# Patient Record
Sex: Female | Born: 1935 | Race: White | Hispanic: Yes | Smoking: Never smoker
Health system: Southern US, Community
[De-identification: ages and names within clinical notes are randomized; demographics above are authoritative.]

## PROBLEM LIST (undated history)

## (undated) DIAGNOSIS — J45909 Unspecified asthma, uncomplicated: Secondary | ICD-10-CM

## (undated) DIAGNOSIS — C3490 Malignant neoplasm of unspecified part of unspecified bronchus or lung: Secondary | ICD-10-CM

## (undated) DIAGNOSIS — M1990 Unspecified osteoarthritis, unspecified site: Secondary | ICD-10-CM

## (undated) DIAGNOSIS — I10 Essential (primary) hypertension: Secondary | ICD-10-CM

## (undated) HISTORY — PX: HERNIA REPAIR: SHX51

## (undated) HISTORY — DX: Essential (primary) hypertension: I10

## (undated) HISTORY — PX: APPENDECTOMY: SHX54

## (undated) HISTORY — PX: OTHER SURGICAL HISTORY: SHX169

## (undated) HISTORY — DX: Unspecified osteoarthritis, unspecified site: M19.90

---

## 2016-02-10 DIAGNOSIS — I10 Essential (primary) hypertension: Secondary | ICD-10-CM | POA: Insufficient documentation

## 2016-06-15 ENCOUNTER — Other Ambulatory Visit: Payer: Self-pay | Admitting: Nurse Practitioner

## 2016-06-15 DIAGNOSIS — E2839 Other primary ovarian failure: Secondary | ICD-10-CM

## 2016-06-15 DIAGNOSIS — Z1231 Encounter for screening mammogram for malignant neoplasm of breast: Secondary | ICD-10-CM

## 2016-07-28 ENCOUNTER — Ambulatory Visit
Admission: RE | Admit: 2016-07-28 | Discharge: 2016-07-28 | Disposition: A | Discharge status: Home / Self Care | Payer: Medicare HMO | Source: Ambulatory Visit | Attending: Nurse Practitioner | Admitting: Nurse Practitioner

## 2016-07-28 DIAGNOSIS — Z1231 Encounter for screening mammogram for malignant neoplasm of breast: Secondary | ICD-10-CM

## 2016-07-28 DIAGNOSIS — E2839 Other primary ovarian failure: Secondary | ICD-10-CM

## 2018-11-24 DIAGNOSIS — M810 Age-related osteoporosis without current pathological fracture: Secondary | ICD-10-CM | POA: Insufficient documentation

## 2019-01-31 ENCOUNTER — Emergency Department (HOSPITAL_COMMUNITY): Payer: Medicare HMO

## 2019-01-31 ENCOUNTER — Other Ambulatory Visit: Payer: Self-pay

## 2019-01-31 ENCOUNTER — Emergency Department (HOSPITAL_COMMUNITY)
Admission: EM | Admit: 2019-01-31 | Discharge: 2019-01-31 | Disposition: A | Discharge status: Home / Self Care | Payer: Medicare HMO | Attending: Emergency Medicine | Admitting: Emergency Medicine

## 2019-01-31 ENCOUNTER — Encounter (HOSPITAL_COMMUNITY): Payer: Self-pay | Admitting: Emergency Medicine

## 2019-01-31 DIAGNOSIS — J45909 Unspecified asthma, uncomplicated: Secondary | ICD-10-CM | POA: Diagnosis not present

## 2019-01-31 DIAGNOSIS — R0600 Dyspnea, unspecified: Secondary | ICD-10-CM

## 2019-01-31 DIAGNOSIS — Z20828 Contact with and (suspected) exposure to other viral communicable diseases: Secondary | ICD-10-CM | POA: Insufficient documentation

## 2019-01-31 DIAGNOSIS — R918 Other nonspecific abnormal finding of lung field: Secondary | ICD-10-CM | POA: Diagnosis not present

## 2019-01-31 DIAGNOSIS — R0602 Shortness of breath: Secondary | ICD-10-CM

## 2019-01-31 HISTORY — DX: Unspecified asthma, uncomplicated: J45.909

## 2019-01-31 LAB — URINALYSIS, ROUTINE W REFLEX MICROSCOPIC
Bilirubin Urine: NEGATIVE
Glucose, UA: NEGATIVE mg/dL
Hgb urine dipstick: NEGATIVE
Ketones, ur: NEGATIVE mg/dL
Leukocytes,Ua: NEGATIVE
Nitrite: NEGATIVE
Protein, ur: NEGATIVE mg/dL
Specific Gravity, Urine: 1.016 (ref 1.005–1.030)
pH: 7 (ref 5.0–8.0)

## 2019-01-31 LAB — CBC WITH DIFFERENTIAL/PLATELET
Abs Immature Granulocytes: 0.02 10*3/uL (ref 0.00–0.07)
Basophils Absolute: 0 10*3/uL (ref 0.0–0.1)
Basophils Relative: 1 %
Eosinophils Absolute: 0.3 10*3/uL (ref 0.0–0.5)
Eosinophils Relative: 5 %
HCT: 43.9 % (ref 36.0–46.0)
Hemoglobin: 14.1 g/dL (ref 12.0–15.0)
Immature Granulocytes: 0 %
Lymphocytes Relative: 29 %
Lymphs Abs: 1.6 10*3/uL (ref 0.7–4.0)
MCH: 29.1 pg (ref 26.0–34.0)
MCHC: 32.1 g/dL (ref 30.0–36.0)
MCV: 90.5 fL (ref 80.0–100.0)
Monocytes Absolute: 0.4 10*3/uL (ref 0.1–1.0)
Monocytes Relative: 7 %
Neutro Abs: 3.3 10*3/uL (ref 1.7–7.7)
Neutrophils Relative %: 58 %
Platelets: 300 10*3/uL (ref 150–400)
RBC: 4.85 MIL/uL (ref 3.87–5.11)
RDW: 13.9 % (ref 11.5–15.5)
WBC: 5.7 10*3/uL (ref 4.0–10.5)
nRBC: 0 % (ref 0.0–0.2)

## 2019-01-31 LAB — COMPREHENSIVE METABOLIC PANEL WITH GFR
ALT: 20 U/L (ref 0–44)
AST: 21 U/L (ref 15–41)
Albumin: 4.1 g/dL (ref 3.5–5.0)
Alkaline Phosphatase: 73 U/L (ref 38–126)
Anion gap: 10 (ref 5–15)
BUN: 19 mg/dL (ref 8–23)
CO2: 25 mmol/L (ref 22–32)
Calcium: 9.4 mg/dL (ref 8.9–10.3)
Chloride: 103 mmol/L (ref 98–111)
Creatinine, Ser: 0.81 mg/dL (ref 0.44–1.00)
GFR calc Af Amer: >60 mL/min
GFR calc non Af Amer: >60 mL/min
Glucose, Bld: 98 mg/dL (ref 70–99)
Potassium: 3.9 mmol/L (ref 3.5–5.1)
Sodium: 138 mmol/L (ref 135–145)
Total Bilirubin: 0.9 mg/dL (ref 0.3–1.2)
Total Protein: 7.2 g/dL (ref 6.5–8.1)

## 2019-01-31 LAB — BRAIN NATRIURETIC PEPTIDE: B Natriuretic Peptide: 42.7 pg/mL (ref 0.0–100.0)

## 2019-01-31 LAB — SARS CORONAVIRUS 2 BY RT PCR (HOSPITAL ORDER, PERFORMED IN ~~LOC~~ HOSPITAL LAB): SARS Coronavirus 2: NEGATIVE

## 2019-01-31 LAB — LACTIC ACID, PLASMA: Lactic Acid, Venous: 0.6 mmol/L (ref 0.5–1.9)

## 2019-01-31 LAB — TROPONIN I (HIGH SENSITIVITY)
Troponin I (High Sensitivity): 6 ng/L (ref <18)
Troponin I (High Sensitivity): 6 ng/L

## 2019-01-31 MED ORDER — IOHEXOL 350 MG/ML SOLN
100.0000 mL | Freq: Once | INTRAVENOUS | Status: AC | PRN
  Administered 2019-01-31: 18:00:00 100 mL via INTRAVENOUS

## 2019-01-31 MED ORDER — AMLODIPINE BESYLATE 5 MG PO TABS
2.5000 mg | ORAL_TABLET | Freq: Once | ORAL | Status: AC
  Administered 2019-01-31: 2.5 mg via ORAL
  Filled 2019-01-31: qty 1

## 2019-01-31 MED ORDER — SODIUM CHLORIDE (PF) 0.9 % IJ SOLN
INTRAMUSCULAR | Status: AC
  Administered 2019-01-31: 17:00:00
  Filled 2019-01-31: qty 50

## 2019-01-31 MED ORDER — IOHEXOL 300 MG/ML  SOLN: 100.0000 mL | Freq: Once | INTRAMUSCULAR | Status: DC | PRN

## 2019-01-31 MED ORDER — ALBUTEROL SULFATE HFA 108 (90 BASE) MCG/ACT IN AERS
2.0000 | INHALATION_SPRAY | Freq: Once | RESPIRATORY_TRACT | Status: AC
  Administered 2019-01-31: 2 via RESPIRATORY_TRACT
  Filled 2019-01-31: qty 6.7

## 2019-01-31 NOTE — ED Provider Notes (Signed)
[facility] DEPT Provider Note   CSN: [id number] Arrival date & time: 01/31/19  1054     History   Chief Complaint Chief Complaint  Patient presents with  . Shortness of Breath    HPI Sandra Cooper is a 85 y.o. female.     85 year old female with prior medical history as detailed below presents for evaluation of shortness of breath.  Patient reports approximately 1 week of feeling winded with exertion.  She denies associated chest pain or fever.  She denies cough.  She denies known exposure to other sick contacts.  She appears comfortable at time of my exam.  She apparently saw her primary care provider this morning was sent to the ED for evaluation.  The history is provided by the patient and medical records.  Shortness of Breath Severity:  Mild Onset quality:  Gradual Duration:  1 week Timing:  Constant Progression:  Waxing and waning Chronicity:  New Relieved by:  Nothing Worsened by:  Nothing Ineffective treatments:  None tried Associated symptoms: no chest pain and no fever     Past Medical History:  Diagnosis Date  . Asthma     There are no active problems to display for this patient.   History reviewed. No pertinent surgical history.   OB History   No obstetric history on file.      Home Medications    Prior to Admission medications   Not on File    Family History History reviewed. No pertinent family history.  Social History Social History   Tobacco Use  . Smoking status: Never Smoker  Substance Use Topics  . Alcohol use: Never    Frequency: Never  . Drug use: Never     Allergies   Patient has no allergy information on record.   Review of Systems Review of Systems  Constitutional: Negative for fever.  Respiratory: Positive for shortness of breath.   Cardiovascular: Negative for chest pain.  All other systems reviewed and are negative.    Physical Exam Updated Vital Signs BP (!) 157/96   Pulse 70    Temp 97.9 F (36.6 C) (Oral)   Resp 19   Ht 5' (1.524 m)   Wt 54.4 kg   SpO2 95%   BMI 23.44 kg/m   Physical Exam Vitals signs and nursing note reviewed.  Constitutional:      General: She is not in acute distress.    Appearance: She is well-developed.  HENT:     Head: Normocephalic and atraumatic.  Eyes:     Conjunctiva/sclera: Conjunctivae normal.     Pupils: Pupils are equal, round, and reactive to light.  Neck:     Musculoskeletal: Normal range of motion and neck supple.  Cardiovascular:     Rate and Rhythm: Normal rate and regular rhythm.     Heart sounds: Normal heart sounds.  Pulmonary:     Effort: Pulmonary effort is normal. No respiratory distress.     Breath sounds: Normal breath sounds.  Abdominal:     General: There is no distension.     Palpations: Abdomen is soft.     Tenderness: There is no abdominal tenderness.  Musculoskeletal: Normal range of motion.        General: No deformity.  Skin:    General: Skin is warm and dry.  Neurological:     General: No focal deficit present.     Mental Status: She is alert and oriented to person, place, and time.  ED Treatments / Results  Labs (all labs ordered are listed, but only abnormal results are displayed) Labs Reviewed  SARS CORONAVIRUS 2 (HOSPITAL ORDER, [facility])  CULTURE, BLOOD (ROUTINE X 2)  CULTURE, BLOOD (ROUTINE X 2)  COMPREHENSIVE METABOLIC PANEL  BRAIN NATRIURETIC PEPTIDE  CBC WITH DIFFERENTIAL/PLATELET  URINALYSIS, ROUTINE W REFLEX MICROSCOPIC  LACTIC ACID, PLASMA  TROPONIN I (HIGH SENSITIVITY)  TROPONIN I (HIGH SENSITIVITY)    EKG EKG Interpretation  Date/Time:  Tuesday January 31 2019 11:44:35 EDT Ventricular Rate:  63 PR Interval:    QRS Duration: 73 QT Interval:  449 QTC Calculation: 460 R Axis:   -6 Text Interpretation:  Sinus rhythm Prolonged PR interval Low voltage, precordial leads Abnormal R-wave progression, early transition Minimal ST  elevation, inferior leads Confirmed by Dene Gentry [phone]) on 01/31/2019 11:56:51 AM   Radiology Dg Chest Port 1 View  Result Date: 01/31/2019 CLINICAL DATA:  Increasing shortness of breath EXAM: PORTABLE CHEST 1 VIEW COMPARISON:  10/21/2015 FINDINGS: Cardiomegaly and aortic atherosclerosis. Mild scarring or atelectasis of the bilateral lung bases. There is a rounded nodular opacity over the left lung base, which does not appear to reflect a nipple shadow, measuring 2.0 cm. The visualized skeletal structures are unremarkable. IMPRESSION: 1. Mild scarring or atelectasis of the bilateral lung bases. No acute appearing airspace opacity. 2. There is a rounded nodular opacity over the left lung base, which does not appear to reflect a nipple shadow, measuring 2.0 cm and new compared to prior examination. Recommend CT to further evaluate, which can be performed on a nonemergent basis. 3.  Cardiomegaly. Electronically Signed   By: Eddie Candle M.D.   On: 01/31/2019 12:40    Procedures Procedures (including critical care time)  Medications Ordered in ED Medications - No data to display   Initial Impression / Assessment and Plan / ED Course  I have reviewed the triage vital signs and the nursing notes.  Pertinent labs & imaging results that were available during my care of the patient were reviewed by me and considered in my medical decision making (see chart for details).        MDM  Screen complete  [name] was evaluated in Emergency Department on 01/31/2019 for the symptoms described in the history of present illness. She was evaluated in the context of the global COVID-19 pandemic, which necessitated consideration that the patient might be at risk for infection with the SARS-CoV-2 virus that causes COVID-19. Institutional protocols and algorithms that pertain to the evaluation of patients at risk for COVID-19 are in a state of rapid change based on information released by regulatory bodies  including the CDC and federal and state organizations. These policies and algorithms were followed during the patient's care in the ED.  Patient is presenting for evaluation of dyspnea.   Initial labs / CXR reassuring.   CTA Chest (RO PE) pending at time of signout.   Dr. Sedonia Small aware of pending CT and disposition.    Final Clinical Impressions(s) / ED Diagnoses   Final diagnoses:  Dyspnea, unspecified type    ED Discharge Orders    None       Valarie Merino, MD 01/31/19 [phone]

## 2019-01-31 NOTE — ED Notes (Signed)
Daughter taking pt home, pt states she is in no pain and no longer has shortness of breath.

## 2019-01-31 NOTE — ED Notes (Signed)
Patient ambulated with pulse ox, saturation ranged from 92-97% RA, no acute distress, steady gait noted, no assistance needed.

## 2019-01-31 NOTE — Discharge Instructions (Addendum)
You were evaluated in the Emergency Department and after careful evaluation, we did not find any emergent condition requiring admission or further testing in the hospital.  We discussed the abnormal findings on your CT scan today.  We have contacted the oncologists to set up for close follow-up.  We have also provided you with their contact information.  Please return to the Emergency Department if you experience any worsening of your condition.  We encourage you to follow up with a primary care provider.  Thank you for allowing [place] to be a part of your care.

## 2019-01-31 NOTE — ED Triage Notes (Signed)
Patient arrived by self from doctors office. Patient c/o SOB. Patient has hx of asthma.   Patient stated they haven't been around anyone with COVID 19.   Patient appears comfortable and not in any distress.

## 2019-01-31 NOTE — ED Provider Notes (Signed)
  Provider Note MRN:  [id number]  Arrival date & time: 01/31/19    ED Course and Medical Decision Making  Assumed care from Dr. Francia Greaves at shift change.  Awaiting CTA to exclude PE.  Favoring asthma with noncompliance of meds.  Well-appearing, candidate for discharge if CT is negative.  CT is without evidence of pulmonary embolism.  CT does show question of pulmonary malignancy.  This finding discussed with patient and patient's family.  I also consulted oncology and Dr. Malachy Mood and Dr. Earlie Server have been messaged and they will facilitate close outpatient follow-up.  Patient with saturations in the low 90s while sitting at rest but without any acute distress, no increased work of breathing.  She was ambulated with pulse ox and again did very well, minimally symptomatic, oxygen saturations above 92% the entire time.  She is appropriate for discharge with close follow-up.  Final Clinical Impressions(s) / ED Diagnoses     ICD-10-CM   1. Dyspnea, unspecified type  R06.00   2. SOB (shortness of breath)  R06.02   3. Abnormal CT scan of lung  R91.8     ED Discharge Orders    None        Discharge Instructions     You were evaluated in the Emergency Department and after careful evaluation, we did not find any emergent condition requiring admission or further testing in the hospital.  We discussed the abnormal findings on your CT scan today.  We have contacted the oncologists to set up for close follow-up.  We have also provided you with their contact information.  Please return to the Emergency Department if you experience any worsening of your condition.  We encourage you to follow up with a primary care provider.  Thank you for allowing [place] to be a part of your care.      Barth Kirks. Sedonia Small, [place] [email or web address]    Maudie Flakes, MD 01/31/19 2125

## 2019-01-31 NOTE — ED Notes (Signed)
Patient transported to CT 

## 2019-01-31 NOTE — ED Notes (Signed)
Bedside commode has been placed. Pt is aware that MD needs UA sample.

## 2019-02-01 ENCOUNTER — Telehealth: Payer: Self-pay | Admitting: Oncology

## 2019-02-01 ENCOUNTER — Encounter: Payer: Self-pay | Admitting: *Deleted

## 2019-02-01 DIAGNOSIS — R918 Other nonspecific abnormal finding of lung field: Secondary | ICD-10-CM

## 2019-02-01 NOTE — Progress Notes (Signed)
Oncology Nurse Navigator Documentation  Oncology Nurse Navigator Flowsheets 02/01/2019  Navigator Location [facility]  Navigator Encounter Type Other/per Dr. Benay Spice, I completed referral to [facility] and will update their office.   Barriers/Navigation Needs Coordination of Care  Interventions Coordination of Care  Coordination of Care Other  Acuity Level 1  Time Spent with Patient 15

## 2019-02-01 NOTE — Telephone Encounter (Signed)
Returned call to patient dtr leslie at [phone]. Per [name] patient was seen in ED yesterday and they were told to call for appointment with Dr. Benay Spice re lung mass asap.  Per [name] they are already set up for an appointment to see the surgeon 9/4, however they would much rather see Dr. Benay Spice 1st even if by virtual visit before they start talking about surgery. Message to Dr. Veatrice Kells patient scheduler/lung coordinator re above message. [name] can be reached at [phone].

## 2019-02-03 ENCOUNTER — Other Ambulatory Visit: Payer: Self-pay | Admitting: *Deleted

## 2019-02-03 ENCOUNTER — Institutional Professional Consult (permissible substitution): Payer: Medicare HMO | Admitting: Thoracic Surgery (Cardiothoracic Vascular Surgery)

## 2019-02-03 ENCOUNTER — Other Ambulatory Visit: Payer: Self-pay

## 2019-02-03 ENCOUNTER — Encounter: Payer: Self-pay | Admitting: Thoracic Surgery (Cardiothoracic Vascular Surgery)

## 2019-02-03 VITALS — BP 158/79 | HR 71 | Temp 97.7°F | Resp 16 | Ht 60.0 in | Wt 110.0 lb

## 2019-02-03 DIAGNOSIS — E041 Nontoxic single thyroid nodule: Secondary | ICD-10-CM

## 2019-02-03 DIAGNOSIS — R911 Solitary pulmonary nodule: Secondary | ICD-10-CM

## 2019-02-03 DIAGNOSIS — R918 Other nonspecific abnormal finding of lung field: Secondary | ICD-10-CM

## 2019-02-03 NOTE — Progress Notes (Signed)
[address]       [place] [address]             [phone]                    Tracyann Tutton [place] Medical Record #[id number] Date of Birth: [birth year]  Referring: [name], MD Primary Care: [name], MD Primary Cardiologist: No primary care provider on file.  Chief Complaint:    Chief Complaint  Patient presents with   Lung Mass    Surgery eval, Chest CTA 01/31/19    History of Present Illness:    Sandra Cooper 85 y.o. female referred by Dr. Nelva Bush for evaluation of a left lower lobe pulmonary nodule that was found incidentally cross-sectional emergency department visit.  Per her report she had several weeks of a cough and some dyspnea on the 2 to concern for COVID infections she was instructed by her PCP to present to the emergency department for rapid screen.  This point she underwent a chest x-ray and a CT scan which did show this left lower lobe nodule.  In regards to her symptoms she does still have some exertional dyspnea.  She has regained some weight over the last several months.  Of note her husband died 1 year ago from lung cancer informing us that she did not lose she denies any neurologic symptoms but has been using a cane due to some occasional dizziness and gait instability.  Per her daughter she is able to walk 20 minutes every morning on the treadmill.    Smoking Hx: Lifelong non-smoker but significant exposure to secondhand smoke.  Her husband died 1 year ago lung cancer.  They were married for over 55 years.   Zubrod Score: At the time of surgery this patients most appropriate activity status/level should be described as: [x]     0    Normal activity, no symptoms []     1    Restricted in physical strenuous activity but ambulatory, able to do out light work []     2    Ambulatory and capable of self care, unable to do work activities, up and about               >50 % of waking hours                              []     3    Only  limited self care, in bed greater than 50% of waking hours []     4    Completely disabled, no self care, confined to bed or chair []     5    Moribund   Past Medical History:  Diagnosis Date   Arthritis    Asthma    Hypertension     Past Surgical History:  Procedure Laterality Date   APPENDECTOMY     HERNIA REPAIR      No family history on file.   Social History   Tobacco Use  Smoking Status Never Smoker    Social History   Substance and Sexual Activity  Alcohol Use Never   Frequency: Never     Allergies  Allergen Reactions   Fluoxetine Swelling    Current Outpatient Medications  Medication Sig Dispense Refill   albuterol (VENTOLIN HFA) 108 (90 Base) MCG/ACT inhaler Inhale 1 puff into the lungs every 6 (six) hours as  needed for wheezing or shortness of breath.     amLODipine (NORVASC) 5 MG tablet Take 5 mg by mouth daily.     ibuprofen (ADVIL) 200 MG tablet Take 200 mg by mouth daily as needed for headache.     losartan (COZAAR) 100 MG tablet Take 100 mg by mouth every evening.     mirtazapine (REMERON) 15 MG tablet Take 15 mg by mouth at bedtime.     Multiple Vitamin (MULTIVITAMIN) tablet Take 1 tablet by mouth daily.     No current facility-administered medications for this visit.     Review of Systems  Constitutional: Positive for weight loss. Negative for chills, diaphoresis, fever and malaise/fatigue.  HENT: Negative.   Eyes: Negative.   Respiratory: Positive for cough, shortness of breath and wheezing.   Cardiovascular: Negative for chest pain and palpitations.  Gastrointestinal: Negative.   Musculoskeletal: Negative.   Skin: Negative.   Neurological: Positive for dizziness.  Psychiatric/Behavioral: Negative.      PHYSICAL EXAMINATION: Ht 5' (1.524 m)    BMI 23.44 kg/m  Physical Exam  Constitutional: She is oriented to person, place, and time. She appears well-developed and well-nourished. No distress.  HENT:  Head:  Normocephalic and atraumatic.  Eyes: Conjunctivae and EOM are normal.  Neck: Normal range of motion. Neck supple. No tracheal deviation present.  Cardiovascular: Normal rate and regular rhythm.  No murmur heard. Respiratory: Effort normal.  Distant breath sounds Faint expiratory wheezes  GI: Soft.  Musculoskeletal: Normal range of motion.     Comments: Uses a cane for ambulation  Lymphadenopathy:    She has no cervical adenopathy.  Neurological: She is alert and oriented to person, place, and time.  Skin: Skin is warm and dry. She is not diaphoretic.    Diagnostic Studies & Laboratory data:     Recent Radiology Findings:   Ct Angio Chest Pe W And/or Wo Contrast  Result Date: 01/31/2019 CLINICAL DATA:  Shortness of breath. EXAM: CT ANGIOGRAPHY CHEST WITH CONTRAST TECHNIQUE: Multidetector CT imaging of the chest was performed using the standard protocol during bolus administration of intravenous contrast. Multiplanar CT image reconstructions and MIPs were obtained to evaluate the vascular anatomy. CONTRAST:  80 cc OMNIPAQUE IOHEXOL 350 MG/ML SOLN COMPARISON:  Chest x-ray dated 01/31/2019 FINDINGS: Cardiovascular: Satisfactory opacification of the pulmonary arteries to the segmental level. No evidence of pulmonary embolism. Normal heart size. No pericardial effusion. Extensive aortic atherosclerosis. Mediastinum/Nodes: 2.6 cm nodule in the left lobe of the thyroid gland. Right lobe appears normal. No axillary or mediastinal or hilar adenopathy. Small hiatal hernia. Trachea is normal. Lungs/Pleura: There is a poorly defined area of abnormal density in the left lower lobe laterally measuring 17 x 25 x 16 mm. There is suggestion of slight spiculation on several images. There is an area of what may be postobstructive atelectasis beyond the primary density. There is slight atelectasis at the right lung base posterolaterally. No effusions. Upper Abdomen: No acute abnormality. Musculoskeletal: No chest  wall abnormality. No acute or significant osseous findings. Review of the MIP images confirms the above findings. IMPRESSION: 1. No pulmonary emboli. 2. Ill-defined area of abnormal density in the lateral aspect of the left lower lobe with suggestion of slight spiculation on several images. This could represent an area of postobstructive atelectasis. The finding is worrisome for malignancy. This correlates with the nodular density seen on the chest x-ray dated 01/31/2019. 3. 2.6 cm nodule in the left lobe of the thyroid gland. Thyroid ultrasound recommended for  further evaluation. Aortic Atherosclerosis (ICD10-I70.0). Electronically Signed   By: Lorriane Shire M.D.   On: 01/31/2019 18:01   Dg Chest Port 1 View  Result Date: 01/31/2019 CLINICAL DATA:  Increasing shortness of breath EXAM: PORTABLE CHEST 1 VIEW COMPARISON:  10/21/2015 FINDINGS: Cardiomegaly and aortic atherosclerosis. Mild scarring or atelectasis of the bilateral lung bases. There is a rounded nodular opacity over the left lung base, which does not appear to reflect a nipple shadow, measuring 2.0 cm. The visualized skeletal structures are unremarkable. IMPRESSION: 1. Mild scarring or atelectasis of the bilateral lung bases. No acute appearing airspace opacity. 2. There is a rounded nodular opacity over the left lung base, which does not appear to reflect a nipple shadow, measuring 2.0 cm and new compared to prior examination. Recommend CT to further evaluate, which can be performed on a nonemergent basis. 3.  Cardiomegaly. Electronically Signed   By: Eddie Candle M.D.   On: 01/31/2019 12:40       I have independently reviewed the above radiology studies  and reviewed the findings with the patient.   Recent Lab Findings: Lab Results  Component Value Date   WBC 5.7 01/31/2019   HGB 14.1 01/31/2019   HCT 43.9 01/31/2019   PLT 300 01/31/2019   GLUCOSE 98 01/31/2019   ALT 20 01/31/2019   AST 21 01/31/2019   NA 138 01/31/2019   K 3.9  01/31/2019   CL 103 01/31/2019   CREATININE 0.81 01/31/2019   BUN 19 01/31/2019   CO2 25 01/31/2019     PFTs: Pending  Problem List: 2.5 cm left lower lobe pulmonary nodule. 2.6 cm left thyroid lobe nodule.  Assessment / Plan:   -year-old female with a 2.5 cm left lower lobe nodule that is concerning for malignancy. Discussion was had with her and her 3 children 2 of which were on the cell in regards to next steps for work-up and diagnosis of this nodule.  Recommended that she undergo a PET/CT along with tissue diagnosis.  Options included a CT-guided biopsy versus navigational bronchoscopy versus surgical biopsy.  The family is unsure in regards to what type of biopsy given home will contact us to let us forward.  We will also obtain pulmonary function testing as well as an MRI brain given her history of dizziness and gait instability.  My impression that the family is leaning towards undergoing a surgical biopsy with a completion lobectomy at the same time however Mrs. Hogate went hesitant.  I am reassured in her functional status to be able to recover well from the operation.  Once we have resulted the above studies I will contact order with the results.     I  spent 55 minutes with  the patient face to face and greater then 50% of the time was spent in counseling and coordination of care.    Lajuana Matte 02/03/2019 11:31 AM

## 2019-02-05 LAB — CULTURE, BLOOD (ROUTINE X 2)
Culture: NO GROWTH
Culture: NO GROWTH

## 2019-02-07 ENCOUNTER — Other Ambulatory Visit: Payer: Self-pay | Admitting: Thoracic Surgery (Cardiothoracic Vascular Surgery)

## 2019-02-07 DIAGNOSIS — R918 Other nonspecific abnormal finding of lung field: Secondary | ICD-10-CM

## 2019-02-08 ENCOUNTER — Other Ambulatory Visit (HOSPITAL_COMMUNITY): Payer: Self-pay | Admitting: Respiratory Therapy

## 2019-02-13 ENCOUNTER — Inpatient Hospital Stay (HOSPITAL_COMMUNITY): Admission: RE | Admit: 2019-02-13 | Payer: Medicare HMO | Source: Ambulatory Visit

## 2019-02-15 ENCOUNTER — Ambulatory Visit (HOSPITAL_COMMUNITY)
Admission: RE | Admit: 2019-02-15 | Discharge: 2019-02-15 | Disposition: A | Discharge status: Home / Self Care | Payer: Medicare HMO | Source: Ambulatory Visit | Attending: Thoracic Surgery (Cardiothoracic Vascular Surgery) | Admitting: Thoracic Surgery (Cardiothoracic Vascular Surgery)

## 2019-02-15 ENCOUNTER — Other Ambulatory Visit: Payer: Self-pay

## 2019-02-15 DIAGNOSIS — J439 Emphysema, unspecified: Secondary | ICD-10-CM | POA: Insufficient documentation

## 2019-02-15 DIAGNOSIS — I2510 Atherosclerotic heart disease of native coronary artery without angina pectoris: Secondary | ICD-10-CM | POA: Diagnosis not present

## 2019-02-15 DIAGNOSIS — R911 Solitary pulmonary nodule: Secondary | ICD-10-CM | POA: Diagnosis not present

## 2019-02-15 LAB — GLUCOSE, CAPILLARY: Glucose-Capillary: 80 mg/dL (ref 70–99)

## 2019-02-15 MED ORDER — FLUDEOXYGLUCOSE F - 18 (FDG) INJECTION
5.4000 | Freq: Once | INTRAVENOUS | Status: AC
  Administered 2019-02-15: 5.4 via INTRAVENOUS

## 2019-02-16 ENCOUNTER — Other Ambulatory Visit: Payer: Self-pay

## 2019-02-16 ENCOUNTER — Ambulatory Visit (HOSPITAL_COMMUNITY)
Admission: RE | Admit: 2019-02-16 | Discharge: 2019-02-16 | Disposition: A | Discharge status: Home / Self Care | Payer: Medicare HMO | Source: Ambulatory Visit | Attending: Thoracic Surgery (Cardiothoracic Vascular Surgery) | Admitting: Thoracic Surgery (Cardiothoracic Vascular Surgery)

## 2019-02-16 DIAGNOSIS — R911 Solitary pulmonary nodule: Secondary | ICD-10-CM | POA: Diagnosis not present

## 2019-02-16 LAB — PULMONARY FUNCTION TEST
DL/VA % pred: 87 %
DL/VA: 3.68 ml/min/mmHg/L
DLCO cor % pred: 77 %
DLCO cor: 11.98 ml/min/mmHg
DLCO unc % pred: 79 %
DLCO unc: 12.23 ml/min/mmHg
FEF 25-75 Post: 1.03 L/sec
FEF 25-75 Pre: 0.66 L/sec
FEF2575-%Change-Post: 55 %
FEF2575-%Pred-Post: 126 %
FEF2575-%Pred-Pre: 80 %
FEV1-%Change-Post: 11 %
FEV1-%Pred-Post: 102 %
FEV1-%Pred-Pre: 92 %
FEV1-Post: 1.3 L
FEV1-Pre: 1.17 L
FEV1FVC-%Change-Post: 8 %
FEV1FVC-%Pred-Pre: 94 %
FEV6-%Change-Post: 2 %
FEV6-%Pred-Post: 107 %
FEV6-%Pred-Pre: 104 %
FEV6-Post: 1.75 L
FEV6-Pre: 1.7 L
FEV6FVC-%Pred-Post: 107 %
FEV6FVC-%Pred-Pre: 107 %
FVC-%Change-Post: 2 %
FVC-%Pred-Post: 99 %
FVC-%Pred-Pre: 97 %
FVC-Post: 1.75 L
FVC-Pre: 1.7 L
Post FEV1/FVC ratio: 75 %
Post FEV6/FVC ratio: 100 %
Pre FEV1/FVC ratio: 69 %
Pre FEV6/FVC Ratio: 100 %
RV % pred: 268 %
RV: 6.07 L
TLC % pred: 183 %
TLC: 7.88 L

## 2019-02-16 MED ORDER — ALBUTEROL SULFATE (2.5 MG/3ML) 0.083% IN NEBU
2.5000 mg | INHALATION_SOLUTION | Freq: Once | RESPIRATORY_TRACT | Status: AC
  Administered 2019-02-16: 13:00:00 2.5 mg via RESPIRATORY_TRACT

## 2019-02-20 ENCOUNTER — Other Ambulatory Visit (HOSPITAL_COMMUNITY)
Admission: RE | Admit: 2019-02-20 | Discharge: 2019-02-20 | Disposition: A | Discharge status: Home / Self Care | Payer: Medicare HMO | Source: Ambulatory Visit | Attending: Thoracic Surgery (Cardiothoracic Vascular Surgery) | Admitting: Thoracic Surgery (Cardiothoracic Vascular Surgery)

## 2019-02-20 ENCOUNTER — Ambulatory Visit (HOSPITAL_COMMUNITY): Payer: Medicare HMO

## 2019-02-20 DIAGNOSIS — Z20828 Contact with and (suspected) exposure to other viral communicable diseases: Secondary | ICD-10-CM | POA: Insufficient documentation

## 2019-02-20 DIAGNOSIS — Z01812 Encounter for preprocedural laboratory examination: Secondary | ICD-10-CM | POA: Insufficient documentation

## 2019-02-22 ENCOUNTER — Other Ambulatory Visit: Payer: Self-pay | Admitting: Radiology

## 2019-02-22 ENCOUNTER — Telehealth: Payer: Self-pay | Admitting: *Deleted

## 2019-02-22 ENCOUNTER — Other Ambulatory Visit: Payer: Self-pay | Admitting: Physician Assistant

## 2019-02-22 LAB — NOVEL CORONAVIRUS, NAA (HOSP ORDER, SEND-OUT TO REF LAB; TAT 18-24 HRS): SARS-CoV-2, NAA: NOT DETECTED

## 2019-02-22 NOTE — Telephone Encounter (Signed)
Dr. Kipp Brood called Sandra Cooper per her request.  She reported that her blood pressures are running high.  He has ordered that her scheduled IR CT lung BX be cancelled for tomorrow, 02/23/19.  He has instructed her to see her PCP for BP management.  I called central scheduling and was assisted in cancelling her bx.

## 2019-02-23 ENCOUNTER — Ambulatory Visit (HOSPITAL_COMMUNITY): Admission: RE | Admit: 2019-02-23 | Payer: Medicare HMO | Source: Ambulatory Visit

## 2019-02-23 ENCOUNTER — Telehealth: Payer: Self-pay

## 2019-02-23 ENCOUNTER — Encounter (HOSPITAL_COMMUNITY): Payer: Self-pay

## 2019-02-23 NOTE — Telephone Encounter (Signed)
Donnella Sham, RN  Lajuana Matte, MD        You're welcome! I also want you to be aware, she called back and is wanting to know the results of her Thyroid and PET scans.   Thanks,   Caryl Pina   Previous Messages  ----- Message -----  From: Lajuana Matte, MD  Sent: 02/22/2019 12:41 PM EDT  To: Donnella Sham, RN  Subject: RE: Concerns about blood pressure and biopsy   thanks  ----- Message -----  From: Donnella Sham, RN  Sent: 02/22/2019 12:18 PM EDT  To: Lajuana Matte, MD  Subject: Concerns about blood pressure and biopsy     Hey,   She called the office concerned about her blood pressure (which has been ongoing for a couple of weeks) and having the lung biopsy done tomorrow. She stated that last night her blood pressure woke her up in the middle of the night and she checked it, 223/121. She did say she has called her PCP in regards to her blood pressure. She was not sure if she should go through with the biopsy with her BP being that high. I did tell her that they would be checking her blood pressure before and during the biopsy and could give her medication to help bring it down, and that if it was too high and unsafe they would cancel the biopsy then.   She specifically stated that she would like to speak with you. Please advise.   Her number is [phone] in case you want to contact her.    Thanks,   Caryl Pina

## 2019-02-24 ENCOUNTER — Encounter: Payer: Self-pay | Admitting: *Deleted

## 2019-02-24 ENCOUNTER — Other Ambulatory Visit: Payer: Self-pay | Admitting: *Deleted

## 2019-02-24 DIAGNOSIS — R918 Other nonspecific abnormal finding of lung field: Secondary | ICD-10-CM

## 2019-02-24 NOTE — Progress Notes (Signed)
Received a call from [name]'s daughter inquiring about her LUNG NB hat had to be cancelled due to high blood pressures.  She has been to her PCP, her bp med has ben adjusted.  He will give her an axiety med to take before the biopsy.  I called the scheduler to have it rescheduled and make note that the daughter, [name] wants to be called with all the arrangements.  She will have to have a repeat COVID test, the order has been entered and the POC will be notified of same when her NB has been rescheduled.

## 2019-03-06 ENCOUNTER — Other Ambulatory Visit: Payer: Self-pay

## 2019-03-06 ENCOUNTER — Other Ambulatory Visit (HOSPITAL_COMMUNITY)
Admission: RE | Admit: 2019-03-06 | Discharge: 2019-03-06 | Disposition: A | Discharge status: Home / Self Care | Payer: Medicare HMO | Source: Ambulatory Visit | Attending: Thoracic Surgery (Cardiothoracic Vascular Surgery) | Admitting: Thoracic Surgery (Cardiothoracic Vascular Surgery)

## 2019-03-06 DIAGNOSIS — Z01812 Encounter for preprocedural laboratory examination: Secondary | ICD-10-CM | POA: Diagnosis present

## 2019-03-06 DIAGNOSIS — Z20828 Contact with and (suspected) exposure to other viral communicable diseases: Secondary | ICD-10-CM | POA: Diagnosis not present

## 2019-03-07 LAB — NOVEL CORONAVIRUS, NAA (HOSP ORDER, SEND-OUT TO REF LAB; TAT 18-24 HRS): SARS-CoV-2, NAA: NOT DETECTED

## 2019-03-08 ENCOUNTER — Other Ambulatory Visit: Payer: Self-pay | Admitting: Student

## 2019-03-08 ENCOUNTER — Other Ambulatory Visit: Payer: Self-pay | Admitting: Physician Assistant

## 2019-03-09 ENCOUNTER — Ambulatory Visit (HOSPITAL_COMMUNITY): Payer: Medicare HMO

## 2019-03-09 ENCOUNTER — Encounter (HOSPITAL_COMMUNITY): Payer: Self-pay

## 2019-03-09 ENCOUNTER — Other Ambulatory Visit: Payer: Self-pay

## 2019-03-09 ENCOUNTER — Ambulatory Visit (HOSPITAL_COMMUNITY)
Admission: RE | Admit: 2019-03-09 | Discharge: 2019-03-09 | Disposition: A | Discharge status: Home / Self Care | Payer: Medicare HMO | Source: Ambulatory Visit | Attending: Thoracic Surgery (Cardiothoracic Vascular Surgery) | Admitting: Thoracic Surgery (Cardiothoracic Vascular Surgery)

## 2019-03-09 ENCOUNTER — Inpatient Hospital Stay (HOSPITAL_COMMUNITY)
Admission: AD | Admit: 2019-03-09 | Discharge: 2019-03-15 | DRG: 189 | Disposition: A | Discharge status: Home Health | Payer: Medicare HMO | Source: Ambulatory Visit | Attending: Internal Medicine | Admitting: Internal Medicine

## 2019-03-09 VITALS — BP 117/66 | HR 86 | Temp 98.0°F | Resp 18 | Wt 107.5 lb

## 2019-03-09 DIAGNOSIS — D62 Acute posthemorrhagic anemia: Secondary | ICD-10-CM | POA: Diagnosis present

## 2019-03-09 DIAGNOSIS — Z20828 Contact with and (suspected) exposure to other viral communicable diseases: Secondary | ICD-10-CM | POA: Diagnosis present

## 2019-03-09 DIAGNOSIS — J9601 Acute respiratory failure with hypoxia: Principal | ICD-10-CM | POA: Diagnosis present

## 2019-03-09 DIAGNOSIS — R042 Hemoptysis: Secondary | ICD-10-CM | POA: Diagnosis present

## 2019-03-09 DIAGNOSIS — I10 Essential (primary) hypertension: Secondary | ICD-10-CM | POA: Diagnosis present

## 2019-03-09 DIAGNOSIS — R918 Other nonspecific abnormal finding of lung field: Secondary | ICD-10-CM

## 2019-03-09 DIAGNOSIS — R0600 Dyspnea, unspecified: Secondary | ICD-10-CM | POA: Diagnosis present

## 2019-03-09 DIAGNOSIS — Z79899 Other long term (current) drug therapy: Secondary | ICD-10-CM

## 2019-03-09 DIAGNOSIS — F17210 Nicotine dependence, cigarettes, uncomplicated: Secondary | ICD-10-CM | POA: Diagnosis present

## 2019-03-09 DIAGNOSIS — J45909 Unspecified asthma, uncomplicated: Secondary | ICD-10-CM | POA: Diagnosis present

## 2019-03-09 DIAGNOSIS — Z7982 Long term (current) use of aspirin: Secondary | ICD-10-CM

## 2019-03-09 DIAGNOSIS — R0902 Hypoxemia: Secondary | ICD-10-CM

## 2019-03-09 DIAGNOSIS — C3432 Malignant neoplasm of lower lobe, left bronchus or lung: Secondary | ICD-10-CM | POA: Diagnosis present

## 2019-03-09 LAB — CBC
HCT: 46.6 % — ABNORMAL HIGH (ref 36.0–46.0)
Hemoglobin: 15.2 g/dL — ABNORMAL HIGH (ref 12.0–15.0)
MCH: 29.1 pg (ref 26.0–34.0)
MCHC: 32.6 g/dL (ref 30.0–36.0)
MCV: 89.3 fL (ref 80.0–100.0)
Platelets: 336 10*3/uL (ref 150–400)
RBC: 5.22 MIL/uL — ABNORMAL HIGH (ref 3.87–5.11)
RDW: 13.6 % (ref 11.5–15.5)
WBC: 5.6 10*3/uL (ref 4.0–10.5)
nRBC: 0 % (ref 0.0–0.2)

## 2019-03-09 LAB — PROTIME-INR
INR: 0.9 (ref 0.8–1.2)
Prothrombin Time: 12.5 seconds (ref 11.4–15.2)

## 2019-03-09 MED ORDER — KETOROLAC TROMETHAMINE 30 MG/ML IJ SOLN
INTRAMUSCULAR | Status: AC
  Filled 2019-03-09: qty 1

## 2019-03-09 MED ORDER — MIDAZOLAM HCL 2 MG/2ML IJ SOLN
INTRAMUSCULAR | Status: AC
  Filled 2019-03-09: qty 2

## 2019-03-09 MED ORDER — BISACODYL 5 MG PO TBEC: 5.0000 mg | DELAYED_RELEASE_TABLET | Freq: Every day | ORAL | Status: DC | PRN

## 2019-03-09 MED ORDER — HYDROCODONE-ACETAMINOPHEN 5-325 MG PO TABS: 1.0000 | ORAL_TABLET | ORAL | Status: DC | PRN

## 2019-03-09 MED ORDER — ACETAMINOPHEN 650 MG RE SUPP: 650.0000 mg | Freq: Four times a day (QID) | RECTAL | Status: DC | PRN

## 2019-03-09 MED ORDER — FENTANYL CITRATE (PF) 100 MCG/2ML IJ SOLN
INTRAMUSCULAR | Status: AC
  Filled 2019-03-09: qty 2

## 2019-03-09 MED ORDER — SENNOSIDES-DOCUSATE SODIUM 8.6-50 MG PO TABS: 1.0000 | ORAL_TABLET | Freq: Every evening | ORAL | Status: DC | PRN

## 2019-03-09 MED ORDER — ZOLPIDEM TARTRATE 5 MG PO TABS: 5.0000 mg | ORAL_TABLET | Freq: Every evening | ORAL | Status: DC | PRN

## 2019-03-09 MED ORDER — SODIUM CHLORIDE 0.9 % IV SOLN: INTRAVENOUS | Status: DC

## 2019-03-09 MED ORDER — AMLODIPINE BESYLATE 5 MG PO TABS
5.0000 mg | ORAL_TABLET | Freq: Every day | ORAL | Status: DC
  Administered 2019-03-09 – 2019-03-15 (×7): 5 mg via ORAL
  Filled 2019-03-09 (×7): qty 1

## 2019-03-09 MED ORDER — MIRTAZAPINE 15 MG PO TABS
15.0000 mg | ORAL_TABLET | Freq: Every day | ORAL | Status: DC
  Administered 2019-03-09 – 2019-03-14 (×6): 15 mg via ORAL
  Filled 2019-03-09 (×6): qty 1

## 2019-03-09 MED ORDER — KETOROLAC TROMETHAMINE 30 MG/ML IJ SOLN
15.0000 mg | Freq: Four times a day (QID) | INTRAMUSCULAR | Status: DC | PRN
  Administered 2019-03-10: 15 mg via INTRAVENOUS
  Filled 2019-03-09: qty 1

## 2019-03-09 MED ORDER — ALBUTEROL SULFATE HFA 108 (90 BASE) MCG/ACT IN AERS: 1.0000 | INHALATION_SPRAY | Freq: Four times a day (QID) | RESPIRATORY_TRACT | Status: DC | PRN

## 2019-03-09 MED ORDER — ACETAMINOPHEN 325 MG PO TABS
650.0000 mg | ORAL_TABLET | Freq: Four times a day (QID) | ORAL | Status: DC | PRN
  Administered 2019-03-10 (×2): 650 mg via ORAL
  Administered 2019-03-11: 325 mg via ORAL
  Administered 2019-03-11 – 2019-03-12 (×3): 650 mg via ORAL
  Filled 2019-03-09 (×6): qty 2

## 2019-03-09 MED ORDER — ALBUTEROL SULFATE (2.5 MG/3ML) 0.083% IN NEBU
INHALATION_SOLUTION | RESPIRATORY_TRACT | Status: AC
  Filled 2019-03-09: qty 3

## 2019-03-09 MED ORDER — ONDANSETRON HCL 4 MG/2ML IJ SOLN: 4.0000 mg | Freq: Four times a day (QID) | INTRAMUSCULAR | Status: DC | PRN

## 2019-03-09 MED ORDER — MAGNESIUM CITRATE PO SOLN: 1.0000 | Freq: Once | ORAL | Status: DC | PRN

## 2019-03-09 MED ORDER — ALBUTEROL SULFATE (2.5 MG/3ML) 0.083% IN NEBU
2.5000 mg | INHALATION_SOLUTION | Freq: Four times a day (QID) | RESPIRATORY_TRACT | Status: DC | PRN
  Administered 2019-03-10 (×2): 2.5 mg via RESPIRATORY_TRACT
  Filled 2019-03-09 (×2): qty 3

## 2019-03-09 MED ORDER — SODIUM CHLORIDE 0.9 % IV SOLN: 250.0000 mL | INTRAVENOUS | Status: DC | PRN

## 2019-03-09 MED ORDER — SODIUM CHLORIDE 0.9 % IV SOLN
INTRAVENOUS | Status: DC
  Administered 2019-03-09 – 2019-03-10 (×2): via INTRAVENOUS

## 2019-03-09 MED ORDER — FENTANYL CITRATE (PF) 100 MCG/2ML IJ SOLN
INTRAMUSCULAR | Status: AC | PRN
  Administered 2019-03-09: 50 ug via INTRAVENOUS

## 2019-03-09 MED ORDER — KETOROLAC TROMETHAMINE 15 MG/ML IJ SOLN
INTRAMUSCULAR | Status: AC | PRN
  Administered 2019-03-09: 15 mg via INTRAVENOUS

## 2019-03-09 MED ORDER — KETOROLAC TROMETHAMINE 30 MG/ML IJ SOLN: 15.0000 mg | Freq: Four times a day (QID) | INTRAMUSCULAR | Status: DC | PRN

## 2019-03-09 MED ORDER — SODIUM CHLORIDE 0.9% FLUSH: 3.0000 mL | Freq: Two times a day (BID) | INTRAVENOUS | Status: DC

## 2019-03-09 MED ORDER — SODIUM CHLORIDE 0.9% FLUSH: 3.0000 mL | INTRAVENOUS | Status: DC | PRN

## 2019-03-09 MED ORDER — MIDAZOLAM HCL 2 MG/2ML IJ SOLN
INTRAMUSCULAR | Status: AC | PRN
  Administered 2019-03-09: 1 mg via INTRAVENOUS

## 2019-03-09 MED ORDER — IBUPROFEN 400 MG PO TABS: 400.0000 mg | ORAL_TABLET | Freq: Four times a day (QID) | ORAL | Status: DC | PRN

## 2019-03-09 MED ORDER — SODIUM CHLORIDE 0.9 % IV SOLN
INTRAVENOUS | Status: AC | PRN
  Administered 2019-03-09: 250 mL via INTRAVENOUS

## 2019-03-09 MED ORDER — ONDANSETRON HCL 4 MG PO TABS: 4.0000 mg | ORAL_TABLET | Freq: Four times a day (QID) | ORAL | Status: DC | PRN

## 2019-03-09 MED ORDER — ACETAMINOPHEN 325 MG PO TABS: 650.0000 mg | ORAL_TABLET | Freq: Four times a day (QID) | ORAL | Status: DC | PRN

## 2019-03-09 MED ORDER — LOSARTAN POTASSIUM 50 MG PO TABS
100.0000 mg | ORAL_TABLET | Freq: Every evening | ORAL | Status: DC
  Administered 2019-03-09 – 2019-03-15 (×7): 100 mg via ORAL
  Filled 2019-03-09 (×7): qty 2

## 2019-03-09 MED ORDER — LIDOCAINE HCL 1 % IJ SOLN
INTRAMUSCULAR | Status: AC
  Filled 2019-03-09: qty 20

## 2019-03-09 NOTE — Progress Notes (Addendum)
Patient ID: Sandra Cooper, female   DOB: 05-27-1930, 85 y.o.   MRN: [id number]   Mild post procedure hemoptysis resolved spontaneously. Patient still requires supplemental O2 to maintain sats >90%. Left pain resolved post toradol, prob pleural irritation. F/u CXR shows line over L apex which appears to extend laterally beyond the thoracic cage suggesting skin fold, although ptx was questioned by Dr. Carlis Abbott. As she is quite stable and comfortable, we will get a f/u in another 2 hours to assess, sooner if any decompensation. Plan overnight observation, wean O2, hopefully home in the AM. I called son x2 with updates. I notified Dr. Kipp Brood of her extended observation status.

## 2019-03-09 NOTE — Procedures (Signed)
  Procedure: CT core biopsy LLL nodule   EBL:   minimal Complications:  Post op hemoptysis   See full dictation in [facility].  Dillard Cannon MD Main # [phone] Pager  [phone]

## 2019-03-09 NOTE — Progress Notes (Signed)
Chief Complaint: Patient was seen in consultation today for lung nodule biopsy at the request of [place]  Referring Physician(s): Lightfoot,Harrell O  Supervising Physician: Arne Cleveland  Patient Status: [name] - Out-pt  History of Present Illness: Sandra Cooper is a 85 y.o. female being worked up for left lung nodule. After seeing Dr. Kipp Brood and having PET scan, she is referred for CT perc biopsy to obtain tissue sampling. PMHx, meds, labs, imaging, allergies reviewed. Feels well, no recent fevers, chills, illness. Has been NPO today as directed. Family on telephone.    Past Medical History:  Diagnosis Date  . Arthritis   . Asthma   . Hypertension     Past Surgical History:  Procedure Laterality Date  . APPENDECTOMY    . HERNIA REPAIR      Allergies: Fluoxetine  Medications: Prior to Admission medications   Medication Sig Start Date End Date Taking? Authorizing Provider  albuterol (VENTOLIN HFA) 108 (90 Base) MCG/ACT inhaler Inhale 1 puff into the lungs every 6 (six) hours as needed for wheezing or shortness of breath. 09/02/18  Yes [provider]  amLODipine (NORVASC) 5 MG tablet Take 5 mg by mouth daily. 12/14/18  Yes [provider]  Calcium Carb-Cholecalciferol (CALCIUM-VITAMIN D) 600-400 MG-UNIT TABS Take 1 tablet by mouth daily.   Yes [provider]  Camphor-Menthol-Methyl Sal (SALONPAS) 3.06-06-08 % PTCH Place 1 patch onto the skin daily as needed (pain).   Yes [provider]  cholecalciferol (VITAMIN D3) 25 MCG (1000 UT) tablet Take 1,000 Units by mouth daily.   Yes [provider]  DHA-Vitamin C-Lutein (EYE HEALTH FORMULA PO) Take 1 tablet by mouth daily.   Yes [provider]  ibuprofen (ADVIL) 200 MG tablet Take 200 mg by mouth daily as needed for headache.   Yes [provider]  losartan (COZAAR) 100 MG tablet Take 100 mg by mouth every evening. 01/21/19  Yes [provider]   magnesium gluconate (MAGONATE) 500 MG tablet Take 500 mg by mouth 2 (two) times daily.   Yes [provider]  mirtazapine (REMERON) 15 MG tablet Take 15 mg by mouth at bedtime. 11/29/18  Yes [provider]  Multiple Vitamin (MULTIVITAMIN) tablet Take 1 tablet by mouth daily.   Yes [provider]  Omega-3-6-9 CAPS Take 1-2 capsules by mouth See admin instructions. 2 caps in the morning, and 1 cap in the evening   Yes [provider]  Potassium 99 MG TABS Take 99 mg by mouth daily.   Yes [provider]  vitamin B-12 (CYANOCOBALAMIN) 100 MCG tablet Take 100 mcg by mouth daily.   Yes [provider]  aspirin EC 81 MG tablet Take 81 mg by mouth daily.    [provider]     No family history on file.  Social History   Socioeconomic History  . Marital status: Widowed    Spouse name: Not on file  . Number of children: Not on file  . Years of education: Not on file  . Highest education level: Not on file  Occupational History  . Not on file  Social Needs  . Financial resource strain: Not on file  . Food insecurity    Worry: Not on file    Inability: Not on file  . Transportation needs    Medical: Not on file    Non-medical: Not on file  Tobacco Use  . Smoking status: Current Every Day Smoker    Types: Cigarettes  .  Smokeless tobacco: Never Used  Substance and Sexual Activity  . Alcohol use: Never    Frequency: Never  . Drug use: Never  . Sexual activity: Not Currently  Lifestyle  . Physical activity    Days per week: Not on file    Minutes per session: Not on file  . Stress: Not on file  Relationships  . Social Herbalist on phone: Not on file    Gets together: Not on file    Attends religious service: Not on file    Active member of club or organization: Not on file    Attends meetings of clubs or organizations: Not on file    Relationship status: Not on file  Other Topics Concern  . Not on file   Social History Narrative  . Not on file     Review of Systems: A 12 point ROS discussed and pertinent positives are indicated in the HPI above.  All other systems are negative.  Review of Systems  Vital Signs: BP (!) 167/86   Pulse 72   Temp 98.3 F (36.8 C) (Oral)   Resp 14   Ht 5' (1.524 m)   Wt 49.9 kg   SpO2 94%   BMI 21.48 kg/m   Physical Exam Constitutional:      Appearance: Normal appearance.  HENT:     Mouth/Throat:     Mouth: Mucous membranes are moist.     Pharynx: Oropharynx is clear.  Cardiovascular:     Rate and Rhythm: Normal rate and regular rhythm.     Heart sounds: Normal heart sounds.  Pulmonary:     Effort: Pulmonary effort is normal. No respiratory distress.     Breath sounds: Normal breath sounds.  Skin:    General: Skin is warm and dry.  Neurological:     General: No focal deficit present.     Mental Status: She is alert and oriented to person, place, and time.  Psychiatric:        Mood and Affect: Mood normal.        Judgment: Judgment normal.       Imaging: Nm Pet Image Initial (pi) Skull Base To Thigh  Result Date: 02/15/2019 CLINICAL DATA:  Initial treatment strategy for left lower lobe nodule. EXAM: NUCLEAR MEDICINE PET SKULL BASE TO THIGH TECHNIQUE: 5.4 mCi F-18 FDG was injected intravenously. Full-ring PET imaging was performed from the skull base to thigh after the radiotracer. CT data was obtained and used for attenuation correction and anatomic localization. Fasting blood glucose: 80 mg/dl COMPARISON:  CTA chest 01/31/2019 FINDINGS: Mediastinal blood pool activity: SUV max 2.3 Liver activity: SUV max NA NECK: No areas of abnormal hypermetabolism. Incidental CT findings: No cervical adenopathy. Bilateral carotid atherosclerosis. CHEST: Left axillary hypermetabolism including at a S.U.V. max of 2.2, corresponding to tiny nodes on 49/4. There is also low-level hypermetabolism within both hila, without adenopathy. The irregular, partially  cavitary lateral left lower lobe pulmonary nodule measures 1.8 x 1.5 cm and a S.U.V. max of 2.3 on 45/8. Compare 2.1 x 1.7 cm on the prior exam. Morphologically appears similar. Incidental CT findings: Mild centrilobular emphysema. A 3 mm lingular nodule is unchanged on 45/8. Aortic and coronary artery atherosclerosis. ABDOMEN/PELVIS: No abdominopelvic parenchymal or nodal hypermetabolism. Incidental CT findings: Normal adrenal glands. Well-circumscribed low-density liver lesions are likely cysts. Abdominal aortic atherosclerosis. Large colonic stool burden. Pelvic floor laxity. SKELETON: No abnormal marrow activity. Incidental CT findings: Osteopenia. IMPRESSION: 1. Low-level, indeterminate hypermetabolism corresponding  to the left lower lobe pulmonary nodule. Although this appears morphologically similar, it measures slightly smaller today. Especially if the patient has infectious symptoms, consider antibiotic therapy and CT follow-up at 6-12 weeks. If no infectious symptoms, potential clinical strategies include tissue sampling versus CT follow-up at 3 months. 2. Thoracic nodal hypermetabolism, favored to be reactive. 3. Aortic atherosclerosis (ICD10-I70.0), coronary artery atherosclerosis and emphysema (ICD10-J43.9). Electronically Signed   By: Abigail Miyamoto M.D.   On: 02/15/2019 13:05   US Thyroid  Result Date: 02/15/2019 CLINICAL DATA:  85 year old female with a thyroid nodule EXAM: THYROID ULTRASOUND TECHNIQUE: Ultrasound examination of the thyroid gland and adjacent soft tissues was performed. COMPARISON:  None. FINDINGS: Parenchymal Echotexture: Normal Isthmus: 3.4 cm Right lobe: 3.6 cm x 1.4 cm x 1.1 cm Left lobe: 4.2 cm x 1.8 cm x 1.7 cm _________________________________________________________ Estimated total number of nodules >/= 1 cm: 1 Number of spongiform nodules >/=  2 cm not described below (TR1): 0 Number of mixed cystic and solid nodules >/= 1.5 cm not described below (TR2): 0  _________________________________________________________ Nodule # 1: Location: Left; Inferior Maximum size: 2.6 cm; Other 2 dimensions: 2.1 cm x 2.1 cm Composition: mixed cystic and solid (1) Echogenicity: isoechoic (1) Shape: taller-than-wide (3) Margins: smooth (0) Echogenic foci: none (0) ACR TI-RADS total points: 5. ACR TI-RADS risk category: TR4 (4-6 points). ACR TI-RADS recommendations: Nodule meets criteria for biopsy _________________________________________________________ No adenopathy IMPRESSION: Left inferior thyroid nodule (labeled 1, TR 4) meets criteria for biopsy, as designated by the newly established ACR TI-RADS criteria, and referral for biopsy is recommended. Recommendations follow those established by the new ACR TI-RADS criteria (J Am Coll Radiol 9628;36:629-476). Electronically Signed   By: Corrie Mckusick D.O.   On: 02/15/2019 09:31    Labs:  CBC: Recent Labs    01/31/19 1221 03/09/19 0929  WBC 5.7 5.6  HGB 14.1 15.2*  HCT 43.9 46.6*  PLT 300 336    COAGS: Recent Labs    03/09/19 0929  INR 0.9    BMP: Recent Labs    01/31/19 1221  NA 138  K 3.9  CL 103  CO2 25  GLUCOSE 98  BUN 19  CALCIUM 9.4  CREATININE 0.81  GFRNONAA >60  GFRAA >60    LIVER FUNCTION TESTS: Recent Labs    01/31/19 1221  BILITOT 0.9  AST 21  ALT 20  ALKPHOS 73  PROT 7.2  ALBUMIN 4.1    TUMOR MARKERS: No results for input(s): AFPTM, CEA, CA199, CHROMGRNA in the last 8760 hours.  Assessment and Plan: Left lung nodule For CT guided biopsy Labs ok Risks and benefits of CT guided lung nodule biopsy was discussed with the patient including, but not limited to bleeding, hemoptysis, respiratory failure requiring intubation, infection, pneumothorax requiring chest tube placement, stroke from air embolism or even death.  All of the patient's questions were answered and the patient is agreeable to proceed.  Consent signed and in chart.     Thank you for this interesting  consult.  I greatly enjoyed meeting Davis Vannatter and look forward to participating in their care.  A copy of this report was sent to the requesting provider on this date.  Electronically Signed: Ascencion Dike, PA-C 03/09/2019, 11:01 AM   I spent a total of 20 minutes in face to face in clinical consultation, greater than 50% of which was counseling/coordinating care for lung nodule biopsy

## 2019-03-09 NOTE — Sedation Documentation (Addendum)
Pt placed on simple mask at 6L

## 2019-03-09 NOTE — Sedation Documentation (Signed)
Pt awake and alert. States she feels much better. Changed her from non-rebreather to 4L [place]. With Sa02 92-93%. Pt in bed on left side texting.

## 2019-03-09 NOTE — Progress Notes (Signed)
Patient with significant hemoptysis following lung biopsy today. Hemopytsis was self-limiting and resolved quickly, however patient desatted requiring supplemental O2.  O2 decreased to 77% during procedure with initial improvement to 88% on non-rebreather. Anesthesia called to bedside for possible intubation, however patient remained stable, alert, oriented, without respiratory distress.  No intubation required.   CXR showed: No pneumothorax status post left lung biopsy. Slightly increased left basilar density is noted which may represent pulmonary hemorrhage secondary to biopsy.  Patient currently remains stable on non-rebreather with improved oxygenation at 97%.  Vital signs otherwise stable.  BP 106/67, HR 77, resting comfortably.  However, now with difficulty weaning from NRB. When placed on 4L [place], patient desats to 88%.   Discussed with Dr. Vernard Gambles.  Repeat CXR now.  Admit for observation overnight.    Brynda Greathouse, MS RD PA-C 2:35 PM

## 2019-03-09 NOTE — Sedation Documentation (Signed)
Pt sp02 to 6 02 increased to 8L

## 2019-03-09 NOTE — Significant Event (Addendum)
Rapid Response Event Note  Overview: Time Called: 1219 Arrival Time: 1223 Event Type: Respiratory  Initial Focused Assessment: Patient sp Lung biopsy. Per staff: she had hemoptysis, about 100cc blood in suction canister.  De sat into the 70s.  Upon my arrival she is no longer coughing up blood.  PCXR has been done.  No pneumo per radiologist.  Patient on NRB O2 sats 86-88% BP 138/83  HR 94  RR 32  Interventions: Assisted to lying on her left side. Lung sounds tight, hx asthma: albuterol tx given. Continued 100% NRB,  O2 sats improved to 94%. BP 105/69  HR 85  RR 20 Transferred to radiology nursing station. Patient continues to complain of left chest pain.  Toradol given IV No additional hemoptysis,  Pt comfortable lying on her left side.  1730  Weaning O2, pt on [place] 4L   Plan of Care (if not transferred): RN to call if O2 needs increase or if additional hemoptysis  Event Summary: Name of Physician Notified: Dr Vernard Gambles at bedside at      at    Outcome: Other (Comment)(plan admit)  Event End Time: [place]  Raliegh Ip

## 2019-03-09 NOTE — Sedation Documentation (Signed)
Pt with Sa02 88-89% on 4L [place]. Placed back on Non rebreather with Sa02 94-95%. Pt in no distress. No hemoptysis.

## 2019-03-09 NOTE — Sedation Documentation (Signed)
Pt coughing up blood, suctioned, placed on 15L NRB turned to Left side.  MD at bedside pt awake coughing

## 2019-03-09 NOTE — Sedation Documentation (Signed)
Pt changed to 4L [place]. Awake and alert. In no distress. Sa02 93%

## 2019-03-09 NOTE — Discharge Instructions (Addendum)
Needle Biopsy, Care After °These instructions tell you how to care for yourself after your procedure. Your doctor may also give you more specific instructions. Call your doctor if you have any problems or questions. °What can I expect after the procedure? °After the procedure, it is common to have: °· Soreness. °· Bruising. °· Mild pain. °Follow these instructions at home: ° °· Return to your normal activities as told by your doctor. Ask your doctor what activities are safe for you. °· Take over-the-counter and prescription medicines only as told by your doctor. °· Wash your hands with soap and water before you change your bandage (dressing). If you cannot use soap and water, use hand sanitizer. °· Follow instructions from your doctor about: °? How to take care of your puncture site. °? When and how to change your bandage. °? When to remove your bandage. °· Check your puncture site every day for signs of infection. Watch for: °? Redness, swelling, or pain. °? Fluid or blood.  °? Pus or a bad smell. °? Warmth. °· Do not take baths, swim, or use a hot tub until your doctor approves. Ask your doctor if you may take showers. You may only be allowed to take sponge baths. °· Keep all follow-up visits as told by your doctor. This is important. °Contact a doctor if you have: °· A fever. °· Redness, swelling, or pain at the puncture site, and it lasts longer than a few days. °· Fluid, blood, or pus coming from the puncture site. °· Warmth coming from the puncture site. °Get help right away if: °· You have a lot of bleeding from the puncture site. °Summary °· After the procedure, it is common to have soreness, bruising, or mild pain at the puncture site. °· Check your puncture site every day for signs of infection, such as redness, swelling, or pain. °· Get help right away if you have severe bleeding from your puncture site. °This information is not intended to replace advice given to you by your health care provider. Make  sure you discuss any questions you have with your health care provider. °Document Released: 04/30/2008 Document Revised: 05/31/2017 Document Reviewed: 05/31/2017 °Elsevier Patient Education © 2020 [employer] °Moderate Conscious Sedation, Adult, Care After °These instructions provide you with information about caring for yourself after your procedure. Your health care provider may also give you more specific instructions. Your treatment has been planned according to current medical practices, but problems sometimes occur. Call your health care provider if you have any problems or questions after your procedure. °What can I expect after the procedure? °After your procedure, it is common: °· To feel sleepy for several hours. °· To feel clumsy and have poor balance for several hours. °· To have poor judgment for several hours. °· To vomit if you eat too soon. °Follow these instructions at home: °For at least 24 hours after the procedure: ° °· Do not: °? Participate in activities where you could fall or become injured. °? Drive. °? Use heavy machinery. °? Drink alcohol. °? Take sleeping pills or medicines that cause drowsiness. °? Make important decisions or sign legal documents. °? Take care of children on your own. °· Rest. °Eating and drinking °· Follow the diet recommended by your health care provider. °· If you vomit: °? Drink water, juice, or soup when you can drink without vomiting. °? Make sure you have little or no nausea before eating solid foods. °General instructions °· Have a responsible adult stay with   you until you are awake and alert. °· Take over-the-counter and prescription medicines only as told by your health care provider. °· If you smoke, do not smoke without supervision. °· Keep all follow-up visits as told by your health care provider. This is important. °Contact a health care provider if: °· You keep feeling nauseous or you keep vomiting. °· You feel light-headed. °· You develop a rash. °· You  have a fever. °Get help right away if: °· You have trouble breathing. °This information is not intended to replace advice given to you by your health care provider. Make sure you discuss any questions you have with your health care provider. °Document Released: 03/08/2013 Document Revised: 04/30/2017 Document Reviewed: 09/07/2015 °Elsevier Patient Education © 2020 [employer] ° °

## 2019-03-10 ENCOUNTER — Observation Stay (HOSPITAL_COMMUNITY): Payer: Medicare HMO

## 2019-03-10 DIAGNOSIS — R0600 Dyspnea, unspecified: Secondary | ICD-10-CM | POA: Diagnosis not present

## 2019-03-10 DIAGNOSIS — D62 Acute posthemorrhagic anemia: Secondary | ICD-10-CM | POA: Diagnosis present

## 2019-03-10 DIAGNOSIS — J45901 Unspecified asthma with (acute) exacerbation: Secondary | ICD-10-CM | POA: Diagnosis not present

## 2019-03-10 DIAGNOSIS — F17210 Nicotine dependence, cigarettes, uncomplicated: Secondary | ICD-10-CM | POA: Diagnosis present

## 2019-03-10 DIAGNOSIS — R042 Hemoptysis: Secondary | ICD-10-CM

## 2019-03-10 DIAGNOSIS — J45909 Unspecified asthma, uncomplicated: Secondary | ICD-10-CM | POA: Diagnosis present

## 2019-03-10 DIAGNOSIS — Z7189 Other specified counseling: Secondary | ICD-10-CM | POA: Diagnosis not present

## 2019-03-10 DIAGNOSIS — R0902 Hypoxemia: Secondary | ICD-10-CM | POA: Diagnosis not present

## 2019-03-10 DIAGNOSIS — C3432 Malignant neoplasm of lower lobe, left bronchus or lung: Secondary | ICD-10-CM | POA: Diagnosis present

## 2019-03-10 DIAGNOSIS — R0602 Shortness of breath: Secondary | ICD-10-CM | POA: Diagnosis not present

## 2019-03-10 DIAGNOSIS — Z79899 Other long term (current) drug therapy: Secondary | ICD-10-CM | POA: Diagnosis not present

## 2019-03-10 DIAGNOSIS — C3492 Malignant neoplasm of unspecified part of left bronchus or lung: Secondary | ICD-10-CM | POA: Diagnosis not present

## 2019-03-10 DIAGNOSIS — Z20828 Contact with and (suspected) exposure to other viral communicable diseases: Secondary | ICD-10-CM | POA: Diagnosis present

## 2019-03-10 DIAGNOSIS — O85 Puerperal sepsis: Secondary | ICD-10-CM | POA: Diagnosis not present

## 2019-03-10 DIAGNOSIS — J9601 Acute respiratory failure with hypoxia: Secondary | ICD-10-CM | POA: Diagnosis present

## 2019-03-10 DIAGNOSIS — I10 Essential (primary) hypertension: Secondary | ICD-10-CM | POA: Diagnosis present

## 2019-03-10 DIAGNOSIS — Z7982 Long term (current) use of aspirin: Secondary | ICD-10-CM | POA: Diagnosis not present

## 2019-03-10 DIAGNOSIS — R05 Cough: Secondary | ICD-10-CM | POA: Diagnosis not present

## 2019-03-10 LAB — SURGICAL PATHOLOGY

## 2019-03-10 LAB — HEMOGLOBIN AND HEMATOCRIT, BLOOD
HCT: 35.1 % — ABNORMAL LOW (ref 36.0–46.0)
Hemoglobin: 11.5 g/dL — ABNORMAL LOW (ref 12.0–15.0)

## 2019-03-10 MED ORDER — IPRATROPIUM-ALBUTEROL 0.5-2.5 (3) MG/3ML IN SOLN
3.0000 mL | Freq: Four times a day (QID) | RESPIRATORY_TRACT | Status: DC
  Administered 2019-03-10: 3 mL via RESPIRATORY_TRACT
  Filled 2019-03-10: qty 3

## 2019-03-10 MED ORDER — IPRATROPIUM-ALBUTEROL 0.5-2.5 (3) MG/3ML IN SOLN: 3.0000 mL | Freq: Four times a day (QID) | RESPIRATORY_TRACT | Status: DC | PRN

## 2019-03-10 NOTE — Progress Notes (Signed)
Attempted to call son, [name] x2 this afternoon.  No answer.  Also attempted to call daughter with no answer.   Brynda Greathouse, MS RD PA-C

## 2019-03-10 NOTE — Progress Notes (Signed)
Pt and family anxious about elevation of care,requesting MD to call daughter and update.MD text paged

## 2019-03-10 NOTE — Progress Notes (Signed)
Referring Physician(s): Hassell,Daniel  Supervising Physician: Arne Cleveland  Patient Status:  [name] [place] - In-pt  Chief Complaint: Hemoptysis   Subjective:  Patient has been assessed multiple times throughout the day by this PA. She is overall improved from her condition yesterday s/p biopsy however she has continued to require supplemental oxygen.  She is alert, oriented, with minimal pain.  She has been able to wean from NRB yesterday to 3L [place] this AM.  She does endorse shortness of breath.  Rapid response RN at bedside for follow-up visit.  She is given albuterol and toradol this morning along with her regular BP medications.   She does appear to become short of breath with extended conversation.  She reports she also just ate breakfast and is sometimes short of breath after she eats.   She does have a component of shortness of breath at home as this was the original indication for her work-up which revealed lung mass.  Discussed with son over the phone.  Both he and patient agree that her shortness of breath has not yet improved to her baseline.   Allergies: Fluoxetine  Medications: Prior to Admission medications   Medication Sig Start Date End Date Taking? Authorizing Provider  albuterol (VENTOLIN HFA) 108 (90 Base) MCG/ACT inhaler Inhale 2 puffs into the lungs every 6 (six) hours as needed for wheezing or shortness of breath.  09/02/18  Yes [provider]  amLODipine (NORVASC) 10 MG tablet Take 10 mg by mouth daily. 02/24/19  Yes [provider]  aspirin EC 81 MG tablet Take 81 mg by mouth at bedtime.    Yes [provider]  Calcium Carb-Cholecalciferol (CALCIUM-VITAMIN D) 600-400 MG-UNIT TABS Take 1 tablet by mouth daily.   Yes [provider]  Camphor-Menthol-Methyl Sal (SALONPAS) 3.06-06-08 % PTCH Place 1 patch onto the skin daily as needed (pain).   Yes [provider]  Cholecalciferol (VITAMIN D) 50 MCG (2000 UT) CAPS Take 2,000 Units  by mouth daily.   Yes [provider]  DHA-Vitamin C-Lutein (EYE HEALTH FORMULA PO) Take 1 tablet by mouth daily.   Yes [provider]  ibuprofen (ADVIL) 200 MG tablet Take 200-600 mg by mouth daily as needed for headache or mild pain.    Yes [provider]  losartan (COZAAR) 100 MG tablet Take 100 mg by mouth every evening. 01/21/19  Yes [provider]  magnesium gluconate (MAGONATE) 500 MG tablet Take 1,000 mg by mouth daily.    Yes [provider]  mirtazapine (REMERON) 15 MG tablet Take 15 mg by mouth at bedtime. 11/29/18  Yes [provider]  Multiple Vitamin (MULTIVITAMIN) tablet Take 1 tablet by mouth daily.   Yes [provider]  Omega-3-6-9 CAPS Take 1 capsule by mouth 2 (two) times daily.    Yes [provider]  OVER THE COUNTER MEDICATION Take 1 tablet by mouth daily. Life Extension, Brain Support Supplement.   Yes [provider]  Potassium 99 MG TABS Take 99 mg by mouth daily.   Yes [provider]  vitamin B-12 (CYANOCOBALAMIN) 500 MCG tablet Take 500 mcg by mouth daily.   Yes [provider]     Vital Signs: BP 110/68 (BP Location: Right Arm)    Pulse 79    Temp 98.6 F (37 C) (Oral)    Resp 20    SpO2 95%   Physical Exam  NAD, alert,  Heart:  RRR Chest: clear to ausculation bilaterally, no wheezes  Imaging: Dg Chest 2 View  Result Date: 03/10/2019 CLINICAL DATA:  Hemoptysis EXAM: CHEST - 2 VIEW COMPARISON:  None. FINDINGS: The heart size is normal. Vascular calcifications are seen in the aortic arch. An airspace opacity in the left lung base posteriorly likely represents the patient's previously biopsied pulmonary nodule with surrounding post biopsy hemorrhage. The right lung is clear. There is a tiny left pleural effusion. There is no pneumothorax. The visualized skeletal structures are unremarkable. IMPRESSION: 1. Tiny left pleural effusion. No pneumothorax. 2. Left basilar  airspace opacity, likely the patient's previously biopsied pulmonary nodule with surrounding post biopsy hemorrhage. Aortic Atherosclerosis (ICD10-I70.0). Electronically Signed   By: Zerita Boers M.D.   On: 03/10/2019 12:58   Dg Chest 2 View  Result Date: 03/10/2019 CLINICAL DATA:  Shortness of breath. Hemoptysis. Recent left lung biopsy. EXAM: CHEST - 2 VIEW COMPARISON:  Multiple chest x-rays dated 03/09/2019 and CT scan dated 01/31/2019 FINDINGS: The heart size and pulmonary vascularity are normal. The haziness at the left lung base has improved consistent with resolving post biopsy hemorrhage. Minimal blunting of the left costophrenic angle consistent with a tiny effusion. Right lung is clear. Both lungs are hyperinflated. Aortic atherosclerosis. No acute bone abnormality. Old compression fracture in the upper thoracic spine. IMPRESSION: 1. No acute abnormalities.  No pneumothorax. 2. Tiny left pleural effusion. 3. Improved haziness at the left lung base consistent with resolving post biopsy hemorrhage. 4. Aortic atherosclerosis. Electronically Signed   By: Lorriane Shire M.D.   On: 03/10/2019 10:47   Ct Biopsy  Result Date: 03/09/2019 CLINICAL DATA:  Hypermetabolic left lower lobe lung nodule EXAM: CT GUIDED CORE BIOPSY OF LEFT LOWER LOBE LUNG NODULE ANESTHESIA/SEDATION: Intravenous Fentanyl 21mcg and Versed 1mg  were administered as conscious sedation during continuous monitoring of the patient's level of consciousness and physiological / cardiorespiratory status by the radiology RN, with a total moderate sedation time of 14 minutes. PROCEDURE: The procedure risks, benefits, and alternatives were explained to the patient. Questions regarding the procedure were encouraged and answered. The patient understands and consents to the procedure. Patient placed supine. Select axial scans through the thorax were obtained. The left lower lobe lesion was localized. It was unchanged in size from previous PET-CT. An  appropriate skin entry site was determined and marked. The operative field was prepped with chlorhexidinein a sterile fashion, and a sterile drape was applied covering the operative field. A sterile gown and sterile gloves were used for the procedure. Local anesthesia was provided with 1% Lidocaine. Under CT fluoroscopic guidance, a 17 gauge trocar needle was advanced to the margin of the lesion. Once needle tip position was confirmed, coaxial 18-gauge core biopsy samples were obtained, submitted in formalin to surgical pathology. The guide needle was removed. Postprocedure scans show no pneumothorax. Small amount of peripheral alveolar hemorrhage. COMPLICATIONS: Self-limited hemoptysis and mild hypoxia which responded to supplemental O2. SIR level B: Nominal therapy (including overnight admission for observation), no consequence. FINDINGS: Left lower lobe nodule was similar in appearance to prior PET-CT. Representative core biopsy samples obtained as above. Patient had mild self limited hemoptysis postprocedure with mild hypoxia which responded to supplemental O2. She remained otherwise stable and will be kept for overnight observation , and follow-up chest radiography. IMPRESSION: 1. Technically successful CT-guided core biopsy, left lower lobe lung nodule. 2. Extended observation secondary to mild self-limited hemoptysis and hypoxia which responded to supplemental O2. Results were called by telephone at the time of interpretation on 03/09/2019 at 3:02 pm to providerHARRELL  LIGHTFOOT , who verbally acknowledged these results. Electronically Signed   By: Lucrezia Europe M.D.   On: 03/09/2019 15:04   Dg Chest Port 1 View  Result Date: 03/09/2019 CLINICAL DATA:  Hemoptysis after lung biopsy. EXAM: PORTABLE CHEST 1 VIEW COMPARISON:  Chest x-rays and CT biopsy from same day. FINDINGS: The heart size and mediastinal contours are within normal limits. Atherosclerotic calcification of the aortic arch. Normal pulmonary  vascularity. Unchanged patchy airspace disease at the left lung base. No pneumothorax or pleural effusion. No acute osseous abnormality. IMPRESSION: 1. No pneumothorax. The suspected pleural line on the prior x-ray represented a skin fold as it extended beyond the margins of the lung. 2. Unchanged post biopsy hemorrhage in the left lower lobe. Electronically Signed   By: Titus Dubin M.D.   On: 03/09/2019 20:06   Dg Chest Port 1 View  Result Date: 03/09/2019 CLINICAL DATA:  Hemoptysis post left lung needle biopsy EXAM: PORTABLE CHEST 1 VIEW COMPARISON:  03/09/2019 FINDINGS: Progression of left lower lobe airspace disease most likely post biopsy hemorrhage. Interval development of left pneumothorax approximately 2.5 mm. Minimal left effusion Right lung clear.  Atherosclerotic aortic arch. IMPRESSION: Progression of left lower lobe airspace disease compatible with hemorrhage post biopsy. Interval development of moderate left pneumothorax. These results will be called to the ordering clinician or representative by the Radiologist Assistant, and communication documented in the PACS or zVision Dashboard. Electronically Signed   By: Franchot Gallo M.D.   On: 03/09/2019 15:25   Dg Chest Port 1 View  Result Date: 03/09/2019 CLINICAL DATA:  Hemoptysis.  Lung biopsy. EXAM: PORTABLE CHEST 1 VIEW COMPARISON:  January 31, 2019. FINDINGS: Stable cardiomediastinal silhouette. Atherosclerosis of thoracic aorta is noted. Stable right basilar scarring is noted. Slightly increased left basilar opacity is noted which may represent pulmonary hemorrhage secondary to biopsy. Bony thorax is unremarkable. No pneumothorax or significant effusion is noted. IMPRESSION: No pneumothorax status post left lung biopsy. Slightly increased left basilar density is noted which may represent pulmonary hemorrhage secondary to biopsy. Aortic Atherosclerosis (ICD10-I70.0). Electronically Signed   By: Marijo Conception M.D.   On: 03/09/2019 12:54      Labs:  CBC: Recent Labs    01/31/19 1221 03/09/19 0929 03/10/19 1059  WBC 5.7 5.6  --   HGB 14.1 15.2* 11.5*  HCT 43.9 46.6* 35.1*  PLT 300 336  --     COAGS: Recent Labs    03/09/19 0929  INR 0.9    BMP: Recent Labs    01/31/19 1221  NA 138  K 3.9  CL 103  CO2 25  GLUCOSE 98  BUN 19  CALCIUM 9.4  CREATININE 0.81  GFRNONAA >60  GFRAA >60    LIVER FUNCTION TESTS: Recent Labs    01/31/19 1221  BILITOT 0.9  AST 21  ALT 20  ALKPHOS 73  PROT 7.2  ALBUMIN 4.1    Assessment and Plan: Hemoptysis after lung biopsy 10/8 Patient with ongoing oxygen requirement after episode of hemoptysis yesterday during lung biopsy.  Patient has successfully weaned to 3L [place], however with difficulty weaning further today despite adequate pain management and administration of her home albuterol.  Her hemoglobin decreased from 15 to 11.4 yesterday- note there may be a dilutional component after NPO status, IVFs yesterday. CXRs have been obtained x2 today which show stable appearance of her procedure site yesterday.  No additional lung findings to explain her need for O2.  Patient with some shortness of breath  at home, however does not feel she is back to baseline.   Discussed with Dr. Vernard Gambles who requests consultation from hospitalist service to assist with further weaning from O2 as there may be a baseline/functional component given reassuring imaging.   Electronically Signed: Docia Barrier, PA 03/10/2019, 2:06 PM   I spent a total of 15 Minutes at the the patient's bedside AND on the patient's hospital floor or unit, greater than 50% of which was counseling/coordinating care for hemoptysis.

## 2019-03-10 NOTE — Progress Notes (Signed)
Report called to [facility].

## 2019-03-10 NOTE — Significant Event (Addendum)
Rapid Response Event Note  Overview: Follow Up - Respiratory  Initial Focused Assessment: I came in to see Ms. Ashraf as a follow-up.  When I arrived, Ms. Moraes was quite short of breath, she endorsed it starting after she ate breakfast. Per the patient, her shortness of breath is worse now compared to before at home. I quickly repositioned her in the bed, sat her up more, shortness of breath improved some, Lung sounds - clear in all fields, good air movement throughout, 95% on 3L [place]. RR normal and even now, had some mild tachypnea when I first walked in but now improved. Skin warm and dry, HR 70s. Patient endorses 7/10 pain - LT side of chest - from biopsy. IR PA came in as well while I was there.   Interventions: -- Albuterol Neb x 1 -- Toradol 15 mg IV x 1  Plan of Care: -- Monitor VS -- Encourage IS/CDB -- OOB as ordered and as patient tolerates.  Event Summary:  Start Time 0850 End Time 0925   Gilberte Gorley R

## 2019-03-10 NOTE — H&P (Addendum)
[facility]                                                                                    Patient Demographics  Sandra Cooper, is a 85 y.o. female  CSN: [id number]  MRN: [id number]  DOB - April 25, 1930  Admit Date - 03/09/2019  Outpatient Primary MD for the patient is [name], MD   With History of -  Past Medical History:  Diagnosis Date  . Arthritis   . Asthma   . Hypertension       Past Surgical History:  Procedure Laterality Date  . APPENDECTOMY    . HERNIA REPAIR      in for   No chief complaint on file.    HPI  Sandra Cooper  is a 85 y.o. female, with past medical history significant for asthma, hypertension and lung nodule status post lung biopsy yesterday by IR after which she developed hemoptysis and was placed on observation.  Patient had a rapid response today due to her hypoxemia and received Toradol and nebulizer treatment.  Patient continued to have shortness of breath but no hemoptysis. Patient was born in [place], [place] and there history of asthma is not clear. Denies nausea, vomiting or abdominal pain.  Denies fever or chills . Patient is a current smoker and continues to smoke    Review of Systems    In addition to the HPI above,  No Fever-chills, No Headache, No changes with Vision or hearing, No problems swallowing food or Liquids, No Chest pain,  No Abdominal pain, No Nausea or Vommitting, Bowel movements are regular, No Blood in stool or Urine, No dysuria, No new skin rashes or bruises, No new joints pains-aches,  No new weakness, tingling, numbness in any extremity, No recent weight gain or loss, No polyuria, polydypsia or polyphagia, No significant Mental Stressors.  A full 10 point Review of Systems was done, except as stated above, all other Review of Systems were negative.   Social History Social History   Tobacco Use  . Smoking status: Current Every Day Smoker    Types: Cigarettes  . Smokeless tobacco: Never  Used  Substance Use Topics  . Alcohol use: Never    Frequency: Never     Family History Not really significant except for prostate cancer in her father  Prior to Admission medications   Medication Sig Start Date End Date Taking? Authorizing Provider  albuterol (VENTOLIN HFA) 108 (90 Base) MCG/ACT inhaler Inhale 2 puffs into the lungs every 6 (six) hours as needed for wheezing or shortness of breath.  09/02/18  Yes [provider]  amLODipine (NORVASC) 10 MG tablet Take 10 mg by mouth daily. 02/24/19  Yes [provider]  aspirin EC 81 MG tablet Take 81 mg by mouth at bedtime.    Yes [provider]  Calcium Carb-Cholecalciferol (CALCIUM-VITAMIN D) 600-400 MG-UNIT TABS Take 1 tablet by mouth daily.   Yes [provider]  Camphor-Menthol-Methyl Sal (SALONPAS) 3.06-06-08 % PTCH Place 1 patch onto the skin daily as needed (pain).   Yes [provider]  Cholecalciferol (VITAMIN D) 50 MCG (2000 UT) CAPS Take 2,000 Units  by mouth daily.   Yes [provider]  DHA-Vitamin C-Lutein (EYE HEALTH FORMULA PO) Take 1 tablet by mouth daily.   Yes [provider]  ibuprofen (ADVIL) 200 MG tablet Take 200-600 mg by mouth daily as needed for headache or mild pain.    Yes [provider]  losartan (COZAAR) 100 MG tablet Take 100 mg by mouth every evening. 01/21/19  Yes [provider]  magnesium gluconate (MAGONATE) 500 MG tablet Take 1,000 mg by mouth daily.    Yes [provider]  mirtazapine (REMERON) 15 MG tablet Take 15 mg by mouth at bedtime. 11/29/18  Yes [provider]  Multiple Vitamin (MULTIVITAMIN) tablet Take 1 tablet by mouth daily.   Yes [provider]  Omega-3-6-9 CAPS Take 1 capsule by mouth 2 (two) times daily.    Yes [provider]  OVER THE COUNTER MEDICATION Take 1 tablet by mouth daily. Life Extension, Brain Support Supplement.   Yes [provider]  Potassium 99 MG  TABS Take 99 mg by mouth daily.   Yes [provider]  vitamin B-12 (CYANOCOBALAMIN) 500 MCG tablet Take 500 mcg by mouth daily.   Yes [provider]    Allergies  Allergen Reactions  . Fluoxetine Swelling    Physical Exam  Vitals  Blood pressure 110/68, pulse 79, temperature 98.6 F (37 C), temperature source Oral, resp. rate 20, SpO2 95 %.  General appearance: Extremely pleasant elderly female, well-developed, in no acute distress HEENT no jaundice or pallor, no facial deviation, no oral thrush Neck supple, no neck vein distention Chest good inspiratory effort mild scattered rhonchi, no wheezing Heart normal S1-S2, no murmurs gallops or rubs Abdomen soft, nontender, bowel sounds are present Extremities no clubbing cyanosis or edema Neuro grossly nonfocal, patient moving all extremities   Data Review  CBC Recent Labs  Lab 03/09/19 0929 03/10/19 1059  WBC 5.6  --   HGB 15.2* 11.5*  HCT 46.6* 35.1*  PLT 336  --   MCV 89.3  --   MCH 29.1  --   MCHC 32.6  --   RDW 13.6  --    ------------------------------------------------------------------------------------------------------------------  Chemistries  No results for input(s): NA, K, CL, CO2, GLUCOSE, BUN, CREATININE, CALCIUM, MG, AST, ALT, ALKPHOS, BILITOT in the last 168 hours.  Invalid input(s): GFRCGP ------------------------------------------------------------------------------------------------------------------ CrCl cannot be calculated (Patient's most recent lab result is older than the maximum 21 days allowed.). ------------------------------------------------------------------------------------------------------------------ No results for input(s): TSH, T4TOTAL, T3FREE, THYROIDAB in the last 72 hours.  Invalid input(s): FREET3   Coagulation profile Recent Labs  Lab 03/09/19 0929  INR 0.9    ------------------------------------------------------------------------------------------------------------------- No results for input(s): DDIMER in the last 72 hours. -------------------------------------------------------------------------------------------------------------------  Cardiac Enzymes No results for input(s): CKMB, TROPONINI, MYOGLOBIN in the last 168 hours.  Invalid input(s): CK ------------------------------------------------------------------------------------------------------------------ Invalid input(s): POCBNP   ---------------------------------------------------------------------------------------------------------------  Urinalysis    Component Value Date/Time   COLORURINE YELLOW 01/31/2019 1230   APPEARANCEUR CLEAR 01/31/2019 1230   LABSPEC 1.016 01/31/2019 1230   PHURINE 7.0 01/31/2019 1230   GLUCOSEU NEGATIVE 01/31/2019 1230   HGBUR NEGATIVE 01/31/2019 1230   BILIRUBINUR NEGATIVE 01/31/2019 1230   KETONESUR NEGATIVE 01/31/2019 1230   PROTEINUR NEGATIVE 01/31/2019 1230   NITRITE NEGATIVE 01/31/2019 1230   LEUKOCYTESUR NEGATIVE 01/31/2019 1230    ----------------------------------------------------------------------------------------------------------------   Imaging results:   Dg Chest 2 View  Result Date: 03/10/2019 CLINICAL DATA:  Hemoptysis EXAM: CHEST - 2 VIEW COMPARISON:  None. FINDINGS: The heart size is  normal. Vascular calcifications are seen in the aortic arch. An airspace opacity in the left lung base posteriorly likely represents the patient's previously biopsied pulmonary nodule with surrounding post biopsy hemorrhage. The right lung is clear. There is a tiny left pleural effusion. There is no pneumothorax. The visualized skeletal structures are unremarkable. IMPRESSION: 1. Tiny left pleural effusion. No pneumothorax. 2. Left basilar airspace opacity, likely the patient's previously biopsied pulmonary nodule with surrounding post biopsy  hemorrhage. Aortic Atherosclerosis (ICD10-I70.0). Electronically Signed   By: Zerita Boers M.D.   On: 03/10/2019 12:58   Dg Chest 2 View  Result Date: 03/10/2019 CLINICAL DATA:  Shortness of breath. Hemoptysis. Recent left lung biopsy. EXAM: CHEST - 2 VIEW COMPARISON:  Multiple chest x-rays dated 03/09/2019 and CT scan dated 01/31/2019 FINDINGS: The heart size and pulmonary vascularity are normal. The haziness at the left lung base has improved consistent with resolving post biopsy hemorrhage. Minimal blunting of the left costophrenic angle consistent with a tiny effusion. Right lung is clear. Both lungs are hyperinflated. Aortic atherosclerosis. No acute bone abnormality. Old compression fracture in the upper thoracic spine. IMPRESSION: 1. No acute abnormalities.  No pneumothorax. 2. Tiny left pleural effusion. 3. Improved haziness at the left lung base consistent with resolving post biopsy hemorrhage. 4. Aortic atherosclerosis. Electronically Signed   By: Lorriane Shire M.D.   On: 03/10/2019 10:47   Nm Pet Image Initial (pi) Skull Base To Thigh  Result Date: 02/15/2019 CLINICAL DATA:  Initial treatment strategy for left lower lobe nodule. EXAM: NUCLEAR MEDICINE PET SKULL BASE TO THIGH TECHNIQUE: 5.4 mCi F-18 FDG was injected intravenously. Full-ring PET imaging was performed from the skull base to thigh after the radiotracer. CT data was obtained and used for attenuation correction and anatomic localization. Fasting blood glucose: 80 mg/dl COMPARISON:  CTA chest 01/31/2019 FINDINGS: Mediastinal blood pool activity: SUV max 2.3 Liver activity: SUV max NA NECK: No areas of abnormal hypermetabolism. Incidental CT findings: No cervical adenopathy. Bilateral carotid atherosclerosis. CHEST: Left axillary hypermetabolism including at a S.U.V. max of 2.2, corresponding to tiny nodes on 49/4. There is also low-level hypermetabolism within both hila, without adenopathy. The irregular, partially cavitary lateral  left lower lobe pulmonary nodule measures 1.8 x 1.5 cm and a S.U.V. max of 2.3 on 45/8. Compare 2.1 x 1.7 cm on the prior exam. Morphologically appears similar. Incidental CT findings: Mild centrilobular emphysema. A 3 mm lingular nodule is unchanged on 45/8. Aortic and coronary artery atherosclerosis. ABDOMEN/PELVIS: No abdominopelvic parenchymal or nodal hypermetabolism. Incidental CT findings: Normal adrenal glands. Well-circumscribed low-density liver lesions are likely cysts. Abdominal aortic atherosclerosis. Large colonic stool burden. Pelvic floor laxity. SKELETON: No abnormal marrow activity. Incidental CT findings: Osteopenia. IMPRESSION: 1. Low-level, indeterminate hypermetabolism corresponding to the left lower lobe pulmonary nodule. Although this appears morphologically similar, it measures slightly smaller today. Especially if the patient has infectious symptoms, consider antibiotic therapy and CT follow-up at 6-12 weeks. If no infectious symptoms, potential clinical strategies include tissue sampling versus CT follow-up at 3 months. 2. Thoracic nodal hypermetabolism, favored to be reactive. 3. Aortic atherosclerosis (ICD10-I70.0), coronary artery atherosclerosis and emphysema (ICD10-J43.9). Electronically Signed   By: Abigail Miyamoto M.D.   On: 02/15/2019 13:05   Ct Biopsy  Result Date: 03/09/2019 CLINICAL DATA:  Hypermetabolic left lower lobe lung nodule EXAM: CT GUIDED CORE BIOPSY OF LEFT LOWER LOBE LUNG NODULE ANESTHESIA/SEDATION: Intravenous Fentanyl 66mcg and Versed 1mg  were administered as conscious sedation during continuous monitoring of the patient's level of consciousness  and physiological / cardiorespiratory status by the radiology RN, with a total moderate sedation time of 14 minutes. PROCEDURE: The procedure risks, benefits, and alternatives were explained to the patient. Questions regarding the procedure were encouraged and answered. The patient understands and consents to the procedure.  Patient placed supine. Select axial scans through the thorax were obtained. The left lower lobe lesion was localized. It was unchanged in size from previous PET-CT. An appropriate skin entry site was determined and marked. The operative field was prepped with chlorhexidinein a sterile fashion, and a sterile drape was applied covering the operative field. A sterile gown and sterile gloves were used for the procedure. Local anesthesia was provided with 1% Lidocaine. Under CT fluoroscopic guidance, a 17 gauge trocar needle was advanced to the margin of the lesion. Once needle tip position was confirmed, coaxial 18-gauge core biopsy samples were obtained, submitted in formalin to surgical pathology. The guide needle was removed. Postprocedure scans show no pneumothorax. Small amount of peripheral alveolar hemorrhage. COMPLICATIONS: Self-limited hemoptysis and mild hypoxia which responded to supplemental O2. SIR level B: Nominal therapy (including overnight admission for observation), no consequence. FINDINGS: Left lower lobe nodule was similar in appearance to prior PET-CT. Representative core biopsy samples obtained as above. Patient had mild self limited hemoptysis postprocedure with mild hypoxia which responded to supplemental O2. She remained otherwise stable and will be kept for overnight observation , and follow-up chest radiography. IMPRESSION: 1. Technically successful CT-guided core biopsy, left lower lobe lung nodule. 2. Extended observation secondary to mild self-limited hemoptysis and hypoxia which responded to supplemental O2. Results were called by telephone at the time of interpretation on 03/09/2019 at 3:02 pm to providerHARRELL LIGHTFOOT , who verbally acknowledged these results. Electronically Signed   By: Lucrezia Europe M.D.   On: 03/09/2019 15:04   Dg Chest Port 1 View  Result Date: 03/09/2019 CLINICAL DATA:  Hemoptysis after lung biopsy. EXAM: PORTABLE CHEST 1 VIEW COMPARISON:  Chest x-rays and CT  biopsy from same day. FINDINGS: The heart size and mediastinal contours are within normal limits. Atherosclerotic calcification of the aortic arch. Normal pulmonary vascularity. Unchanged patchy airspace disease at the left lung base. No pneumothorax or pleural effusion. No acute osseous abnormality. IMPRESSION: 1. No pneumothorax. The suspected pleural line on the prior x-ray represented a skin fold as it extended beyond the margins of the lung. 2. Unchanged post biopsy hemorrhage in the left lower lobe. Electronically Signed   By: Titus Dubin M.D.   On: 03/09/2019 20:06   Dg Chest Port 1 View  Result Date: 03/09/2019 CLINICAL DATA:  Hemoptysis post left lung needle biopsy EXAM: PORTABLE CHEST 1 VIEW COMPARISON:  03/09/2019 FINDINGS: Progression of left lower lobe airspace disease most likely post biopsy hemorrhage. Interval development of left pneumothorax approximately 2.5 mm. Minimal left effusion Right lung clear.  Atherosclerotic aortic arch. IMPRESSION: Progression of left lower lobe airspace disease compatible with hemorrhage post biopsy. Interval development of moderate left pneumothorax. These results will be called to the ordering clinician or representative by the Radiologist Assistant, and communication documented in the PACS or zVision Dashboard. Electronically Signed   By: Franchot Gallo M.D.   On: 03/09/2019 15:25   Dg Chest Port 1 View  Result Date: 03/09/2019 CLINICAL DATA:  Hemoptysis.  Lung biopsy. EXAM: PORTABLE CHEST 1 VIEW COMPARISON:  January 31, 2019. FINDINGS: Stable cardiomediastinal silhouette. Atherosclerosis of thoracic aorta is noted. Stable right basilar scarring is noted. Slightly increased left basilar opacity is noted which may  represent pulmonary hemorrhage secondary to biopsy. Bony thorax is unremarkable. No pneumothorax or significant effusion is noted. IMPRESSION: No pneumothorax status post left lung biopsy. Slightly increased left basilar density is noted which  may represent pulmonary hemorrhage secondary to biopsy. Aortic Atherosclerosis (ICD10-I70.0). Electronically Signed   By: Marijo Conception M.D.   On: 03/09/2019 12:54   US Thyroid  Result Date: 02/15/2019 CLINICAL DATA:  85 year old female with a thyroid nodule EXAM: THYROID ULTRASOUND TECHNIQUE: Ultrasound examination of the thyroid gland and adjacent soft tissues was performed. COMPARISON:  None. FINDINGS: Parenchymal Echotexture: Normal Isthmus: 3.4 cm Right lobe: 3.6 cm x 1.4 cm x 1.1 cm Left lobe: 4.2 cm x 1.8 cm x 1.7 cm _________________________________________________________ Estimated total number of nodules >/= 1 cm: 1 Number of spongiform nodules >/=  2 cm not described below (TR1): 0 Number of mixed cystic and solid nodules >/= 1.5 cm not described below (TR2): 0 _________________________________________________________ Nodule # 1: Location: Left; Inferior Maximum size: 2.6 cm; Other 2 dimensions: 2.1 cm x 2.1 cm Composition: mixed cystic and solid (1) Echogenicity: isoechoic (1) Shape: taller-than-wide (3) Margins: smooth (0) Echogenic foci: none (0) ACR TI-RADS total points: 5. ACR TI-RADS risk category: TR4 (4-6 points). ACR TI-RADS recommendations: Nodule meets criteria for biopsy _________________________________________________________ No adenopathy IMPRESSION: Left inferior thyroid nodule (labeled 1, TR 4) meets criteria for biopsy, as designated by the newly established ACR TI-RADS criteria, and referral for biopsy is recommended. Recommendations follow those established by the new ACR TI-RADS criteria (J Am Coll Radiol 6754;49:201-007). Electronically Signed   By: Corrie Mckusick D.O.   On: 02/15/2019 09:31      Assessment & Plan  Acute respiratory failure status post left lung biopsy for lung nodule Status post rapid response Patient is transferred to [facility] the hospitalist service Patient is a smoker and has a history of asthma We will start Solu-Medrol and duo nebs every 6 hours  in addition to [place] on oxygen per nasal cannula  Acute blood loss anemia with hemoptysis Monitor hemoglobin in a.m.  History of hypertension Losartan is placed on hold at this time for possible volume status changes  DVT Prophylaxis SCDs  AM Labs Ordered, also please review Full Orders  Family Communication: Discussed with her son [name] and her daughter [name]  Code Status full  Disposition Plan: Home  Time spent in minutes : 37 minutes  Condition GUARDED   @SIGNATURE @

## 2019-03-11 ENCOUNTER — Inpatient Hospital Stay (HOSPITAL_COMMUNITY): Payer: Medicare HMO

## 2019-03-11 DIAGNOSIS — I10 Essential (primary) hypertension: Secondary | ICD-10-CM

## 2019-03-11 DIAGNOSIS — J9601 Acute respiratory failure with hypoxia: Principal | ICD-10-CM

## 2019-03-11 DIAGNOSIS — J45901 Unspecified asthma with (acute) exacerbation: Secondary | ICD-10-CM

## 2019-03-11 DIAGNOSIS — R0600 Dyspnea, unspecified: Secondary | ICD-10-CM

## 2019-03-11 LAB — BASIC METABOLIC PANEL
Anion gap: 6 (ref 5–15)
BUN: 14 mg/dL (ref 8–23)
CO2: 25 mmol/L (ref 22–32)
Calcium: 9 mg/dL (ref 8.9–10.3)
Chloride: 110 mmol/L (ref 98–111)
Creatinine, Ser: 0.67 mg/dL (ref 0.44–1.00)
GFR calc Af Amer: >60 mL/min (ref >60)
GFR calc non Af Amer: >60 mL/min (ref >60)
Glucose, Bld: 98 mg/dL (ref 70–99)
Potassium: 3.8 mmol/L (ref 3.5–5.1)
Sodium: 141 mmol/L (ref 135–145)

## 2019-03-11 LAB — BLOOD GAS, ARTERIAL
Acid-Base Excess: 1.8 mmol/L (ref 0.0–2.0)
Bicarbonate: 26.5 mmol/L (ref 20.0–28.0)
Drawn by: 414221
O2 Content: 2 L/min
O2 Saturation: 93.9 %
Patient temperature: 98.6
pCO2 arterial: 46.1 mmHg (ref 32.0–48.0)
pH, Arterial: 7.377 (ref 7.350–7.450)
pO2, Arterial: 72.8 mmHg — ABNORMAL LOW (ref 83.0–108.0)

## 2019-03-11 LAB — CBC WITH DIFFERENTIAL/PLATELET
Abs Immature Granulocytes: 0.01 10*3/uL (ref 0.00–0.07)
Basophils Absolute: 0 10*3/uL (ref 0.0–0.1)
Basophils Relative: 1 %
Eosinophils Absolute: 0.3 10*3/uL (ref 0.0–0.5)
Eosinophils Relative: 5 %
HCT: 33.9 % — ABNORMAL LOW (ref 36.0–46.0)
Hemoglobin: 11.1 g/dL — ABNORMAL LOW (ref 12.0–15.0)
Immature Granulocytes: 0 %
Lymphocytes Relative: 22 %
Lymphs Abs: 1.6 10*3/uL (ref 0.7–4.0)
MCH: 29.1 pg (ref 26.0–34.0)
MCHC: 32.7 g/dL (ref 30.0–36.0)
MCV: 89 fL (ref 80.0–100.0)
Monocytes Absolute: 0.6 10*3/uL (ref 0.1–1.0)
Monocytes Relative: 9 %
Neutro Abs: 4.6 10*3/uL (ref 1.7–7.7)
Neutrophils Relative %: 63 %
Platelets: 241 10*3/uL (ref 150–400)
RBC: 3.81 MIL/uL — ABNORMAL LOW (ref 3.87–5.11)
RDW: 13.6 % (ref 11.5–15.5)
WBC: 7.3 10*3/uL (ref 4.0–10.5)
nRBC: 0 % (ref 0.0–0.2)

## 2019-03-11 MED ORDER — IPRATROPIUM-ALBUTEROL 0.5-2.5 (3) MG/3ML IN SOLN
3.0000 mL | Freq: Four times a day (QID) | RESPIRATORY_TRACT | Status: DC
  Administered 2019-03-11 – 2019-03-12 (×5): 3 mL via RESPIRATORY_TRACT
  Filled 2019-03-11 (×7): qty 3

## 2019-03-11 MED ORDER — FUROSEMIDE 10 MG/ML IJ SOLN
40.0000 mg | Freq: Once | INTRAMUSCULAR | Status: AC
  Administered 2019-03-11: 40 mg via INTRAVENOUS
  Filled 2019-03-11: qty 4

## 2019-03-11 MED ORDER — METHYLPREDNISOLONE SODIUM SUCC 125 MG IJ SOLR: 60.0000 mg | Freq: Three times a day (TID) | INTRAMUSCULAR | Status: DC

## 2019-03-11 MED ORDER — MOMETASONE FURO-FORMOTEROL FUM 200-5 MCG/ACT IN AERO
2.0000 | INHALATION_SPRAY | Freq: Two times a day (BID) | RESPIRATORY_TRACT | Status: DC
  Administered 2019-03-12 – 2019-03-15 (×6): 2 via RESPIRATORY_TRACT
  Filled 2019-03-11: qty 8.8

## 2019-03-11 MED ORDER — ALBUTEROL SULFATE (2.5 MG/3ML) 0.083% IN NEBU
2.5000 mg | INHALATION_SOLUTION | RESPIRATORY_TRACT | Status: DC | PRN
  Administered 2019-03-13: 2.5 mg via RESPIRATORY_TRACT
  Filled 2019-03-11: qty 3

## 2019-03-11 MED ORDER — MOMETASONE FURO-FORMOTEROL FUM 100-5 MCG/ACT IN AERO: 2.0000 | INHALATION_SPRAY | Freq: Two times a day (BID) | RESPIRATORY_TRACT | Status: DC

## 2019-03-11 MED ORDER — METHYLPREDNISOLONE SODIUM SUCC 40 MG IJ SOLR
40.0000 mg | Freq: Three times a day (TID) | INTRAMUSCULAR | Status: DC
  Administered 2019-03-11 – 2019-03-12 (×3): 40 mg via INTRAVENOUS
  Filled 2019-03-11 (×3): qty 1

## 2019-03-11 NOTE — Progress Notes (Signed)
  Echocardiogram 2D Echocardiogram has been performed.  Sandra Cooper 03/11/2019, 6:18 PM

## 2019-03-11 NOTE — Progress Notes (Signed)
Referring Physician(s): Hassell,Daniel  Supervising Physician: Arne Cleveland  Patient Status:  [facility] - In-pt  Chief Complaint: Hemoptysis s/p lung biopsy 10/8  Subjective: Patient with continued shortness of breath this AM.  She seemed to have plateaued at 3L [place], but reports increased shortness of breath with minimal exertion.  She typically lives at home alone and is independent for all ADLs.   She is otherwise stable.  No tachycardia at rest. No fever.  She did cough up some dark phlegm this AM likely post-procedural.   Daugther, [name] at bedside this AM.   Allergies: Fluoxetine  Medications: Prior to Admission medications   Medication Sig Start Date End Date Taking? Authorizing Provider  albuterol (VENTOLIN HFA) 108 (90 Base) MCG/ACT inhaler Inhale 2 puffs into the lungs every 6 (six) hours as needed for wheezing or shortness of breath.  09/02/18  Yes [provider]  amLODipine (NORVASC) 10 MG tablet Take 10 mg by mouth daily. 02/24/19  Yes [provider]  aspirin EC 81 MG tablet Take 81 mg by mouth at bedtime.    Yes [provider]  Calcium Carb-Cholecalciferol (CALCIUM-VITAMIN D) 600-400 MG-UNIT TABS Take 1 tablet by mouth daily.   Yes [provider]  Camphor-Menthol-Methyl Sal (SALONPAS) 3.06-06-08 % PTCH Place 1 patch onto the skin daily as needed (pain).   Yes [provider]  Cholecalciferol (VITAMIN D) 50 MCG (2000 UT) CAPS Take 2,000 Units by mouth daily.   Yes [provider]  DHA-Vitamin C-Lutein (EYE HEALTH FORMULA PO) Take 1 tablet by mouth daily.   Yes [provider]  ibuprofen (ADVIL) 200 MG tablet Take 200-600 mg by mouth daily as needed for headache or mild pain.    Yes [provider]  losartan (COZAAR) 100 MG tablet Take 100 mg by mouth every evening. 01/21/19  Yes [provider]  magnesium gluconate (MAGONATE) 500 MG tablet Take 1,000 mg by mouth daily.    Yes [provider]  mirtazapine (REMERON) 15 MG tablet Take 15 mg by mouth at bedtime. 11/29/18  Yes [provider]  Multiple Vitamin (MULTIVITAMIN) tablet Take 1 tablet by mouth daily.   Yes [provider]  Omega-3-6-9 CAPS Take 1 capsule by mouth 2 (two) times daily.    Yes [provider]  OVER THE COUNTER MEDICATION Take 1 tablet by mouth daily. Life Extension, Brain Support Supplement.   Yes [provider]  Potassium 99 MG TABS Take 99 mg by mouth daily.   Yes [provider]  vitamin B-12 (CYANOCOBALAMIN) 500 MCG tablet Take 500 mcg by mouth daily.   Yes [provider]     Vital Signs: BP 134/86 (BP Location: Right Arm)    Pulse 72    Temp 98.4 F (36.9 C) (Oral)    Resp 18    SpO2 98%   Physical Exam Vitals signs and nursing note reviewed.   NAD, resting comfortably in bed on 3L [place], becomes short of breath with prolonged conversation.  Chest: Lungs clear to auscultation, no audible wheeze.  Puncture site intact.   Imaging: Dg Chest 2 View  Result Date: 03/10/2019 CLINICAL DATA:  Hemoptysis EXAM: CHEST - 2 VIEW COMPARISON:  None. FINDINGS: The heart size is normal. Vascular calcifications are seen in the aortic arch. An airspace opacity in the left lung base posteriorly likely represents the patient's previously biopsied pulmonary nodule with surrounding post biopsy hemorrhage. The right lung is clear. There is a tiny left pleural  effusion. There is no pneumothorax. The visualized skeletal structures are unremarkable. IMPRESSION: 1. Tiny left pleural effusion. No pneumothorax. 2. Left basilar airspace opacity, likely the patient's previously biopsied pulmonary nodule with surrounding post biopsy hemorrhage. Aortic Atherosclerosis (ICD10-I70.0). Electronically Signed   By: Zerita Boers M.D.   On: 03/10/2019 12:58   Dg Chest 2 View  Result Date: 03/10/2019 CLINICAL DATA:  Shortness of breath. Hemoptysis. Recent left lung biopsy.  EXAM: CHEST - 2 VIEW COMPARISON:  Multiple chest x-rays dated 03/09/2019 and CT scan dated 01/31/2019 FINDINGS: The heart size and pulmonary vascularity are normal. The haziness at the left lung base has improved consistent with resolving post biopsy hemorrhage. Minimal blunting of the left costophrenic angle consistent with a tiny effusion. Right lung is clear. Both lungs are hyperinflated. Aortic atherosclerosis. No acute bone abnormality. Old compression fracture in the upper thoracic spine. IMPRESSION: 1. No acute abnormalities.  No pneumothorax. 2. Tiny left pleural effusion. 3. Improved haziness at the left lung base consistent with resolving post biopsy hemorrhage. 4. Aortic atherosclerosis. Electronically Signed   By: Lorriane Shire M.D.   On: 03/10/2019 10:47   Ct Biopsy  Result Date: 03/09/2019 CLINICAL DATA:  Hypermetabolic left lower lobe lung nodule EXAM: CT GUIDED CORE BIOPSY OF LEFT LOWER LOBE LUNG NODULE ANESTHESIA/SEDATION: Intravenous Fentanyl 38mcg and Versed 1mg  were administered as conscious sedation during continuous monitoring of the patient's level of consciousness and physiological / cardiorespiratory status by the radiology RN, with a total moderate sedation time of 14 minutes. PROCEDURE: The procedure risks, benefits, and alternatives were explained to the patient. Questions regarding the procedure were encouraged and answered. The patient understands and consents to the procedure. Patient placed supine. Select axial scans through the thorax were obtained. The left lower lobe lesion was localized. It was unchanged in size from previous PET-CT. An appropriate skin entry site was determined and marked. The operative field was prepped with chlorhexidinein a sterile fashion, and a sterile drape was applied covering the operative field. A sterile gown and sterile gloves were used for the procedure. Local anesthesia was provided with 1% Lidocaine. Under CT fluoroscopic guidance, a 17 gauge  trocar needle was advanced to the margin of the lesion. Once needle tip position was confirmed, coaxial 18-gauge core biopsy samples were obtained, submitted in formalin to surgical pathology. The guide needle was removed. Postprocedure scans show no pneumothorax. Small amount of peripheral alveolar hemorrhage. COMPLICATIONS: Self-limited hemoptysis and mild hypoxia which responded to supplemental O2. SIR level B: Nominal therapy (including overnight admission for observation), no consequence. FINDINGS: Left lower lobe nodule was similar in appearance to prior PET-CT. Representative core biopsy samples obtained as above. Patient had mild self limited hemoptysis postprocedure with mild hypoxia which responded to supplemental O2. She remained otherwise stable and will be kept for overnight observation , and follow-up chest radiography. IMPRESSION: 1. Technically successful CT-guided core biopsy, left lower lobe lung nodule. 2. Extended observation secondary to mild self-limited hemoptysis and hypoxia which responded to supplemental O2. Results were called by telephone at the time of interpretation on 03/09/2019 at 3:02 pm to providerHARRELL LIGHTFOOT , who verbally acknowledged these results. Electronically Signed   By: Lucrezia Europe M.D.   On: 03/09/2019 15:04   Dg Chest Port 1 View  Result Date: 03/11/2019 CLINICAL DATA:  Hypoxemia. EXAM: PORTABLE CHEST 1 VIEW COMPARISON:  One day prior FINDINGS: Osteopenia. Hyperinflation. Midline trachea. Mild cardiomegaly. Atherosclerosis in the transverse aorta. No pleural effusion or pneumothorax. Persistent patchy left base opacity.  IMPRESSION: No significant change in hyperinflation with mild left base airspace disease, likely atelectasis and/or hemorrhage after biopsy. Aortic Atherosclerosis (ICD10-I70.0). Electronically Signed   By: Abigail Miyamoto M.D.   On: 03/11/2019 10:19   Dg Chest Port 1 View  Result Date: 03/09/2019 CLINICAL DATA:  Hemoptysis after lung biopsy.  EXAM: PORTABLE CHEST 1 VIEW COMPARISON:  Chest x-rays and CT biopsy from same day. FINDINGS: The heart size and mediastinal contours are within normal limits. Atherosclerotic calcification of the aortic arch. Normal pulmonary vascularity. Unchanged patchy airspace disease at the left lung base. No pneumothorax or pleural effusion. No acute osseous abnormality. IMPRESSION: 1. No pneumothorax. The suspected pleural line on the prior x-ray represented a skin fold as it extended beyond the margins of the lung. 2. Unchanged post biopsy hemorrhage in the left lower lobe. Electronically Signed   By: Titus Dubin M.D.   On: 03/09/2019 20:06   Dg Chest Port 1 View  Result Date: 03/09/2019 CLINICAL DATA:  Hemoptysis post left lung needle biopsy EXAM: PORTABLE CHEST 1 VIEW COMPARISON:  03/09/2019 FINDINGS: Progression of left lower lobe airspace disease most likely post biopsy hemorrhage. Interval development of left pneumothorax approximately 2.5 mm. Minimal left effusion Right lung clear.  Atherosclerotic aortic arch. IMPRESSION: Progression of left lower lobe airspace disease compatible with hemorrhage post biopsy. Interval development of moderate left pneumothorax. These results will be called to the ordering clinician or representative by the Radiologist Assistant, and communication documented in the PACS or zVision Dashboard. Electronically Signed   By: Franchot Gallo M.D.   On: 03/09/2019 15:25   Dg Chest Port 1 View  Result Date: 03/09/2019 CLINICAL DATA:  Hemoptysis.  Lung biopsy. EXAM: PORTABLE CHEST 1 VIEW COMPARISON:  January 31, 2019. FINDINGS: Stable cardiomediastinal silhouette. Atherosclerosis of thoracic aorta is noted. Stable right basilar scarring is noted. Slightly increased left basilar opacity is noted which may represent pulmonary hemorrhage secondary to biopsy. Bony thorax is unremarkable. No pneumothorax or significant effusion is noted. IMPRESSION: No pneumothorax status post left lung  biopsy. Slightly increased left basilar density is noted which may represent pulmonary hemorrhage secondary to biopsy. Aortic Atherosclerosis (ICD10-I70.0). Electronically Signed   By: Marijo Conception M.D.   On: 03/09/2019 12:54    Labs:  CBC: Recent Labs    01/31/19 1221 03/09/19 0929 03/10/19 1059 03/11/19 0158  WBC 5.7 5.6  --  7.3  HGB 14.1 15.2* 11.5* 11.1*  HCT 43.9 46.6* 35.1* 33.9*  PLT 300 336  --  241    COAGS: Recent Labs    03/09/19 0929  INR 0.9    BMP: Recent Labs    01/31/19 1221 03/11/19 0158  NA 138 141  K 3.9 3.8  CL 103 110  CO2 25 25  GLUCOSE 98 98  BUN 19 14  CALCIUM 9.4 9.0  CREATININE 0.81 0.67  GFRNONAA >60 >60  GFRAA >60 >60    LIVER FUNCTION TESTS: Recent Labs    01/31/19 1221  BILITOT 0.9  AST 21  ALT 20  ALKPHOS 73  PROT 7.2  ALBUMIN 4.1    Assessment and Plan: Hemoptysis s/p lung biopsy 10/9, resolved Hypoxia  Patient with ongoing hypoxia.  She has been unable to wean from 3L [place].  Easily short of breath.  Has a history of asthma.  Hospitalist consulted for assistance with O2 management which is greatly appreciated. CXR for the past 2 days has remained stable; consistent with post-procedural changes with small hemorrhage.  MD repeating 2V  this afternoon.  Hemoglobin appears stable at 11.1.  IR will continue to follow while inpatient.   Spoke with daughter at length at bedside.  They are understandably concerned about patient's worsened shortness of breath from baseline due to advanced age and patient lives at home alone.   Electronically Signed: Docia Barrier, PA 03/11/2019, 12:28 PM   I spent a total of 15 Minutes at the the patient's bedside AND on the patient's hospital floor or unit, greater than 50% of which was counseling/coordinating care for hemoptysis, hypoxia.

## 2019-03-11 NOTE — Evaluation (Signed)
Physical Therapy Evaluation Patient Details Name: Sandra Cooper MRN: [id number] DOB: 11/10/1929 Today's Date: 03/11/2019   History of Present Illness  Patient is an 85 year old female admitted with hypoxia. Had lung biopsy and since then has had hypoxia. PMH to inlcude: asthma, HTN, lung nodule.  Clinical Impression  Patient received in bed, reports SOB, was not able to eat much lunch. O2 sats on 2 lpm via nasal canula at 94%. Patient needs min guard for bed mobility and transfers. Increased time and effort needed with all mobility. Ambulated with RW 15' around her bed to recliner, min guard. Very slow pace. Occasional coughing during ambulation with reported chest tightness. Patient will continue to benefit from skilled PT while here to improve strength and activity tolerance.       Follow Up Recommendations SNF may need if she does not progress, otherwise HHPT    Equipment Recommendations  Rolling walker with 5" wheels    Recommendations for Other Services       Precautions / Restrictions Precautions Precautions: Fall Restrictions Weight Bearing Restrictions: No      Mobility  Bed Mobility Overal bed mobility: Needs Assistance Bed Mobility: Supine to Sit     Supine to sit: Min guard        Transfers Overall transfer level: Needs assistance Equipment used: Rolling walker (2 wheeled) Transfers: Sit to/from Stand Sit to Stand: Min guard            Ambulation/Gait Ambulation/Gait assistance: Min guard Gait Distance (Feet): 15 Feet Assistive device: Rolling walker (2 wheeled) Gait Pattern/deviations: Step-to pattern;Decreased stride length Gait velocity: very slow   General Gait Details: patient ambulates with increased time and effort. Wants to lean forearms on walker.  Stairs            Wheelchair Mobility    Modified Rankin (Stroke Patients Only)       Balance Overall balance assessment: Needs assistance Sitting-balance support: Feet  supported Sitting balance-Leahy Scale: Good     Standing balance support: Bilateral upper extremity supported Standing balance-Leahy Scale: Fair Standing balance comment: reliant on RW                             Pertinent Vitals/Pain Pain Assessment: (does not reports pain, however reports chest tightness and like someone is sitting on her chest.)    Home Living Family/patient expects to be discharged to:: Private residence Living Arrangements: Alone Available Help at Discharge: Family;Available PRN/intermittently Type of Home: House Home Access: Stairs to enter   [employer] of Steps: 3 Home Layout: One level Home Equipment: Cane - single point Additional Comments: walked around in the house without anything, used cane when out of the home    Prior Function Level of Independence: Independent;Independent with assistive device(s)               Hand Dominance        Extremity/Trunk Assessment   Upper Extremity Assessment Upper Extremity Assessment: Overall WFL for tasks assessed    Lower Extremity Assessment Lower Extremity Assessment: Generalized weakness    Cervical / Trunk Assessment Cervical / Trunk Assessment: Normal  Communication   Communication: No difficulties  Cognition Arousal/Alertness: Awake/alert Behavior During Therapy: WFL for tasks assessed/performed Overall Cognitive Status: Within Functional Limits for tasks assessed  General Comments      Exercises     Assessment/Plan    PT Assessment Patient needs continued PT services  PT Problem List Decreased strength;Decreased mobility;Decreased activity tolerance;Cardiopulmonary status limiting activity       PT Treatment Interventions DME instruction;Therapeutic activities;Gait training;Therapeutic exercise;Patient/family education;Stair training;Functional mobility training    PT Goals (Current goals can be  found in the Care Plan section)  Acute Rehab PT Goals Patient Stated Goal: to breathe better PT Goal Formulation: With patient/family Time For Goal Achievement: 03/18/19 Potential to Achieve Goals: Fair    Frequency Min 3X/week   Barriers to discharge Decreased caregiver support patient lives alone, her daughter is here from [place] until Thursday    Co-evaluation               AM-PAC PT "6 Clicks" Mobility  Outcome Measure Help needed turning from your back to your side while in a flat bed without using bedrails?: A Little Help needed moving from lying on your back to sitting on the side of a flat bed without using bedrails?: A Little Help needed moving to and from a bed to a chair (including a wheelchair)?: A Little Help needed standing up from a chair using your arms (e.g., wheelchair or bedside chair)?: A Little Help needed to walk in hospital room?: A Little Help needed climbing 3-5 steps with a railing? : A Lot 6 Click Score: 17    End of Session Equipment Utilized During Treatment: Gait belt;Oxygen Activity Tolerance: Patient limited by fatigue Patient left: in chair;with call bell/phone within reach;with family/visitor present Nurse Communication: Mobility status PT Visit Diagnosis: Muscle weakness (generalized) (M62.81);Difficulty in walking, not elsewhere classified (R26.2);Other abnormalities of gait and mobility (R26.89)    Time: 9518-8416 PT Time Calculation (min) (ACUTE ONLY): 35 min   Charges:   PT Evaluation $PT Eval Moderate Complexity: 1 Mod PT Treatments $Gait Training: 8-22 mins        Colbin Jovel, PT, GCS 03/11/19,2:56 PM

## 2019-03-11 NOTE — Progress Notes (Addendum)
Patient ID: Shametra Cumberland, female   DOB: 05-14-1930, 85 y.o.   MRN: [id number]  PROGRESS NOTE    Ruchi Stoney  PNT:[id number] DOB: 01/13/1930 DOA: 03/09/2019 PCP: [name], MD   Brief Narrative:  85 year old female with history of asthma, hypertension and lung nodule status post lung biopsy by IR on 03/09/2019 and subsequently developed hemoptysis and increased oxygen requirement and had to be subsequently admitted under our service.  Hospitalist service was consulted for hypoxemia.  Assessment & Plan:   Acute hypoxic respiratory failure after IR guided lung biopsy on 03/09/2019 for a lung nodule Question of asthma exacerbation -Still requiring 3 L oxygen.  IR following.  Will get repeat 2 view chest x-ray today. -We will start Solu-Medrol and Dulera along with nebs as needed as mentioned by prior hospitalist. -We will also give 1 dose of Lasix.  2D echo.  If hypoxia does not improve, consider CT of the chest and/or pulmonary evaluation.  Acute blood loss anemia Hemoptysis -Most likely from the lung biopsy.  No current hemoptysis.  Hemoglobin 11.1 this morning.  Hypertension  -Monitor blood pressure.  Continue amlodipine and losartan.  Spoke to [employer] on phone on 03/11/2019  Subjective: Patient seen and examined at bedside.  She still feels short of breath with even minimal exertion.  No overnight fever, vomiting or hemoptysis.  Objective: Vitals:   03/10/19 1845 03/10/19 2115 03/11/19 0700 03/11/19 0735  BP:  134/86    Pulse:  72    Resp:   (!) 25 18  Temp:  98.4 F (36.9 C)    TempSrc:  Oral    SpO2: 94% 95% 94% 98%    Intake/Output Summary (Last 24 hours) at 03/11/2019 1053 Last data filed at 03/11/2019 0655 Gross per 24 hour  Intake 846.04 ml  Output 3 ml  Net 843.04 ml   There were no vitals filed for this visit.  Examination:  General exam: Appears calm and comfortable.  Elderly female lying in bed. Respiratory system: Bilateral decreased breath  sounds at bases with some scattered crackles Cardiovascular system: S1 & S2 heard, Rate controlled Gastrointestinal system: Abdomen is nondistended, soft and nontender. Normal bowel sounds heard. Extremities: No cyanosis, clubbing, edema   Data Reviewed: I have personally reviewed following labs and imaging studies  CBC: Recent Labs  Lab 03/09/19 0929 03/10/19 1059 03/11/19 0158  WBC 5.6  --  7.3  NEUTROABS  --   --  4.6  HGB 15.2* 11.5* 11.1*  HCT 46.6* 35.1* 33.9*  MCV 89.3  --  89.0  PLT 336  --  086   Basic Metabolic Panel: Recent Labs  Lab 03/11/19 0158  NA 141  K 3.8  CL 110  CO2 25  GLUCOSE 98  BUN 14  CREATININE 0.67  CALCIUM 9.0   GFR: Estimated Creatinine Clearance: 35.6 mL/min (by C-G formula based on SCr of 0.67 mg/dL). Liver Function Tests: No results for input(s): AST, ALT, ALKPHOS, BILITOT, PROT, ALBUMIN in the last 168 hours. No results for input(s): LIPASE, AMYLASE in the last 168 hours. No results for input(s): AMMONIA in the last 168 hours. Coagulation Profile: Recent Labs  Lab 03/09/19 0929  INR 0.9   Cardiac Enzymes: No results for input(s): CKTOTAL, CKMB, CKMBINDEX, TROPONINI in the last 168 hours. BNP (last 3 results) No results for input(s): PROBNP in the last 8760 hours. HbA1C: No results for input(s): HGBA1C in the last 72 hours. CBG: No results for input(s): GLUCAP in the last 168 hours.  Lipid Profile: No results for input(s): CHOL, HDL, LDLCALC, TRIG, CHOLHDL, LDLDIRECT in the last 72 hours. Thyroid Function Tests: No results for input(s): TSH, T4TOTAL, FREET4, T3FREE, THYROIDAB in the last 72 hours. Anemia Panel: No results for input(s): VITAMINB12, FOLATE, FERRITIN, TIBC, IRON, RETICCTPCT in the last 72 hours. Sepsis Labs: No results for input(s): PROCALCITON, LATICACIDVEN in the last 168 hours.  Recent Results (from the past 240 hour(s))  Novel Coronavirus, NAA (Hosp order, Send-out to Ref Lab; TAT 18-24 hrs     Status:  None   Collection Time: 03/06/19  9:54 AM   Specimen: Nasopharyngeal Swab; Respiratory  Result Value Ref Range Status   SARS-CoV-2, NAA NOT DETECTED NOT DETECTED Final    Comment: (NOTE) This nucleic acid amplification test was developed and its performance characteristics determined by [employer]. Nucleic acid amplification tests include PCR and TMA. This test has not been FDA cleared or approved. This test has been authorized by FDA under an Emergency Use Authorization (EUA). This test is only authorized for the duration of time the declaration that circumstances exist justifying the authorization of the emergency use of in vitro diagnostic tests for detection of SARS-CoV-2 virus and/or diagnosis of COVID-19 infection under section 564(b)(1) of the Act, 21 U.S.C. 629UTM-5(Y) (1), unless the authorization is terminated or revoked sooner. When diagnostic testing is negative, the possibility of a false negative result should be considered in the context of a patient's recent exposures and the presence of clinical signs and symptoms consistent with COVID-19. An individual without symptoms of COVID- 19 and who is not shedding SARS-CoV-2 vi rus would expect to have a negative (not detected) result in this assay. Performed At: [facility] [address] [place], [place] [id number] Rush Farmer MD CL:[id number]    [place]  Final    Comment: Performed at [facility], [place] [address] [place], [place] [address]         Radiology Studies: Dg Chest 2 View  Result Date: 03/10/2019 CLINICAL DATA:  Hemoptysis EXAM: CHEST - 2 VIEW COMPARISON:  None. FINDINGS: The heart size is normal. Vascular calcifications are seen in the aortic arch. An airspace opacity in the left lung base posteriorly likely represents the patient's previously biopsied pulmonary nodule with surrounding post biopsy hemorrhage. The right lung is clear. There is a tiny  left pleural effusion. There is no pneumothorax. The visualized skeletal structures are unremarkable. IMPRESSION: 1. Tiny left pleural effusion. No pneumothorax. 2. Left basilar airspace opacity, likely the patient's previously biopsied pulmonary nodule with surrounding post biopsy hemorrhage. Aortic Atherosclerosis (ICD10-I70.0). Electronically Signed   By: Zerita Boers M.D.   On: 03/10/2019 12:58   Dg Chest 2 View  Result Date: 03/10/2019 CLINICAL DATA:  Shortness of breath. Hemoptysis. Recent left lung biopsy. EXAM: CHEST - 2 VIEW COMPARISON:  Multiple chest x-rays dated 03/09/2019 and CT scan dated 01/31/2019 FINDINGS: The heart size and pulmonary vascularity are normal. The haziness at the left lung base has improved consistent with resolving post biopsy hemorrhage. Minimal blunting of the left costophrenic angle consistent with a tiny effusion. Right lung is clear. Both lungs are hyperinflated. Aortic atherosclerosis. No acute bone abnormality. Old compression fracture in the upper thoracic spine. IMPRESSION: 1. No acute abnormalities.  No pneumothorax. 2. Tiny left pleural effusion. 3. Improved haziness at the left lung base consistent with resolving post biopsy hemorrhage. 4. Aortic atherosclerosis. Electronically Signed   By: Lorriane Shire M.D.   On: 03/10/2019 10:47  Ct Biopsy  Result Date: 03/09/2019 CLINICAL DATA:  Hypermetabolic left lower lobe lung nodule EXAM: CT GUIDED CORE BIOPSY OF LEFT LOWER LOBE LUNG NODULE ANESTHESIA/SEDATION: Intravenous Fentanyl 66mcg and Versed 1mg  were administered as conscious sedation during continuous monitoring of the patient's level of consciousness and physiological / cardiorespiratory status by the radiology RN, with a total moderate sedation time of 14 minutes. PROCEDURE: The procedure risks, benefits, and alternatives were explained to the patient. Questions regarding the procedure were encouraged and answered. The patient understands and consents to the  procedure. Patient placed supine. Select axial scans through the thorax were obtained. The left lower lobe lesion was localized. It was unchanged in size from previous PET-CT. An appropriate skin entry site was determined and marked. The operative field was prepped with chlorhexidinein a sterile fashion, and a sterile drape was applied covering the operative field. A sterile gown and sterile gloves were used for the procedure. Local anesthesia was provided with 1% Lidocaine. Under CT fluoroscopic guidance, a 17 gauge trocar needle was advanced to the margin of the lesion. Once needle tip position was confirmed, coaxial 18-gauge core biopsy samples were obtained, submitted in formalin to surgical pathology. The guide needle was removed. Postprocedure scans show no pneumothorax. Small amount of peripheral alveolar hemorrhage. COMPLICATIONS: Self-limited hemoptysis and mild hypoxia which responded to supplemental O2. SIR level B: Nominal therapy (including overnight admission for observation), no consequence. FINDINGS: Left lower lobe nodule was similar in appearance to prior PET-CT. Representative core biopsy samples obtained as above. Patient had mild self limited hemoptysis postprocedure with mild hypoxia which responded to supplemental O2. She remained otherwise stable and will be kept for overnight observation , and follow-up chest radiography. IMPRESSION: 1. Technically successful CT-guided core biopsy, left lower lobe lung nodule. 2. Extended observation secondary to mild self-limited hemoptysis and hypoxia which responded to supplemental O2. Results were called by telephone at the time of interpretation on 03/09/2019 at 3:02 pm to providerHARRELL LIGHTFOOT , who verbally acknowledged these results. Electronically Signed   By: Lucrezia Europe M.D.   On: 03/09/2019 15:04   Dg Chest Port 1 View  Result Date: 03/11/2019 CLINICAL DATA:  Hypoxemia. EXAM: PORTABLE CHEST 1 VIEW COMPARISON:  One day prior FINDINGS:  Osteopenia. Hyperinflation. Midline trachea. Mild cardiomegaly. Atherosclerosis in the transverse aorta. No pleural effusion or pneumothorax. Persistent patchy left base opacity. IMPRESSION: No significant change in hyperinflation with mild left base airspace disease, likely atelectasis and/or hemorrhage after biopsy. Aortic Atherosclerosis (ICD10-I70.0). Electronically Signed   By: Abigail Miyamoto M.D.   On: 03/11/2019 10:19   Dg Chest Port 1 View  Result Date: 03/09/2019 CLINICAL DATA:  Hemoptysis after lung biopsy. EXAM: PORTABLE CHEST 1 VIEW COMPARISON:  Chest x-rays and CT biopsy from same day. FINDINGS: The heart size and mediastinal contours are within normal limits. Atherosclerotic calcification of the aortic arch. Normal pulmonary vascularity. Unchanged patchy airspace disease at the left lung base. No pneumothorax or pleural effusion. No acute osseous abnormality. IMPRESSION: 1. No pneumothorax. The suspected pleural line on the prior x-ray represented a skin fold as it extended beyond the margins of the lung. 2. Unchanged post biopsy hemorrhage in the left lower lobe. Electronically Signed   By: Titus Dubin M.D.   On: 03/09/2019 20:06   Dg Chest Port 1 View  Result Date: 03/09/2019 CLINICAL DATA:  Hemoptysis post left lung needle biopsy EXAM: PORTABLE CHEST 1 VIEW COMPARISON:  03/09/2019 FINDINGS: Progression of left lower lobe airspace disease most likely post biopsy  hemorrhage. Interval development of left pneumothorax approximately 2.5 mm. Minimal left effusion Right lung clear.  Atherosclerotic aortic arch. IMPRESSION: Progression of left lower lobe airspace disease compatible with hemorrhage post biopsy. Interval development of moderate left pneumothorax. These results will be called to the ordering clinician or representative by the Radiologist Assistant, and communication documented in the PACS or zVision Dashboard. Electronically Signed   By: Franchot Gallo M.D.   On: 03/09/2019 15:25    Dg Chest Port 1 View  Result Date: 03/09/2019 CLINICAL DATA:  Hemoptysis.  Lung biopsy. EXAM: PORTABLE CHEST 1 VIEW COMPARISON:  January 31, 2019. FINDINGS: Stable cardiomediastinal silhouette. Atherosclerosis of thoracic aorta is noted. Stable right basilar scarring is noted. Slightly increased left basilar opacity is noted which may represent pulmonary hemorrhage secondary to biopsy. Bony thorax is unremarkable. No pneumothorax or significant effusion is noted. IMPRESSION: No pneumothorax status post left lung biopsy. Slightly increased left basilar density is noted which may represent pulmonary hemorrhage secondary to biopsy. Aortic Atherosclerosis (ICD10-I70.0). Electronically Signed   By: Marijo Conception M.D.   On: 03/09/2019 12:54        Scheduled Meds: . amLODipine  5 mg Oral Daily  . losartan  100 mg Oral QPM  . mirtazapine  15 mg Oral QHS   Continuous Infusions: . sodium chloride Stopped (03/11/19 0759)          Aline August, MD [facility] 03/11/2019, 10:53 AM

## 2019-03-12 DIAGNOSIS — R6520 Severe sepsis without septic shock: Secondary | ICD-10-CM

## 2019-03-12 DIAGNOSIS — O85 Puerperal sepsis: Secondary | ICD-10-CM

## 2019-03-12 DIAGNOSIS — R5381 Other malaise: Secondary | ICD-10-CM

## 2019-03-12 LAB — ECHOCARDIOGRAM COMPLETE
Height: 60 in
Weight: 1760 oz

## 2019-03-12 LAB — CBC WITH DIFFERENTIAL/PLATELET
Abs Immature Granulocytes: 0.02 10*3/uL (ref 0.00–0.07)
Basophils Absolute: 0 10*3/uL (ref 0.0–0.1)
Basophils Relative: 0 %
Eosinophils Absolute: 0 10*3/uL (ref 0.0–0.5)
Eosinophils Relative: 0 %
HCT: 39 % (ref 36.0–46.0)
Hemoglobin: 12.7 g/dL (ref 12.0–15.0)
Immature Granulocytes: 0 %
Lymphocytes Relative: 9 %
Lymphs Abs: 0.5 10*3/uL — ABNORMAL LOW (ref 0.7–4.0)
MCH: 28.9 pg (ref 26.0–34.0)
MCHC: 32.6 g/dL (ref 30.0–36.0)
MCV: 88.6 fL (ref 80.0–100.0)
Monocytes Absolute: 0.1 10*3/uL (ref 0.1–1.0)
Monocytes Relative: 1 %
Neutro Abs: 5.1 10*3/uL (ref 1.7–7.7)
Neutrophils Relative %: 90 %
Platelets: 278 10*3/uL (ref 150–400)
RBC: 4.4 MIL/uL (ref 3.87–5.11)
RDW: 13.4 % (ref 11.5–15.5)
WBC: 5.7 10*3/uL (ref 4.0–10.5)
nRBC: 0 % (ref 0.0–0.2)

## 2019-03-12 LAB — BASIC METABOLIC PANEL
Anion gap: 14 (ref 5–15)
BUN: 19 mg/dL (ref 8–23)
CO2: 23 mmol/L (ref 22–32)
Calcium: 9.5 mg/dL (ref 8.9–10.3)
Chloride: 101 mmol/L (ref 98–111)
Creatinine, Ser: 0.77 mg/dL (ref 0.44–1.00)
GFR calc Af Amer: >60 mL/min (ref >60)
GFR calc non Af Amer: >60 mL/min (ref >60)
Glucose, Bld: 187 mg/dL — ABNORMAL HIGH (ref 70–99)
Potassium: 3.2 mmol/L — ABNORMAL LOW (ref 3.5–5.1)
Sodium: 138 mmol/L (ref 135–145)

## 2019-03-12 LAB — MAGNESIUM: Magnesium: 1.9 mg/dL (ref 1.7–2.4)

## 2019-03-12 MED ORDER — PREDNISONE 20 MG PO TABS
40.0000 mg | ORAL_TABLET | Freq: Every day | ORAL | Status: DC
  Administered 2019-03-13 – 2019-03-15 (×3): 40 mg via ORAL
  Filled 2019-03-12 (×3): qty 2

## 2019-03-12 MED ORDER — POTASSIUM CHLORIDE CRYS ER 20 MEQ PO TBCR
40.0000 meq | EXTENDED_RELEASE_TABLET | ORAL | Status: AC
  Administered 2019-03-12 (×2): 40 meq via ORAL
  Filled 2019-03-12 (×2): qty 2

## 2019-03-12 NOTE — Progress Notes (Signed)
Patient ID: Sandra Cooper, female   DOB: [birth year], 85 y.o.   MRN: [id number]  PROGRESS NOTE    Shakeera Rightmyer  LGX:[id number] DOB: 01/05/1930 DOA: 03/09/2019 PCP: [name], MD   Brief Narrative:  85 year old female with history of asthma, hypertension and lung nodule status post lung biopsy by IR on 03/09/2019 and subsequently developed hemoptysis and increased oxygen requirement and had to be subsequently admitted under our service.  Hospitalist service was consulted for hypoxemia.  Assessment & Plan:   Acute hypoxic respiratory failure after IR guided lung biopsy on 03/09/2019 for a lung nodule Question of asthma exacerbation -Still requiring 2 L oxygen.  IR following.   -Continue nebs and Dulera.  Patient feels jittery.  Will stop Solu-Medrol; she got a dose this morning.  Will start prednisone 40 mg daily from tomorrow. -If hypoxia does not improve, consider CT of the chest and/or pulmonary evaluation. -Follow echo. -Might need supplemental oxygen on discharge.  Acute blood loss anemia Hemoptysis -Most likely from the lung biopsy.  Had some hemoptysis overnight.  Hemoglobin 12.7 this morning.  Hypertension  -Monitor blood pressure.  Continue amlodipine and losartan.  Generalized deconditioning -PT eval  Spoke to [employer] on phone on 03/12/2019  Subjective: Patient seen and examined at bedside.  Still feels short of breath with minimal exertion.  Nursing staff reported intermittent overnight cough with some bloody sputum.    Objective: Vitals:   03/11/19 0700 03/11/19 0735 03/11/19 1439 03/11/19 2138  BP:    131/71  Pulse:    97  Resp: (!) 25 18  18   Temp:    98.1 F (36.7 C)  TempSrc:    Oral  SpO2: 94% 98% 94% 94%    Intake/Output Summary (Last 24 hours) at 03/12/2019 0811 Last data filed at 03/12/2019 0435 Gross per 24 hour  Intake 680 ml  Output -  Net 680 ml   There were no vitals filed for this visit.  Examination:  General exam: No  distress.  Elderly female lying in bed. Respiratory system: Bilateral decreased breath sounds at bases with scattered crackles.  No wheezing  cardiovascular system: Rate controlled, S1-S2 heard Gastrointestinal system: Abdomen is nondistended, soft and nontender. Normal bowel sounds heard. Extremities: No cyanosis, edema  Data Reviewed: I have personally reviewed following labs and imaging studies  CBC: Recent Labs  Lab 03/09/19 0929 03/10/19 1059 03/11/19 0158 03/12/19 0257  WBC 5.6  --  7.3 5.7  NEUTROABS  --   --  4.6 5.1  HGB 15.2* 11.5* 11.1* 12.7  HCT 46.6* 35.1* 33.9* 39.0  MCV 89.3  --  89.0 88.6  PLT 336  --  241 814   Basic Metabolic Panel: Recent Labs  Lab 03/11/19 0158 03/12/19 0257  NA 141 138  K 3.8 3.2*  CL 110 101  CO2 25 23  GLUCOSE 98 187*  BUN 14 19  CREATININE 0.67 0.77  CALCIUM 9.0 9.5  MG  --  1.9   GFR: Estimated Creatinine Clearance: 35.6 mL/min (by C-G formula based on SCr of 0.77 mg/dL). Liver Function Tests: No results for input(s): AST, ALT, ALKPHOS, BILITOT, PROT, ALBUMIN in the last 168 hours. No results for input(s): LIPASE, AMYLASE in the last 168 hours. No results for input(s): AMMONIA in the last 168 hours. Coagulation Profile: Recent Labs  Lab 03/09/19 0929  INR 0.9   Cardiac Enzymes: No results for input(s): CKTOTAL, CKMB, CKMBINDEX, TROPONINI in the last 168 hours. BNP (last 3 results) No results  for input(s): PROBNP in the last 8760 hours. HbA1C: No results for input(s): HGBA1C in the last 72 hours. CBG: No results for input(s): GLUCAP in the last 168 hours. Lipid Profile: No results for input(s): CHOL, HDL, LDLCALC, TRIG, CHOLHDL, LDLDIRECT in the last 72 hours. Thyroid Function Tests: No results for input(s): TSH, T4TOTAL, FREET4, T3FREE, THYROIDAB in the last 72 hours. Anemia Panel: No results for input(s): VITAMINB12, FOLATE, FERRITIN, TIBC, IRON, RETICCTPCT in the last 72 hours. Sepsis Labs: No results for  input(s): PROCALCITON, LATICACIDVEN in the last 168 hours.  Recent Results (from the past 240 hour(s))  Novel Coronavirus, NAA (Hosp order, Send-out to Ref Lab; TAT 18-24 hrs     Status: None   Collection Time: 03/06/19  9:54 AM   Specimen: Nasopharyngeal Swab; Respiratory  Result Value Ref Range Status   SARS-CoV-2, NAA NOT DETECTED NOT DETECTED Final    Comment: (NOTE) This nucleic acid amplification test was developed and its performance characteristics determined by [employer]. Nucleic acid amplification tests include PCR and TMA. This test has not been FDA cleared or approved. This test has been authorized by FDA under an Emergency Use Authorization (EUA). This test is only authorized for the duration of time the declaration that circumstances exist justifying the authorization of the emergency use of in vitro diagnostic tests for detection of SARS-CoV-2 virus and/or diagnosis of COVID-19 infection under section 564(b)(1) of the Act, 21 U.S.C. 381WEX-9(B) (1), unless the authorization is terminated or revoked sooner. When diagnostic testing is negative, the possibility of a false negative result should be considered in the context of a patient's recent exposures and the presence of clinical signs and symptoms consistent with COVID-19. An individual without symptoms of COVID- 19 and who is not shedding SARS-CoV-2 vi rus would expect to have a negative (not detected) result in this assay. Performed At: [facility] [address] [place], [place] [id number] Rush Farmer MD YB:[id number]    [place]  Final    Comment: Performed at [facility], [place] [address], [place], [place] [address]         Radiology Studies: Dg Chest 2 View  Result Date: 03/11/2019 CLINICAL DATA:  Hypoxemia. Status post biopsy of a left lower lobe pulmonary nodule 03/09/2019. EXAM: CHEST - 2 VIEW COMPARISON:  Single-view of the chest 03/11/2019. PA  and lateral chest 03/10/2019. CT chest and single-view of the chest 01/31/2019. FINDINGS: Patchy opacity in the left lung base is compatible with atelectasis and possibly a small amount of hemorrhage related to biopsy. Lungs are otherwise clear. No pneumothorax. Trace left pleural effusion noted. The chest is hyperexpanded. Heart size is normal. No acute bony abnormality. IMPRESSION: No change in patchy left basilar airspace disease likely atelectasis and/or hemorrhage after biopsy. Negative for pneumothorax after biopsy. Atherosclerosis. Electronically Signed   By: Inge Rise M.D.   On: 03/11/2019 13:05   Dg Chest 2 View  Result Date: 03/10/2019 CLINICAL DATA:  Hemoptysis EXAM: CHEST - 2 VIEW COMPARISON:  None. FINDINGS: The heart size is normal. Vascular calcifications are seen in the aortic arch. An airspace opacity in the left lung base posteriorly likely represents the patient's previously biopsied pulmonary nodule with surrounding post biopsy hemorrhage. The right lung is clear. There is a tiny left pleural effusion. There is no pneumothorax. The visualized skeletal structures are unremarkable. IMPRESSION: 1. Tiny left pleural effusion. No pneumothorax. 2. Left basilar airspace opacity, likely the patient's previously biopsied pulmonary nodule with surrounding post biopsy  hemorrhage. Aortic Atherosclerosis (ICD10-I70.0). Electronically Signed   By: Zerita Boers M.D.   On: 03/10/2019 12:58   Dg Chest Port 1 View  Result Date: 03/11/2019 CLINICAL DATA:  Hypoxemia. EXAM: PORTABLE CHEST 1 VIEW COMPARISON:  One day prior FINDINGS: Osteopenia. Hyperinflation. Midline trachea. Mild cardiomegaly. Atherosclerosis in the transverse aorta. No pleural effusion or pneumothorax. Persistent patchy left base opacity. IMPRESSION: No significant change in hyperinflation with mild left base airspace disease, likely atelectasis and/or hemorrhage after biopsy. Aortic Atherosclerosis (ICD10-I70.0). Electronically  Signed   By: Abigail Miyamoto M.D.   On: 03/11/2019 10:19        Scheduled Meds: . amLODipine  5 mg Oral Daily  . ipratropium-albuterol  3 mL Nebulization Q6H  . losartan  100 mg Oral QPM  . methylPREDNISolone (SOLU-MEDROL) injection  40 mg Intravenous Q8H  . mirtazapine  15 mg Oral QHS  . mometasone-formoterol  2 puff Inhalation BID   Continuous Infusions:         Aline August, MD [facility] 03/12/2019, 8:11 AM

## 2019-03-12 NOTE — Progress Notes (Signed)
Supervising Physician: Arne Cleveland  Patient Status:  [facility] - In-pt  Chief Complaint: Hemoptysis s/p lung biopsy 10/8  Subjective: Patient assessed this afternoon.  Reports dyspnea on exertion but has been able to decrease O2 to 2L [place].  Able to converse more easily today without shortness of breath.  Daugther, [name] at bedside this AM.   Allergies: Fluoxetine  Medications: Prior to Admission medications   Medication Sig Start Date End Date Taking? Authorizing Provider  albuterol (VENTOLIN HFA) 108 (90 Base) MCG/ACT inhaler Inhale 2 puffs into the lungs every 6 (six) hours as needed for wheezing or shortness of breath.  09/02/18  Yes [provider]  amLODipine (NORVASC) 10 MG tablet Take 10 mg by mouth daily. 02/24/19  Yes [provider]  aspirin EC 81 MG tablet Take 81 mg by mouth at bedtime.    Yes [provider]  Calcium Carb-Cholecalciferol (CALCIUM-VITAMIN D) 600-400 MG-UNIT TABS Take 1 tablet by mouth daily.   Yes [provider]  Camphor-Menthol-Methyl Sal (SALONPAS) 3.06-06-08 % PTCH Place 1 patch onto the skin daily as needed (pain).   Yes [provider]  Cholecalciferol (VITAMIN D) 50 MCG (2000 UT) CAPS Take 2,000 Units by mouth daily.   Yes [provider]  DHA-Vitamin C-Lutein (EYE HEALTH FORMULA PO) Take 1 tablet by mouth daily.   Yes [provider]  ibuprofen (ADVIL) 200 MG tablet Take 200-600 mg by mouth daily as needed for headache or mild pain.    Yes [provider]  losartan (COZAAR) 100 MG tablet Take 100 mg by mouth every evening. 01/21/19  Yes [provider]  magnesium gluconate (MAGONATE) 500 MG tablet Take 1,000 mg by mouth daily.    Yes [provider]  mirtazapine (REMERON) 15 MG tablet Take 15 mg by mouth at bedtime. 11/29/18  Yes [provider]  Multiple Vitamin (MULTIVITAMIN) tablet Take 1 tablet by mouth daily.   Yes [provider]   Omega-3-6-9 CAPS Take 1 capsule by mouth 2 (two) times daily.    Yes [provider]  OVER THE COUNTER MEDICATION Take 1 tablet by mouth daily. Life Extension, Brain Support Supplement.   Yes [provider]  Potassium 99 MG TABS Take 99 mg by mouth daily.   Yes [provider]  vitamin B-12 (CYANOCOBALAMIN) 500 MCG tablet Take 500 mcg by mouth daily.   Yes [provider]     Vital Signs: BP 131/71 (BP Location: Left Arm)   Pulse 97   Temp 98.1 F (36.7 C) (Oral)   Resp 18   SpO2 94%   Physical Exam Vitals signs and nursing note reviewed.   NAD, resting comfortably in bed on 2L [place], work of breathing improved. Chest: Lungs clear to auscultation, no audible wheeze.  Puncture site intact.   Imaging: Dg Chest 2 View  Result Date: 03/11/2019 CLINICAL DATA:  Hypoxemia. Status post biopsy of a left lower lobe pulmonary nodule 03/09/2019. EXAM: CHEST - 2 VIEW COMPARISON:  Single-view of the chest 03/11/2019. PA and lateral chest 03/10/2019. CT chest and single-view of the chest 01/31/2019. FINDINGS: Patchy opacity in the left lung base is compatible with atelectasis and possibly a small amount of hemorrhage related to biopsy. Lungs are otherwise clear. No pneumothorax. Trace left pleural effusion noted. The chest is hyperexpanded. Heart size is normal. No acute bony abnormality. IMPRESSION: No change in patchy left basilar airspace disease likely atelectasis and/or hemorrhage after biopsy. Negative for pneumothorax after biopsy. Atherosclerosis.  Electronically Signed   By: Inge Rise M.D.   On: 03/11/2019 13:05   Dg Chest 2 View  Result Date: 03/10/2019 CLINICAL DATA:  Hemoptysis EXAM: CHEST - 2 VIEW COMPARISON:  None. FINDINGS: The heart size is normal. Vascular calcifications are seen in the aortic arch. An airspace opacity in the left lung base posteriorly likely represents the patient's previously biopsied pulmonary nodule with surrounding post  biopsy hemorrhage. The right lung is clear. There is a tiny left pleural effusion. There is no pneumothorax. The visualized skeletal structures are unremarkable. IMPRESSION: 1. Tiny left pleural effusion. No pneumothorax. 2. Left basilar airspace opacity, likely the patient's previously biopsied pulmonary nodule with surrounding post biopsy hemorrhage. Aortic Atherosclerosis (ICD10-I70.0). Electronically Signed   By: Zerita Boers M.D.   On: 03/10/2019 12:58   Dg Chest 2 View  Result Date: 03/10/2019 CLINICAL DATA:  Shortness of breath. Hemoptysis. Recent left lung biopsy. EXAM: CHEST - 2 VIEW COMPARISON:  Multiple chest x-rays dated 03/09/2019 and CT scan dated 01/31/2019 FINDINGS: The heart size and pulmonary vascularity are normal. The haziness at the left lung base has improved consistent with resolving post biopsy hemorrhage. Minimal blunting of the left costophrenic angle consistent with a tiny effusion. Right lung is clear. Both lungs are hyperinflated. Aortic atherosclerosis. No acute bone abnormality. Old compression fracture in the upper thoracic spine. IMPRESSION: 1. No acute abnormalities.  No pneumothorax. 2. Tiny left pleural effusion. 3. Improved haziness at the left lung base consistent with resolving post biopsy hemorrhage. 4. Aortic atherosclerosis. Electronically Signed   By: Lorriane Shire M.D.   On: 03/10/2019 10:47   Ct Biopsy  Result Date: 03/09/2019 CLINICAL DATA:  Hypermetabolic left lower lobe lung nodule EXAM: CT GUIDED CORE BIOPSY OF LEFT LOWER LOBE LUNG NODULE ANESTHESIA/SEDATION: Intravenous Fentanyl 21mcg and Versed 1mg  were administered as conscious sedation during continuous monitoring of the patient's level of consciousness and physiological / cardiorespiratory status by the radiology RN, with a total moderate sedation time of 14 minutes. PROCEDURE: The procedure risks, benefits, and alternatives were explained to the patient. Questions regarding the procedure were  encouraged and answered. The patient understands and consents to the procedure. Patient placed supine. Select axial scans through the thorax were obtained. The left lower lobe lesion was localized. It was unchanged in size from previous PET-CT. An appropriate skin entry site was determined and marked. The operative field was prepped with chlorhexidinein a sterile fashion, and a sterile drape was applied covering the operative field. A sterile gown and sterile gloves were used for the procedure. Local anesthesia was provided with 1% Lidocaine. Under CT fluoroscopic guidance, a 17 gauge trocar needle was advanced to the margin of the lesion. Once needle tip position was confirmed, coaxial 18-gauge core biopsy samples were obtained, submitted in formalin to surgical pathology. The guide needle was removed. Postprocedure scans show no pneumothorax. Small amount of peripheral alveolar hemorrhage. COMPLICATIONS: Self-limited hemoptysis and mild hypoxia which responded to supplemental O2. SIR level B: Nominal therapy (including overnight admission for observation), no consequence. FINDINGS: Left lower lobe nodule was similar in appearance to prior PET-CT. Representative core biopsy samples obtained as above. Patient had mild self limited hemoptysis postprocedure with mild hypoxia which responded to supplemental O2. She remained otherwise stable and will be kept for overnight observation , and follow-up chest radiography. IMPRESSION: 1. Technically successful CT-guided core biopsy, left lower lobe lung nodule. 2. Extended observation secondary to mild self-limited hemoptysis and hypoxia which responded to supplemental O2. Results were  called by telephone at the time of interpretation on 03/09/2019 at 3:02 pm to providerHARRELL LIGHTFOOT , who verbally acknowledged these results. Electronically Signed   By: Lucrezia Europe M.D.   On: 03/09/2019 15:04   Dg Chest Port 1 View  Result Date: 03/11/2019 CLINICAL DATA:  Hypoxemia.  EXAM: PORTABLE CHEST 1 VIEW COMPARISON:  One day prior FINDINGS: Osteopenia. Hyperinflation. Midline trachea. Mild cardiomegaly. Atherosclerosis in the transverse aorta. No pleural effusion or pneumothorax. Persistent patchy left base opacity. IMPRESSION: No significant change in hyperinflation with mild left base airspace disease, likely atelectasis and/or hemorrhage after biopsy. Aortic Atherosclerosis (ICD10-I70.0). Electronically Signed   By: Abigail Miyamoto M.D.   On: 03/11/2019 10:19   Dg Chest Port 1 View  Result Date: 03/09/2019 CLINICAL DATA:  Hemoptysis after lung biopsy. EXAM: PORTABLE CHEST 1 VIEW COMPARISON:  Chest x-rays and CT biopsy from same day. FINDINGS: The heart size and mediastinal contours are within normal limits. Atherosclerotic calcification of the aortic arch. Normal pulmonary vascularity. Unchanged patchy airspace disease at the left lung base. No pneumothorax or pleural effusion. No acute osseous abnormality. IMPRESSION: 1. No pneumothorax. The suspected pleural line on the prior x-ray represented a skin fold as it extended beyond the margins of the lung. 2. Unchanged post biopsy hemorrhage in the left lower lobe. Electronically Signed   By: Titus Dubin M.D.   On: 03/09/2019 20:06   Dg Chest Port 1 View  Result Date: 03/09/2019 CLINICAL DATA:  Hemoptysis post left lung needle biopsy EXAM: PORTABLE CHEST 1 VIEW COMPARISON:  03/09/2019 FINDINGS: Progression of left lower lobe airspace disease most likely post biopsy hemorrhage. Interval development of left pneumothorax approximately 2.5 mm. Minimal left effusion Right lung clear.  Atherosclerotic aortic arch. IMPRESSION: Progression of left lower lobe airspace disease compatible with hemorrhage post biopsy. Interval development of moderate left pneumothorax. These results will be called to the ordering clinician or representative by the Radiologist Assistant, and communication documented in the PACS or zVision Dashboard.  Electronically Signed   By: Franchot Gallo M.D.   On: 03/09/2019 15:25   Dg Chest Port 1 View  Result Date: 03/09/2019 CLINICAL DATA:  Hemoptysis.  Lung biopsy. EXAM: PORTABLE CHEST 1 VIEW COMPARISON:  January 31, 2019. FINDINGS: Stable cardiomediastinal silhouette. Atherosclerosis of thoracic aorta is noted. Stable right basilar scarring is noted. Slightly increased left basilar opacity is noted which may represent pulmonary hemorrhage secondary to biopsy. Bony thorax is unremarkable. No pneumothorax or significant effusion is noted. IMPRESSION: No pneumothorax status post left lung biopsy. Slightly increased left basilar density is noted which may represent pulmonary hemorrhage secondary to biopsy. Aortic Atherosclerosis (ICD10-I70.0). Electronically Signed   By: Marijo Conception M.D.   On: 03/09/2019 12:54    Labs:  CBC: Recent Labs    01/31/19 1221 03/09/19 0929 03/10/19 1059 03/11/19 0158 03/12/19 0257  WBC 5.7 5.6  --  7.3 5.7  HGB 14.1 15.2* 11.5* 11.1* 12.7  HCT 43.9 46.6* 35.1* 33.9* 39.0  PLT 300 336  --  241 278    COAGS: Recent Labs    03/09/19 0929  INR 0.9    BMP: Recent Labs    01/31/19 1221 03/11/19 0158 03/12/19 0257  NA 138 141 138  K 3.9 3.8 3.2*  CL 103 110 101  CO2 25 25 23   GLUCOSE 98 98 187*  BUN 19 14 19   CALCIUM 9.4 9.0 9.5  CREATININE 0.81 0.67 0.77  GFRNONAA >60 >60 >60  GFRAA >60 >60 >60  LIVER FUNCTION TESTS: Recent Labs    01/31/19 1221  BILITOT 0.9  AST 21  ALT 20  ALKPHOS 73  PROT 7.2  ALBUMIN 4.1    Assessment and Plan: Hemoptysis s/p lung biopsy 10/9, resolved Hypoxia  Patient with ongoing hypoxia.  She has been unable to wean to 2L [place] and is subjectively better today.  Hemoglobin improved to 12.7 today.   IR will continue to follow while inpatient.   Spoke with daughter at length at bedside.  They are understandably concerned about patient's worsened shortness of breath from baseline due to advanced age and  patient lives at home alone. Continuing to work with RN and therapies for home needs.   Electronically Signed: Docia Barrier, PA 03/12/2019, 3:21 PM   I spent a total of 15 Minutes at the the patient's bedside AND on the patient's hospital floor or unit, greater than 50% of which was counseling/coordinating care for hemoptysis, hypoxia.

## 2019-03-12 NOTE — Progress Notes (Signed)
Pt report has intermittently coughed up pearl sized sticky blood (perhaps mucous plug?) beginning at approx 0430. Provided patient with basin for RN to assess if coughed up additional mucous plug/material.  Patient reported coughing up quarter size of same bloody sticky material prior day.   Received call from patient's son Mckenleigh Tarlton at approx 0700, inquiring if mother slept overnight. Informed son mother slept ok, only given PRN Tylenol at bedtime, no complaints of pain and O2 remained in 90's.

## 2019-03-13 DIAGNOSIS — R0602 Shortness of breath: Secondary | ICD-10-CM

## 2019-03-13 DIAGNOSIS — R05 Cough: Secondary | ICD-10-CM

## 2019-03-13 LAB — CBC WITH DIFFERENTIAL/PLATELET
Abs Immature Granulocytes: 0.05 10*3/uL (ref 0.00–0.07)
Basophils Absolute: 0 10*3/uL (ref 0.0–0.1)
Basophils Relative: 0 %
Eosinophils Absolute: 0.1 10*3/uL (ref 0.0–0.5)
Eosinophils Relative: 1 %
HCT: 35.1 % — ABNORMAL LOW (ref 36.0–46.0)
Hemoglobin: 11.5 g/dL — ABNORMAL LOW (ref 12.0–15.0)
Immature Granulocytes: 0 %
Lymphocytes Relative: 13 %
Lymphs Abs: 1.7 10*3/uL (ref 0.7–4.0)
MCH: 29.3 pg (ref 26.0–34.0)
MCHC: 32.8 g/dL (ref 30.0–36.0)
MCV: 89.3 fL (ref 80.0–100.0)
Monocytes Absolute: 0.9 10*3/uL (ref 0.1–1.0)
Monocytes Relative: 7 %
Neutro Abs: 9.7 10*3/uL — ABNORMAL HIGH (ref 1.7–7.7)
Neutrophils Relative %: 79 %
Platelets: 271 10*3/uL (ref 150–400)
RBC: 3.93 MIL/uL (ref 3.87–5.11)
RDW: 13.9 % (ref 11.5–15.5)
WBC: 12.5 10*3/uL — ABNORMAL HIGH (ref 4.0–10.5)
nRBC: 0 % (ref 0.0–0.2)

## 2019-03-13 LAB — BASIC METABOLIC PANEL
Anion gap: 6 (ref 5–15)
BUN: 23 mg/dL (ref 8–23)
CO2: 25 mmol/L (ref 22–32)
Calcium: 9.3 mg/dL (ref 8.9–10.3)
Chloride: 107 mmol/L (ref 98–111)
Creatinine, Ser: 0.74 mg/dL (ref 0.44–1.00)
GFR calc Af Amer: >60 mL/min (ref >60)
GFR calc non Af Amer: >60 mL/min (ref >60)
Glucose, Bld: 110 mg/dL — ABNORMAL HIGH (ref 70–99)
Potassium: 4.8 mmol/L (ref 3.5–5.1)
Sodium: 138 mmol/L (ref 135–145)

## 2019-03-13 LAB — MAGNESIUM: Magnesium: 2.2 mg/dL (ref 1.7–2.4)

## 2019-03-13 MED ORDER — AZITHROMYCIN 500 MG PO TABS
500.0000 mg | ORAL_TABLET | Freq: Once | ORAL | Status: AC
  Administered 2019-03-13: 500 mg via ORAL
  Filled 2019-03-13: qty 1

## 2019-03-13 MED ORDER — AZITHROMYCIN 500 MG PO TABS
250.0000 mg | ORAL_TABLET | Freq: Every day | ORAL | Status: DC
  Administered 2019-03-14 – 2019-03-15 (×2): 250 mg via ORAL
  Filled 2019-03-13 (×2): qty 1

## 2019-03-13 MED ORDER — LEVALBUTEROL HCL 0.63 MG/3ML IN NEBU
0.6300 mg | INHALATION_SOLUTION | Freq: Four times a day (QID) | RESPIRATORY_TRACT | Status: DC | PRN
  Administered 2019-03-14: 0.63 mg via RESPIRATORY_TRACT
  Filled 2019-03-13: qty 3

## 2019-03-13 NOTE — Progress Notes (Signed)
Referring Physician(s): Hassell,Daniel  Supervising Physician: Markus Daft  Patient Status:  [facility] - In-pt  Chief Complaint: "Hemoptysis" and "shortness of breath"  Subjective:  Patient with history of LLL mass s/p percutaneous biopsy in IR 03/09/2019 with subsequent development of hemoptysis and dyspnea requiring supplemental oxygen. Patient awake and alert sitting in bed eating lunch. Accompanied by daughter, [name], at bedside. States dyspnea has improved, however still has dyspnea with exertion. States that she had two episodes of hemoptysis today, described as "dark red clots". On RA.   Allergies: Fluoxetine  Medications: Prior to Admission medications   Medication Sig Start Date End Date Taking? Authorizing Provider  albuterol (VENTOLIN HFA) 108 (90 Base) MCG/ACT inhaler Inhale 2 puffs into the lungs every 6 (six) hours as needed for wheezing or shortness of breath.  09/02/18  Yes [provider]  amLODipine (NORVASC) 10 MG tablet Take 10 mg by mouth daily. 02/24/19  Yes [provider]  aspirin EC 81 MG tablet Take 81 mg by mouth at bedtime.    Yes [provider]  Calcium Carb-Cholecalciferol (CALCIUM-VITAMIN D) 600-400 MG-UNIT TABS Take 1 tablet by mouth daily.   Yes [provider]  Camphor-Menthol-Methyl Sal (SALONPAS) 3.06-06-08 % PTCH Place 1 patch onto the skin daily as needed (pain).   Yes [provider]  Cholecalciferol (VITAMIN D) 50 MCG (2000 UT) CAPS Take 2,000 Units by mouth daily.   Yes [provider]  DHA-Vitamin C-Lutein (EYE HEALTH FORMULA PO) Take 1 tablet by mouth daily.   Yes [provider]  ibuprofen (ADVIL) 200 MG tablet Take 200-600 mg by mouth daily as needed for headache or mild pain.    Yes [provider]  losartan (COZAAR) 100 MG tablet Take 100 mg by mouth every evening. 01/21/19  Yes [provider]  magnesium gluconate (MAGONATE) 500 MG tablet Take 1,000 mg by mouth  daily.    Yes [provider]  mirtazapine (REMERON) 15 MG tablet Take 15 mg by mouth at bedtime. 11/29/18  Yes [provider]  Multiple Vitamin (MULTIVITAMIN) tablet Take 1 tablet by mouth daily.   Yes [provider]  Omega-3-6-9 CAPS Take 1 capsule by mouth 2 (two) times daily.    Yes [provider]  OVER THE COUNTER MEDICATION Take 1 tablet by mouth daily. Life Extension, Brain Support Supplement.   Yes [provider]  Potassium 99 MG TABS Take 99 mg by mouth daily.   Yes [provider]  vitamin B-12 (CYANOCOBALAMIN) 500 MCG tablet Take 500 mcg by mouth daily.   Yes [provider]     Vital Signs: BP 131/71 (BP Location: Left Arm)    Pulse 97    Temp 98.1 F (36.7 C) (Oral)    Resp 18    Wt 107 lb 8 oz (48.8 kg)    SpO2 90%    BMI 20.99 kg/m   Physical Exam Vitals signs and nursing note reviewed.  Constitutional:      General: She is not in acute distress.    Appearance: Normal appearance.  Pulmonary:     Effort: Pulmonary effort is normal. No respiratory distress.     Breath sounds: Normal breath sounds. No wheezing.  Skin:    General: Skin is warm and dry.  Neurological:     Mental Status: She is alert and oriented to person, place, and time.     Imaging: Dg Chest 2 View  Result Date: 03/11/2019 CLINICAL DATA:  Hypoxemia. Status  post biopsy of a left lower lobe pulmonary nodule 03/09/2019. EXAM: CHEST - 2 VIEW COMPARISON:  Single-view of the chest 03/11/2019. PA and lateral chest 03/10/2019. CT chest and single-view of the chest 01/31/2019. FINDINGS: Patchy opacity in the left lung base is compatible with atelectasis and possibly a small amount of hemorrhage related to biopsy. Lungs are otherwise clear. No pneumothorax. Trace left pleural effusion noted. The chest is hyperexpanded. Heart size is normal. No acute bony abnormality. IMPRESSION: No change in patchy left basilar airspace disease likely atelectasis  and/or hemorrhage after biopsy. Negative for pneumothorax after biopsy. Atherosclerosis. Electronically Signed   By: Inge Rise M.D.   On: 03/11/2019 13:05   Dg Chest 2 View  Result Date: 03/10/2019 CLINICAL DATA:  Hemoptysis EXAM: CHEST - 2 VIEW COMPARISON:  None. FINDINGS: The heart size is normal. Vascular calcifications are seen in the aortic arch. An airspace opacity in the left lung base posteriorly likely represents the patient's previously biopsied pulmonary nodule with surrounding post biopsy hemorrhage. The right lung is clear. There is a tiny left pleural effusion. There is no pneumothorax. The visualized skeletal structures are unremarkable. IMPRESSION: 1. Tiny left pleural effusion. No pneumothorax. 2. Left basilar airspace opacity, likely the patient's previously biopsied pulmonary nodule with surrounding post biopsy hemorrhage. Aortic Atherosclerosis (ICD10-I70.0). Electronically Signed   By: Zerita Boers M.D.   On: 03/10/2019 12:58   Dg Chest 2 View  Result Date: 03/10/2019 CLINICAL DATA:  Shortness of breath. Hemoptysis. Recent left lung biopsy. EXAM: CHEST - 2 VIEW COMPARISON:  Multiple chest x-rays dated 03/09/2019 and CT scan dated 01/31/2019 FINDINGS: The heart size and pulmonary vascularity are normal. The haziness at the left lung base has improved consistent with resolving post biopsy hemorrhage. Minimal blunting of the left costophrenic angle consistent with a tiny effusion. Right lung is clear. Both lungs are hyperinflated. Aortic atherosclerosis. No acute bone abnormality. Old compression fracture in the upper thoracic spine. IMPRESSION: 1. No acute abnormalities.  No pneumothorax. 2. Tiny left pleural effusion. 3. Improved haziness at the left lung base consistent with resolving post biopsy hemorrhage. 4. Aortic atherosclerosis. Electronically Signed   By: Lorriane Shire M.D.   On: 03/10/2019 10:47   Dg Chest Port 1 View  Result Date: 03/11/2019 CLINICAL DATA:   Hypoxemia. EXAM: PORTABLE CHEST 1 VIEW COMPARISON:  One day prior FINDINGS: Osteopenia. Hyperinflation. Midline trachea. Mild cardiomegaly. Atherosclerosis in the transverse aorta. No pleural effusion or pneumothorax. Persistent patchy left base opacity. IMPRESSION: No significant change in hyperinflation with mild left base airspace disease, likely atelectasis and/or hemorrhage after biopsy. Aortic Atherosclerosis (ICD10-I70.0). Electronically Signed   By: Abigail Miyamoto M.D.   On: 03/11/2019 10:19   Dg Chest Port 1 View  Result Date: 03/09/2019 CLINICAL DATA:  Hemoptysis after lung biopsy. EXAM: PORTABLE CHEST 1 VIEW COMPARISON:  Chest x-rays and CT biopsy from same day. FINDINGS: The heart size and mediastinal contours are within normal limits. Atherosclerotic calcification of the aortic arch. Normal pulmonary vascularity. Unchanged patchy airspace disease at the left lung base. No pneumothorax or pleural effusion. No acute osseous abnormality. IMPRESSION: 1. No pneumothorax. The suspected pleural line on the prior x-ray represented a skin fold as it extended beyond the margins of the lung. 2. Unchanged post biopsy hemorrhage in the left lower lobe. Electronically Signed   By: Titus Dubin M.D.   On: 03/09/2019 20:06    Labs:  CBC: Recent Labs    03/09/19 0929 03/10/19 1059 03/11/19 0158 03/12/19 0257  03/13/19 0305  WBC 5.6  --  7.3 5.7 12.5*  HGB 15.2* 11.5* 11.1* 12.7 11.5*  HCT 46.6* 35.1* 33.9* 39.0 35.1*  PLT 336  --  241 278 271    COAGS: Recent Labs    03/09/19 0929  INR 0.9    BMP: Recent Labs    01/31/19 1221 03/11/19 0158 03/12/19 0257 03/13/19 0305  NA 138 141 138 138  K 3.9 3.8 3.2* 4.8  CL 103 110 101 107  CO2 25 25 23 25   GLUCOSE 98 98 187* 110*  BUN 19 14 19 23   CALCIUM 9.4 9.0 9.5 9.3  CREATININE 0.81 0.67 0.77 0.74  GFRNONAA >60 >60 >60 >60  GFRAA >60 >60 >60 >60    LIVER FUNCTION TESTS: Recent Labs    01/31/19 1221  BILITOT 0.9  AST 21  ALT  20  ALKPHOS 73  PROT 7.2  ALBUMIN 4.1    Assessment and Plan:  Patient with history of LLL mass s/p percutaneous biopsy in IR 03/09/2019 with subsequent development of hemoptysis and dyspnea requiring supplemental oxygen. Patient with ongoing hypoxia that has improved, currently on RA. Hgb down to 11.5 (from 12.7 yesterday). Discussed case with Dr. Anselm Pancoast who recommends transfer to [facility] service for further management- stable from IR standpoint. Spoke with Dr. Starla Link who agreed to accept patient. Further plans per TRH/CCM/TCTS- appreciate and agree with management. IR to follow while patient in house.   Electronically Signed: Earley Abide, PA-C 03/13/2019, 3:19 PM   I spent a total of 35 Minutes at the the patient's bedside AND on the patient's hospital floor or unit, greater than 50% of which was counseling/coordinating care for hemoptysis s/p lung nodule biopsy.

## 2019-03-13 NOTE — TOC Initial Note (Signed)
Transition of Care [facility]) - Initial/Assessment Note    Patient Details  Name: Mychele Seyller MRN: [id number] Date of Birth: Jun 19, 1929  [facility]) CM/SW Contact:    Benard Halsted, LCSW Phone Number: 03/13/2019, 3:18 PM  Clinical Narrative:                 CSW received consult for possible SNF placement at time of discharge. CSW spoke with patient and her daughter regarding PT recommendation of SNF placement at time of discharge. Patient reported that patient's son may be unable to currently care for patient at their home given patient's current physical needs. Patient expressed understanding of PT recommendation and is agreeable to SNF placement at time of discharge if she is not feeling better over the next few days. CSW discussed insurance authorization process and provided Medicare SNF and home health ratings list in the event that she decides on home health. Patient's daughter had questions regarding home oxygen and requests a 3in1 bedside commode. She has a walker at home. Patient expressed being hopeful for rehab and to feel better soon. No further questions reported at this time. CSW to continue to follow and assist with discharge planning needs.   Expected Discharge Plan: Skilled Nursing Facility Barriers to Discharge: Continued Medical Work up, Ship broker   Patient Goals and CMS Choice Patient states their goals for this hospitalization and ongoing recovery are:: Get stronger to return home CMS Medicare.gov Compare Post Acute Care list provided to:: Patient Choice offered to / list presented to : Patient, Adult Children  Expected Discharge Plan and Services Expected Discharge Plan: [place] In-house Referral: Clinical Social Work Discharge Planning Services: NA Post Acute Care Choice: [place] Living arrangements for the past 2 months: Single Family Home                 DME Arranged: N/A DME Agency: NA       HH  Arranged: NA HH Agency: NA        Prior Living Arrangements/Services Living arrangements for the past 2 months: [place] Lives with:: Self Patient language and need for interpreter reviewed:: Yes Do you feel safe going back to the place where you live?: Yes      Need for Family Participation in Patient Care: Yes (Comment) Care giver support system in place?: Yes (comment) Current home services: DME Criminal Activity/Legal Involvement Pertinent to Current Situation/Hospitalization: No - Comment as needed  Activities of Daily Living      Permission Sought/Granted Permission sought to share information with : Facility Sport and exercise psychologist, Family Supports Permission granted to share information with : Yes, Verbal Permission Granted  Share Information with NAME: Beverlee Nims  Permission granted to share info w AGENCY: SNFs  Permission granted to share info w Relationship: Daughter  Permission granted to share info w Contact Information: [phone]  Emotional Assessment Appearance:: Appears stated age Attitude/Demeanor/Rapport: Gracious, Engaged Affect (typically observed): Accepting, Pleasant, Appropriate Orientation: : Oriented to Self, Oriented to Place, Oriented to  Time, Oriented to Situation Alcohol / Substance Use: Not Applicable Psych Involvement: No (comment)  Admission diagnosis:  Hemoptysis [R04.2] Patient Active Problem List   Diagnosis Date Noted  . Dyspnea 03/10/2019  . Hemoptysis 03/09/2019  . Lung mass 02/03/2019  . Post-menopausal osteoporosis 11/24/2018  . Hypertension, essential 02/10/2016   PCP:  [name], MD Pharmacy:   [place], [address] AT [address]  [place] [address] Phone: [phone] Fax: [phone]     Social Determinants of Health (SDOH) Interventions    Readmission Risk Interventions No flowsheet data found.

## 2019-03-13 NOTE — Progress Notes (Signed)
     [address]       [place] [address]             [phone]       I had a long discussion today with Mrs. Stcharles, her daughter and her son in regards the results of the CT-guided biopsy.  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  I expressed my concern for her ability to recover surgical operation, given how she spoke to the CT-guided biopsy.  She does not appear to be very frail and continues to require supplemental oxygen.  Furthermore we discussed other options for treatment which included SBRT and chemotherapy.  Her case will be presented this week tumor board and we will arrange follow-up in our multidisciplinary clinic as well.  Angelica Frandsen Bary Leriche

## 2019-03-13 NOTE — Progress Notes (Addendum)
Patient ID: Sandra Cooper, female   DOB: Aug 31, 1929, 85 y.o.   MRN: [id number]  PROGRESS NOTE    Sandra Cooper  JQB:[id number] DOB: July 23, 1929 DOA: 03/09/2019 PCP: [name], MD   Brief Narrative:  85 year old female with history of asthma, hypertension and lung nodule status post lung biopsy by IR on 03/09/2019 and subsequently developed hemoptysis and increased oxygen requirement and had to be subsequently admitted under our service.  Hospitalist service was consulted for hypoxemia.  Assessment & Plan:   Acute hypoxic respiratory failure after IR guided lung biopsy on 03/09/2019 for a lung nodule Question of asthma exacerbation -Still requiring 2 L oxygen.  IR following.   -Continue nebs and Dulera.  Solu-Medrol has been switched to oral prednisone 40 mg daily. -If hypoxia worsens, consider CT of the chest and/or pulmonary evaluation. -Echo showed EF of 65 to 70% with impaired relaxing pattern of LV diastolic filling. -Might need supplemental oxygen on discharge.  Acute blood loss anemia Hemoptysis -Most likely from the lung biopsy.  Still having intermittent cough with hemoptysis/blood clots.  Will request pulmonary evaluation.  Hemoglobin 11.5 this morning.  Hypertension  -Monitor blood pressure.  Continue amlodipine and losartan.  Generalized deconditioning -Patient might need SNF placement.  We will follow-up with PT again.  Spoke to [employer] on phone on 03/13/2019  Subjective: Patient seen and examined at bedside.  No overnight fever or vomiting reported.  Still feels short of breath with exertion but improving.  She feels very weak.  Objective: Vitals:   03/11/19 1439 03/11/19 2138 03/12/19 1600 03/13/19 0429  BP:  131/71    Pulse:  97    Resp:  18 18   Temp:  98.1 F (36.7 C)    TempSrc:  Oral    SpO2: 94% 94% 93%   Weight:    48.8 kg    Intake/Output Summary (Last 24 hours) at 03/13/2019 0754 Last data filed at 03/13/2019 0430 Gross per 24 hour   Intake 240 ml  Output 2000 ml  Net -1760 ml   Filed Weights   03/13/19 0429  Weight: 48.8 kg    Examination:  General exam: No acute distress.  Elderly female lying in bed. Respiratory system: Bilateral decreased breath sounds at bases with some scattered crackles.  No wheezing  cardiovascular system: S1-S2 heard, rate controlled Gastrointestinal system: Abdomen is nondistended, soft and nontender. Normal bowel sounds heard. Extremities: No cyanosis, edema  Data Reviewed: I have personally reviewed following labs and imaging studies  CBC: Recent Labs  Lab 03/09/19 0929 03/10/19 1059 03/11/19 0158 03/12/19 0257 03/13/19 0305  WBC 5.6  --  7.3 5.7 12.5*  NEUTROABS  --   --  4.6 5.1 9.7*  HGB 15.2* 11.5* 11.1* 12.7 11.5*  HCT 46.6* 35.1* 33.9* 39.0 35.1*  MCV 89.3  --  89.0 88.6 89.3  PLT 336  --  241 278 409   Basic Metabolic Panel: Recent Labs  Lab 03/11/19 0158 03/12/19 0257 03/13/19 0305  NA 141 138 138  K 3.8 3.2* 4.8  CL 110 101 107  CO2 25 23 25   GLUCOSE 98 187* 110*  BUN 14 19 23   CREATININE 0.67 0.77 0.74  CALCIUM 9.0 9.5 9.3  MG  --  1.9 2.2   GFR: Estimated Creatinine Clearance: 35.6 mL/min (by C-G formula based on SCr of 0.74 mg/dL). Liver Function Tests: No results for input(s): AST, ALT, ALKPHOS, BILITOT, PROT, ALBUMIN in the last 168 hours. No results for input(s): LIPASE, AMYLASE  in the last 168 hours. No results for input(s): AMMONIA in the last 168 hours. Coagulation Profile: Recent Labs  Lab 03/09/19 0929  INR 0.9   Cardiac Enzymes: No results for input(s): CKTOTAL, CKMB, CKMBINDEX, TROPONINI in the last 168 hours. BNP (last 3 results) No results for input(s): PROBNP in the last 8760 hours. HbA1C: No results for input(s): HGBA1C in the last 72 hours. CBG: No results for input(s): GLUCAP in the last 168 hours. Lipid Profile: No results for input(s): CHOL, HDL, LDLCALC, TRIG, CHOLHDL, LDLDIRECT in the last 72 hours. Thyroid  Function Tests: No results for input(s): TSH, T4TOTAL, FREET4, T3FREE, THYROIDAB in the last 72 hours. Anemia Panel: No results for input(s): VITAMINB12, FOLATE, FERRITIN, TIBC, IRON, RETICCTPCT in the last 72 hours. Sepsis Labs: No results for input(s): PROCALCITON, LATICACIDVEN in the last 168 hours.  Recent Results (from the past 240 hour(s))  Novel Coronavirus, NAA (Hosp order, Send-out to Ref Lab; TAT 18-24 hrs     Status: None   Collection Time: 03/06/19  9:54 AM   Specimen: Nasopharyngeal Swab; Respiratory  Result Value Ref Range Status   SARS-CoV-2, NAA NOT DETECTED NOT DETECTED Final    Comment: (NOTE) This nucleic acid amplification test was developed and its performance characteristics determined by [employer]. Nucleic acid amplification tests include PCR and TMA. This test has not been FDA cleared or approved. This test has been authorized by FDA under an Emergency Use Authorization (EUA). This test is only authorized for the duration of time the declaration that circumstances exist justifying the authorization of the emergency use of in vitro diagnostic tests for detection of SARS-CoV-2 virus and/or diagnosis of COVID-19 infection under section 564(b)(1) of the Act, 21 U.S.C. 235TDD-2(K) (1), unless the authorization is terminated or revoked sooner. When diagnostic testing is negative, the possibility of a false negative result should be considered in the context of a patient's recent exposures and the presence of clinical signs and symptoms consistent with COVID-19. An individual without symptoms of COVID- 19 and who is not shedding SARS-CoV-2 vi rus would expect to have a negative (not detected) result in this assay. Performed At: [facility] [address] [place], [place] [id number] Rush Farmer MD BJ:[id number]    [place]  Final    Comment: Performed at [facility], [place] [address] [place],  [address]          Radiology Studies: Dg Chest 2 View  Result Date: 03/11/2019 CLINICAL DATA:  Hypoxemia. Status post biopsy of a left lower lobe pulmonary nodule 03/09/2019. EXAM: CHEST - 2 VIEW COMPARISON:  Single-view of the chest 03/11/2019. PA and lateral chest 03/10/2019. CT chest and single-view of the chest 01/31/2019. FINDINGS: Patchy opacity in the left lung base is compatible with atelectasis and possibly a small amount of hemorrhage related to biopsy. Lungs are otherwise clear. No pneumothorax. Trace left pleural effusion noted. The chest is hyperexpanded. Heart size is normal. No acute bony abnormality. IMPRESSION: No change in patchy left basilar airspace disease likely atelectasis and/or hemorrhage after biopsy. Negative for pneumothorax after biopsy. Atherosclerosis. Electronically Signed   By: Inge Rise M.D.   On: 03/11/2019 13:05        Scheduled Meds: . amLODipine  5 mg Oral Daily  . ipratropium-albuterol  3 mL Nebulization Q6H  . losartan  100 mg Oral QPM  . mirtazapine  15 mg Oral QHS  . mometasone-formoterol  2 puff Inhalation BID  . predniSONE  40 mg Oral  Q breakfast   Continuous Infusions:         Aline August, MD [facility] 03/13/2019, 7:54 AM

## 2019-03-13 NOTE — Progress Notes (Signed)
Patient continues to refuse duoneb breathing treatment.  She states that the make her "jittery".  I have placed an order for Xoponex .63mg  q6prn if she decides to take a nebulizer.

## 2019-03-13 NOTE — NC FL2 (Signed)
[place] LEVEL OF CARE SCREENING TOOL     IDENTIFICATION  Patient Name: Sandra Cooper Birthdate: 16-Dec-1929 Sex: female Admission Date (Current Location): 03/09/2019  [facility] and [place] Number:  Herbalist and Address:  [facility], [place] [address] [place], [place] [address]      Provider Number: [id number]  Attending Physician Name and Address:  Arne Cleveland, MD  Relative Name and Phone Number:  Particia Jasper, [phone]    Current Level of Care: Hospital Recommended Level of Care: [place] Prior Approval Number:    Date Approved/Denied:   PASRR Number: [id number]  Discharge Plan: SNF    Current Diagnoses: Patient Active Problem List   Diagnosis Date Noted  . Dyspnea 03/10/2019  . Hemoptysis 03/09/2019  . Lung mass 02/03/2019  . Post-menopausal osteoporosis 11/24/2018  . Hypertension, essential 02/10/2016    Orientation RESPIRATION BLADDER Height & Weight     Self, Situation, Time, Place  Normal, O2(1L Nasal cannula) Continent Weight: 107 lb 8 oz (48.8 kg) Height:     BEHAVIORAL SYMPTOMS/MOOD NEUROLOGICAL BOWEL NUTRITION STATUS      Continent Diet(Please see DC Summary)  AMBULATORY STATUS COMMUNICATION OF NEEDS Skin   Limited Assist Verbally Surgical wounds(Closed incision on chest)                       Personal Care Assistance Level of Assistance  Bathing, Dressing, Feeding Bathing Assistance: Limited assistance Feeding assistance: Independent Dressing Assistance: Limited assistance     Functional Limitations Info  Sight Sight Info: Impaired        SPECIAL CARE FACTORS FREQUENCY  PT (By licensed PT), OT (By licensed OT)     PT Frequency: 5x OT Frequency: 3x            Contractures Contractures Info: Not present    Additional Factors Info  Code Status, Allergies Code Status Info: Full Allergies Info: Fluoxetine           Current Medications (03/13/2019):   This is the current hospital active medication list Current Facility-Administered Medications  Medication Dose Route Frequency Provider Last Rate Last Dose  . acetaminophen (TYLENOL) suppository 650 mg  650 mg Rectal Q6H PRN Arne Cleveland, MD      . acetaminophen (TYLENOL) tablet 650 mg  650 mg Oral Q6H PRN Arne Cleveland, MD   650 mg at 03/12/19 1039  . albuterol (PROVENTIL) (2.5 MG/3ML) 0.083% nebulizer solution 2.5 mg  2.5 mg Nebulization Q2H PRN Alekh, Kshitiz, MD      . amLODipine (NORVASC) tablet 5 mg  5 mg Oral Daily Ascencion Dike, PA-C   5 mg at 03/13/19 3329  . [START ON 03/14/2019] azithromycin (ZITHROMAX) tablet 250 mg  250 mg Oral Daily Chesley Mires, MD      . bisacodyl (DULCOLAX) EC tablet 5 mg  5 mg Oral Daily PRN Arne Cleveland, MD      . HYDROcodone-acetaminophen (NORCO/VICODIN) 5-325 MG per tablet 1-2 tablet  1-2 tablet Oral Q4H PRN Ascencion Dike, PA-C      . levalbuterol Penne Lash) nebulizer solution 0.63 mg  0.63 mg Nebulization Q6H PRN Arne Cleveland, MD      . losartan (COZAAR) tablet 100 mg  100 mg Oral QPM Ascencion Dike, PA-C   100 mg at 03/12/19 1806  . magnesium citrate solution 1 Bottle  1 Bottle Oral Once PRN Arne Cleveland, MD      . mirtazapine (REMERON) tablet 15 mg  15  mg Oral QHS Ascencion Dike, PA-C   15 mg at 03/12/19 2056  . mometasone-formoterol (DULERA) 200-5 MCG/ACT inhaler 2 puff  2 puff Inhalation BID Merton Border, MD   2 puff at 03/12/19 2054  . ondansetron (ZOFRAN) injection 4 mg  4 mg Intravenous Q6H PRN Arne Cleveland, MD      . ondansetron [facility]) tablet 4 mg  4 mg Oral Q6H PRN Arne Cleveland, MD      . predniSONE (DELTASONE) tablet 40 mg  40 mg Oral Q breakfast Starla Link, Kshitiz, MD   40 mg at 03/13/19 0800  . senna-docusate (Senokot-S) tablet 1 tablet  1 tablet Oral QHS PRN Arne Cleveland, MD      . zolpidem (AMBIEN) tablet 5 mg  5 mg Oral QHS PRN Arne Cleveland, MD         Discharge Medications: Please see discharge summary for a list  of discharge medications.  Relevant Imaging Results:  Relevant Lab Results:   Additional Information SSN: [place] negative 10/5  Benard Halsted, LCSW

## 2019-03-13 NOTE — Progress Notes (Signed)
03/13/2019 SATURATION QUALIFICATIONS: (This note is used to comply with regulatory documentation for home oxygen)  Patient Saturations on Room Air at Rest = 90%  Patient Saturations on Room Air while Ambulating = 93%  Patient Saturations on NT Liters of oxygen while Ambulating = NT  Please briefly explain why patient needs home oxygen: Pt's O2 saturations did better while she was up and moving around than in bed at rest.  She did not drop below 90% on RA at rest or with mobility.   Barbarann Ehlers Lashunta Frieden, PT, DPT  Acute Rehabilitation [phone] pager [phone]) [phone] office  @ [place]: [phone]

## 2019-03-13 NOTE — Progress Notes (Signed)
Patient daughter called and wanted to come up prior to visiting hours I check with both Alekh and Dr. Kipp Brood and no one has a schedule meeting prior to visiting hours. I explained this and daughter was understanding. Daughter voiced concerns about a palliative consult to help bridge conversation about living will.

## 2019-03-13 NOTE — Care Management Important Message (Signed)
Important Message  Patient Details  Name: Sandra Cooper MRN: [id number] Date of Birth: 1930-05-20   Medicare Important Message Given:  Yes     Kiam Bransfield Montine Circle 03/13/2019, 3:39 PM

## 2019-03-13 NOTE — Progress Notes (Signed)
Physical Therapy Treatment Patient Details Name: Sandra Cooper MRN: [id number] DOB: 08/07/1929 Today's Date: 03/13/2019    History of Present Illness Patient is an 85 year old female admitted with hypoxia. Had lung biopsy and since then has had hypoxia. PMH to inlcude: asthma, HTN, lung nodule.    PT Comments    I spoke at length with pt and her daughter re: rehab vs home.  If pt were to d/c home today or tomorrow, I still believe rehab would be beneficial.  She could easily progress to HHPT if given several days here in the hospital.  She currently stays in the 90s at rest and increases to 93% during gait on RA when tested today (see O2 qualification note).  If her son can provide 24/7 care after her daughter leaves Thursday, she could safely go home with home services.  If she cannot have 24/7 care, she will need SNF for rehab to be safe prior to returning home with intermittent supervision.  PT will continue to follow acutely for safe mobility progression.  The pt's daughter is going to talk to her brother today to see what he could provide. PT will continue to follow acutely for safe mobility progression.  Follow Up Recommendations  SNF     Equipment Recommendations  Rolling walker with 5" wheels;Other (comment)(she may have one at home)    Recommendations for Other Services   NA     Precautions / Restrictions Precautions Precautions: Fall Precaution Comments: h/o falls    Mobility  Bed Mobility Overal bed mobility: Modified Independent                Transfers Overall transfer level: Needs assistance Equipment used: 1 person hand held assist;Quad cane Transfers: Sit to/from Stand Sit to Stand: Min guard         General transfer comment: Min guard steadying assistance once standing  Ambulation/Gait Ambulation/Gait assistance: Min assist Gait Distance (Feet): 15 Feet(x2) Assistive device: 1 person hand held assist;Quad cane Gait Pattern/deviations: Step-through  pattern;Staggering right;Staggering left Gait velocity: very slow Gait velocity interpretation: <1.31 ft/sec, indicative of household ambulator General Gait Details: Pt with staggering gait pattern when using her quad cane (which she normally only uses when out and about).  Both daughter and I agree that she would benefit from more support (RW) for now until she regains her strength.  O2 sats during gait were 93% (better than when at rest on RA 90%).            Balance Overall balance assessment: Needs assistance Sitting-balance support: Feet supported;No upper extremity supported Sitting balance-Leahy Scale: Good     Standing balance support: Single extremity supported;Bilateral upper extremity supported Standing balance-Leahy Scale: Poor Standing balance comment: needs external support for balance in standing.                             Cognition Arousal/Alertness: Awake/alert Behavior During Therapy: WFL for tasks assessed/performed Overall Cognitive Status: Within Functional Limits for tasks assessed                                        Exercises General Exercises - Upper Extremity Shoulder Flexion: AROM;Both;5 reps Elbow Flexion: AROM;Both;5 reps General Exercises - Lower Extremity Ankle Circles/Pumps: AROM;Both;20 reps Heel Slides: AROM;Both;10 reps Hip ABduction/ADduction: AROM;Both;5 reps Straight Leg Raises: AROM;Both;5 reps  Pertinent Vitals/Pain Pain Assessment: Faces Faces Pain Scale: Hurts little more Pain Location: sternal Pain Descriptors / Indicators: Burning Pain Intervention(s): Limited activity within patient's tolerance;Monitored during session;Repositioned           PT Goals (current goals can now be found in the care plan section) Acute Rehab PT Goals Patient Stated Goal: to breathe better Progress towards PT goals: Progressing toward goals    Frequency    Min 3X/week      PT Plan Discharge plan  needs to be updated       AM-PAC PT "6 Clicks" Mobility   Outcome Measure  Help needed turning from your back to your side while in a flat bed without using bedrails?: None Help needed moving from lying on your back to sitting on the side of a flat bed without using bedrails?: None Help needed moving to and from a bed to a chair (including a wheelchair)?: A Little Help needed standing up from a chair using your arms (e.g., wheelchair or bedside chair)?: A Little Help needed to walk in hospital room?: A Little Help needed climbing 3-5 steps with a railing? : A Little 6 Click Score: 20    End of Session Equipment Utilized During Treatment: Oxygen;Gait belt(1 L) Activity Tolerance: Patient limited by fatigue Patient left: in chair;with call bell/phone within reach;with family/visitor present   PT Visit Diagnosis: Muscle weakness (generalized) (M62.81);Difficulty in walking, not elsewhere classified (R26.2);Other abnormalities of gait and mobility (R26.89)     Time: 7591-6384 PT Time Calculation (min) (ACUTE ONLY): 56 min  Charges:  $Gait Training: 23-37 mins $Therapeutic Exercise: 8-22 mins $Self Care/Home Management: 8-22                    Shelby Peltz B. Margarine Grosshans, PT, DPT  Acute Rehabilitation [phone] pager [phone] office  @ [place]: [phone]   03/13/2019, 12:39 PM

## 2019-03-13 NOTE — Consult Note (Signed)
NAME:  Sandra Cooper, MRN:  [id number], DOB:  1930/02/13, LOS: 3 ADMISSION DATE:  03/09/2019, CONSULTATION DATE:  03/13/2019 REFERRING MD:  Dr. Starla Cooper, [facility], CHIEF COMPLAINT:  Shortness of breath   Brief History   85 yo female with progress dyspnea found to have Lt lung mass.  Had biopsy by IR that was positive for adenocarcinoma.  She has cough with brown sputum.  PCCM asked to assist with evaluation of cough and dyspnea.  History of present illness   She grew up in [place], [place].  Had exposure to dust and pollution there and used to get bronchitis frequently.  Moved to [place] when she was 85 yrs old.  Also lived in [place] before moving to [place] 3 years ago.  Her husband used to smoke cigarettes, but she never did.  She had a mild case of pneumonia years ago.  No history of TB.  No animal/bird exposures.    Her daughter was visiting from [place] and noticed Sandra Cooper was getting more short of breath with activity.  She also has progressive cough.  During assessment of this by her PCP she had a chest xray from 01/31/19 that showed mass in Lt lung.  This was confirmed with CT chest.  PET had low level hypermetabolism.  She had biopsy by IR and results showed adenocarcinoma.  Her cough and dyspnea have gotten worse over the past few days.  She is bringing up dark brown phlegm.  She is not having chest pain, fever, wheeze, sweats, skin rash, joint swelling, or leg swelling.  Past Medical History  HTN, Asthma, OA, Osteopenia  Significant Hospital Events   10/08 Admit  Consults:  Thoracic surgery [facility]  Procedures:    Significant Diagnostic Tests:  CT angio chest 01/31/19 >> 2.6 cm Lt thyroid nodule, 2.4 cm LLL nodule with slight spiculation PET scan 02/15/19 >> 2.2 SUV Lt axillary area, low level hypermetabolism in both hila, 1.8 cm partially cavitary LLL nodule with SUV 2.3, mild centrilobular emphysema, 3 mm lingular nodule PFT 02/16/19 >> FEV1 1.30 (102%), FEV1% 75, TLC 7.88 (183%),  DLCO 79%, +BD Echo 03/11/19 >> EF 65 to 70%  Micro Data:    Antimicrobials:  Zithromax 10/12 >>    Interim history/subjective:    Objective   Blood pressure 131/71, pulse 97, temperature 98.1 F (36.7 C), temperature source Oral, resp. rate 18, weight 48.8 kg, SpO2 93 %.        Intake/Output Summary (Last 24 hours) at 03/13/2019 1129 Last data filed at 03/13/2019 9485 Gross per 24 hour  Intake 120 ml  Output 2750 ml  Net -2630 ml   Filed Weights   03/13/19 0429  Weight: 48.8 kg    Examination:  General - alert Eyes - pupils reactive ENT - no sinus tenderness, no stridor Cardiac - regular rate/rhythm, no murmur Chest - equal breath sounds b/l, no wheezing or rales Abdomen - soft, non tender, + bowel sounds Extremities - no cyanosis, clubbing, or edema Skin - no rashes Neuro - normal strength, moves extremities, follows commands Lymphatics - no lymphadenopathy Psych - normal mood and behavior GU - no lesions noted  Resolved Hospital Problem list     Assessment & Plan:   Dyspnea, productive cough. - recent PFT shows mild obstruction, air trapping, and positive bronchodilator response - recent chest imaging shows mild centrilobular emphysema - she reports frequent episodes of bronchitis - she has second hand tobacco exposure, and environmental pollution exposure - continue dulera, duoneb,  prendisone - add zithromax  Lt lower lung nodule positive for adenocarcinoma. - thoracic surgery to discuss with pt and family later this afternoon  Best practice:  Diet: regular diet DVT prophylaxis: SCDs GI prophylaxis: not indicated Mobility: as tolerated Code Status: full code Family Communication: updated pt's daughter at bedside Disposition: medical floor bed  Remainder of pulmonary assessment can be done as outpt if she is otherwise stable for discharge home.  Labs   CBC: Recent Labs  Lab 03/09/19 0929 03/10/19 1059 03/11/19 0158 03/12/19 0257  03/13/19 0305  WBC 5.6  --  7.3 5.7 12.5*  NEUTROABS  --   --  4.6 5.1 9.7*  HGB 15.2* 11.5* 11.1* 12.7 11.5*  HCT 46.6* 35.1* 33.9* 39.0 35.1*  MCV 89.3  --  89.0 88.6 89.3  PLT 336  --  241 278 676    Basic Metabolic Panel: Recent Labs  Lab 03/11/19 0158 03/12/19 0257 03/13/19 0305  NA 141 138 138  K 3.8 3.2* 4.8  CL 110 101 107  CO2 25 23 25   GLUCOSE 98 187* 110*  BUN 14 19 23   CREATININE 0.67 0.77 0.74  CALCIUM 9.0 9.5 9.3  MG  --  1.9 2.2   GFR: Estimated Creatinine Clearance: 35.6 mL/min (by C-G formula based on SCr of 0.74 mg/dL). Recent Labs  Lab 03/09/19 0929 03/11/19 0158 03/12/19 0257 03/13/19 0305  WBC 5.6 7.3 5.7 12.5*    ABG    Component Value Date/Time   PHART 7.377 03/11/2019 0451   PCO2ART 46.1 03/11/2019 0451   PO2ART 72.8 (L) 03/11/2019 0451   HCO3 26.5 03/11/2019 0451   O2SAT 93.9 03/11/2019 0451     Coagulation Profile: Recent Labs  Lab 03/09/19 0929  INR 0.9    Review of Systems:   Reviewed and negative  Past Medical History  She,  has a past medical history of Arthritis, Asthma, and Hypertension.   Surgical History    Past Surgical History:  Procedure Laterality Date  . APPENDECTOMY    . HERNIA REPAIR       Social History  She had second hand tobacco exposure.   Family History   Her family history is positive for heart disease.   Allergies Allergies  Allergen Reactions  . Fluoxetine Swelling     Home Medications  Prior to Admission medications   Medication Sig Start Date End Date Taking? Authorizing Provider  albuterol (VENTOLIN HFA) 108 (90 Base) MCG/ACT inhaler Inhale 2 puffs into the lungs every 6 (six) hours as needed for wheezing or shortness of breath.  09/02/18  Yes [provider]  amLODipine (NORVASC) 10 MG tablet Take 10 mg by mouth daily. 02/24/19  Yes [provider]  aspirin EC 81 MG tablet Take 81 mg by mouth at bedtime.    Yes [provider]  Calcium  Carb-Cholecalciferol (CALCIUM-VITAMIN D) 600-400 MG-UNIT TABS Take 1 tablet by mouth daily.   Yes [provider]  Camphor-Menthol-Methyl Sal (SALONPAS) 3.06-06-08 % PTCH Place 1 patch onto the skin daily as needed (pain).   Yes [provider]  Cholecalciferol (VITAMIN D) 50 MCG (2000 UT) CAPS Take 2,000 Units by mouth daily.   Yes [provider]  DHA-Vitamin C-Lutein (EYE HEALTH FORMULA PO) Take 1 tablet by mouth daily.   Yes [provider]  ibuprofen (ADVIL) 200 MG tablet Take 200-600 mg by mouth daily as needed for headache or mild pain.    Yes [provider]  losartan (COZAAR) 100 MG  tablet Take 100 mg by mouth every evening. 01/21/19  Yes [provider]  magnesium gluconate (MAGONATE) 500 MG tablet Take 1,000 mg by mouth daily.    Yes [provider]  mirtazapine (REMERON) 15 MG tablet Take 15 mg by mouth at bedtime. 11/29/18  Yes [provider]  Multiple Vitamin (MULTIVITAMIN) tablet Take 1 tablet by mouth daily.   Yes [provider]  Omega-3-6-9 CAPS Take 1 capsule by mouth 2 (two) times daily.    Yes [provider]  OVER THE COUNTER MEDICATION Take 1 tablet by mouth daily. Life Extension, Brain Support Supplement.   Yes [provider]  Potassium 99 MG TABS Take 99 mg by mouth daily.   Yes [provider]  vitamin B-12 (CYANOCOBALAMIN) 500 MCG tablet Take 500 mcg by mouth daily.   Yes [provider]    Chesley Mires, MD [facility] Pulmonary/Critical Care 03/13/2019, 11:50 AM

## 2019-03-14 DIAGNOSIS — Z515 Encounter for palliative care: Secondary | ICD-10-CM

## 2019-03-14 DIAGNOSIS — Z7189 Other specified counseling: Secondary | ICD-10-CM

## 2019-03-14 DIAGNOSIS — C3492 Malignant neoplasm of unspecified part of left bronchus or lung: Secondary | ICD-10-CM

## 2019-03-14 LAB — CBC WITH DIFFERENTIAL/PLATELET
Abs Immature Granulocytes: 0.05 10*3/uL (ref 0.00–0.07)
Basophils Absolute: 0 10*3/uL (ref 0.0–0.1)
Basophils Relative: 0 %
Eosinophils Absolute: 0.1 10*3/uL (ref 0.0–0.5)
Eosinophils Relative: 1 %
HCT: 34.6 % — ABNORMAL LOW (ref 36.0–46.0)
Hemoglobin: 11.6 g/dL — ABNORMAL LOW (ref 12.0–15.0)
Immature Granulocytes: 1 %
Lymphocytes Relative: 20 %
Lymphs Abs: 2 10*3/uL (ref 0.7–4.0)
MCH: 29.6 pg (ref 26.0–34.0)
MCHC: 33.5 g/dL (ref 30.0–36.0)
MCV: 88.3 fL (ref 80.0–100.0)
Monocytes Absolute: 0.8 10*3/uL (ref 0.1–1.0)
Monocytes Relative: 8 %
Neutro Abs: 7 10*3/uL (ref 1.7–7.7)
Neutrophils Relative %: 70 %
Platelets: 274 10*3/uL (ref 150–400)
RBC: 3.92 MIL/uL (ref 3.87–5.11)
RDW: 14 % (ref 11.5–15.5)
WBC: 9.9 10*3/uL (ref 4.0–10.5)
nRBC: 0 % (ref 0.0–0.2)

## 2019-03-14 LAB — BASIC METABOLIC PANEL
Anion gap: 10 (ref 5–15)
BUN: 25 mg/dL — ABNORMAL HIGH (ref 8–23)
CO2: 24 mmol/L (ref 22–32)
Calcium: 9.2 mg/dL (ref 8.9–10.3)
Chloride: 104 mmol/L (ref 98–111)
Creatinine, Ser: 0.76 mg/dL (ref 0.44–1.00)
GFR calc Af Amer: >60 mL/min (ref >60)
GFR calc non Af Amer: >60 mL/min (ref >60)
Glucose, Bld: 108 mg/dL — ABNORMAL HIGH (ref 70–99)
Potassium: 4.4 mmol/L (ref 3.5–5.1)
Sodium: 138 mmol/L (ref 135–145)

## 2019-03-14 LAB — MAGNESIUM: Magnesium: 2.2 mg/dL (ref 1.7–2.4)

## 2019-03-14 NOTE — Progress Notes (Signed)
Patient ID: Sandra Cooper, female   DOB: 1930-06-01, 85 y.o.   MRN: [id number]  PROGRESS NOTE    Sandra Cooper  ZSW:[id number] DOB: 1929-12-18 DOA: 03/09/2019 PCP: [name], MD   Brief Narrative:  85 year old female with history of asthma, hypertension and lung nodule status post lung biopsy by IR on 03/09/2019 and subsequently developed hemoptysis and increased oxygen requirement and had to be subsequently admitted under our service.  Hospitalist service was consulted for hypoxemia.  Assessment & Plan:   Acute hypoxic respiratory failure after IR guided lung biopsy on 03/09/2019 for a lung nodule Question of asthma exacerbation -Currently still requiring 2 L oxygen.  IR following.   -Continue nebs and Dulera.  Solu-Medrol has been switched to oral prednisone 40 mg daily.  Continue prednisone 40 mg daily for total of 5 days. -Echo showed EF of 65 to 70% with impaired relaxing pattern of LV diastolic filling. -Might need supplemental oxygen on discharge.  New diagnosis of left lung adenocarcinoma -Lung nodule biopsy pathology positive for adenocarcinoma.  Dr. Kipp Brood spoke to the family regarding the same already.  Will need outpatient oncology evaluation.  Spoke to Dr. Gorsuch/oncology on phone on 03/14/2019 and outpatient oncology evaluation is being set up.  Acute blood loss anemia Hemoptysis -Most likely from the lung biopsy.  Still having intermittent cough with hemoptysis/blood clots.  PCCM evaluation appreciated.  Hemoglobin 11.6 this morning.  Hypertension  -Monitor blood pressure.  Continue amlodipine and losartan.  Generalized deconditioning -PT recommending SNF placement.  Social worker consulted. -Palliative care consulted for goals of care  Patient's care was transferred to [facility] on 03/13/2019 after I had discussed with IR PA on phone.  DVT prophylaxis: SCDs CODE STATUS: Full Family communication: Spoke to [employer] on phone on 03/13/2019 Disposition: SNF once  bed is available  Consultants: IR/PCCM/palliative care  Antibiotics Anti-infectives (From admission, onward)   Start     Dose/Rate Route Frequency Ordered Stop   03/14/19 1000  azithromycin (ZITHROMAX) tablet 250 mg     250 mg Oral Daily 03/13/19 1148 03/18/19 0959   03/13/19 1200  azithromycin (ZITHROMAX) tablet 500 mg     500 mg Oral  Once 03/13/19 1148 03/13/19 1344       Subjective: Patient seen and examined at bedside.  Denies worsening shortness of breath or fever overnight.  Still having cough with intermittent dark sputum.  Still feels weak and short of breath with even minimal exertion.    Objective: Vitals:   03/13/19 0429 03/13/19 1234 03/13/19 2053 03/14/19 0736  BP:      Pulse:    74  Resp:    18  Temp:      TempSrc:      SpO2:  90% 92% 96%  Weight: 48.8 kg       Intake/Output Summary (Last 24 hours) at 03/14/2019 0815 Last data filed at 03/14/2019 0626 Gross per 24 hour  Intake 520 ml  Output 2825 ml  Net -2305 ml   Filed Weights   03/13/19 0429  Weight: 48.8 kg    Examination:  General exam: No distress.  Elderly female lying in bed. Respiratory system: Bilateral decreased breath sounds at bases with scattered crackles  cardiovascular system: Rate controlled, S1-S2 heard Gastrointestinal system: Abdomen is nondistended, soft and nontender. Normal bowel sounds heard. Extremities: No cyanosis, edema  Data Reviewed: I have personally reviewed following labs and imaging studies  CBC: Recent Labs  Lab 03/09/19 0929 03/10/19 1059 03/11/19 0158 03/12/19 0257 03/13/19  0305 03/14/19 0300  WBC 5.6  --  7.3 5.7 12.5* 9.9  NEUTROABS  --   --  4.6 5.1 9.7* 7.0  HGB 15.2* 11.5* 11.1* 12.7 11.5* 11.6*  HCT 46.6* 35.1* 33.9* 39.0 35.1* 34.6*  MCV 89.3  --  89.0 88.6 89.3 88.3  PLT 336  --  241 278 271 865   Basic Metabolic Panel: Recent Labs  Lab 03/11/19 0158 03/12/19 0257 03/13/19 0305 03/14/19 0300  NA 141 138 138 138  K 3.8 3.2* 4.8 4.4   CL 110 101 107 104  CO2 25 23 25 24   GLUCOSE 98 187* 110* 108*  BUN 14 19 23  25*  CREATININE 0.67 0.77 0.74 0.76  CALCIUM 9.0 9.5 9.3 9.2  MG  --  1.9 2.2 2.2   GFR: Estimated Creatinine Clearance: 35.6 mL/min (by C-G formula based on SCr of 0.76 mg/dL). Liver Function Tests: No results for input(s): AST, ALT, ALKPHOS, BILITOT, PROT, ALBUMIN in the last 168 hours. No results for input(s): LIPASE, AMYLASE in the last 168 hours. No results for input(s): AMMONIA in the last 168 hours. Coagulation Profile: Recent Labs  Lab 03/09/19 0929  INR 0.9   Cardiac Enzymes: No results for input(s): CKTOTAL, CKMB, CKMBINDEX, TROPONINI in the last 168 hours. BNP (last 3 results) No results for input(s): PROBNP in the last 8760 hours. HbA1C: No results for input(s): HGBA1C in the last 72 hours. CBG: No results for input(s): GLUCAP in the last 168 hours. Lipid Profile: No results for input(s): CHOL, HDL, LDLCALC, TRIG, CHOLHDL, LDLDIRECT in the last 72 hours. Thyroid Function Tests: No results for input(s): TSH, T4TOTAL, FREET4, T3FREE, THYROIDAB in the last 72 hours. Anemia Panel: No results for input(s): VITAMINB12, FOLATE, FERRITIN, TIBC, IRON, RETICCTPCT in the last 72 hours. Sepsis Labs: No results for input(s): PROCALCITON, LATICACIDVEN in the last 168 hours.  Recent Results (from the past 240 hour(s))  Novel Coronavirus, NAA (Hosp order, Send-out to Ref Lab; TAT 18-24 hrs     Status: None   Collection Time: 03/06/19  9:54 AM   Specimen: Nasopharyngeal Swab; Respiratory  Result Value Ref Range Status   SARS-CoV-2, NAA NOT DETECTED NOT DETECTED Final    Comment: (NOTE) This nucleic acid amplification test was developed and its performance characteristics determined by [employer]. Nucleic acid amplification tests include PCR and TMA. This test has not been FDA cleared or approved. This test has been authorized by FDA under an Emergency Use Authorization (EUA). This test  is only authorized for the duration of time the declaration that circumstances exist justifying the authorization of the emergency use of in vitro diagnostic tests for detection of SARS-CoV-2 virus and/or diagnosis of COVID-19 infection under section 564(b)(1) of the Act, 21 U.S.C. 784ONG-2(X) (1), unless the authorization is terminated or revoked sooner. When diagnostic testing is negative, the possibility of a false negative result should be considered in the context of a patient's recent exposures and the presence of clinical signs and symptoms consistent with COVID-19. An individual without symptoms of COVID- 19 and who is not shedding SARS-CoV-2 vi rus would expect to have a negative (not detected) result in this assay. Performed At: [facility] [address] [place], [place] [id number] Rush Farmer MD WN:[id number]    [place]  Final    Comment: Performed at [facility], [place] [address], [place], [name] [address]         Radiology Studies: No results found.  Scheduled Meds: . amLODipine  5 mg Oral Daily  . azithromycin  250 mg Oral Daily  . losartan  100 mg Oral QPM  . mirtazapine  15 mg Oral QHS  . mometasone-formoterol  2 puff Inhalation BID  . predniSONE  40 mg Oral Q breakfast   Continuous Infusions:         Aline August, MD [facility] 03/14/2019, 8:15 AM

## 2019-03-14 NOTE — Progress Notes (Signed)
Responded to spiritual are consult. Pt was alert and daughter was at bedside. Daughter dismissed herself from the room stating she thought her Mom would benefit privacy. Dallie briefly told me about her recent health history and the plan forward. She seems a little anxious about her prognosis, but she says that her faith will see her through. She said she has been Catholic her whole life.   I offered to connect her with her parish ([facility]) but she requested privacy within the family and with chaplain only. I offered her spiritual care with empathic listening, words of encouragement, ministry of presence, and prayer. Yisel seemed very happy with the [place] visit. Daughter stated appreciation and continued support for her mom because she was from out of town.   Palliative Care Chaplain  Resident Fidel Levy [phone]

## 2019-03-14 NOTE — TOC Progression Note (Signed)
Transition of Care [facility]) - Progression Note    Patient Details  Name: Sandra Cooper MRN: [id number] Date of Birth: 1930-05-14  Transition of [facility]) CM/SW [place], LCSW Phone Number: 03/14/2019, 4:09 PM  Clinical Narrative:    [facility]) is able to offer a private room for patient and will start insurance authorization. MD ordering an updated COVID test as requested in anticipation of discharge tomorrow. Patient's daughter updated.    Expected Discharge Plan: [place] Barriers to Discharge: Continued Medical Work up, Ship broker  Expected Discharge Plan and Services Expected Discharge Plan: [place] In-house Referral: Clinical Social Work Discharge Planning Services: NA Post Acute Care Choice: [place] Living arrangements for the past 2 months: Single Family Home                 DME Arranged: N/A DME Agency: NA       HH Arranged: NA HH Agency: NA         Social Determinants of Health (SDOH) Interventions    Readmission Risk Interventions No flowsheet data found.

## 2019-03-14 NOTE — Progress Notes (Addendum)
Occupational Therapy Evaluation Patient Details Name: Sandra Cooper MRN: [id number] DOB: 30-Jun-1929 Today's Date: 03/14/2019    History of Present Illness Patient is an 85 year old female admitted with hypoxia. Had lung biopsy and since then has had hypoxia. PMH to inlcude: asthma, HTN, lung nodule.   Clinical Impression   PTA, pt was living at home alone and was independent with ADL/IADL and functional mobility at spc level. Pt currently requires minguard-minA for functional mobility. Upon arrival pt on 1lnc, SpO2 89% following bed mobility, pt required 2lnc with pursed lip breathing to increase >92%. Pt remained on 2lnc during bathing at sink level, SpO2 90%-93% with frequent rest breaks and pursed lip breathing. Pt and her daughter (who was present throughout session) has many questions regarding activity tolerance, possible home O2 use, and activity progression. Provided education handouts on energy conservation and importance of oxygen saturation. Due to decline in current level of function, pt would benefit from acute OT to address established goals to facilitate safe D/C to venue listed below. At this time, recommend SNF follow-up unless family is able to provide 24/7 assistance. Will continue to follow acutely.     Follow Up Recommendations  SNF;Supervision/Assistance - 24 hour;Home health OT    Equipment Recommendations  3 in 1 bedside commode(rollator ) (pt reports she has long hallways in her home)   Recommendations for Other Services       Precautions / Restrictions Precautions Precautions: Fall Precaution Comments: h/o falls Restrictions Weight Bearing Restrictions: No      Mobility Bed Mobility Overal bed mobility: Needs Assistance Bed Mobility: Supine to Sit     Supine to sit: Min guard     General bed mobility comments: for safety  Transfers Overall transfer level: Needs assistance Equipment used: Rolling walker (2 wheeled) Transfers: Sit to/from Stand Sit to  Stand: Min guard;Min assist         General transfer comment: steadying assistance once standing    Balance Overall balance assessment: Needs assistance Sitting-balance support: Feet supported;No upper extremity supported Sitting balance-Leahy Scale: Good     Standing balance support: Single extremity supported;Bilateral upper extremity supported Standing balance-Leahy Scale: Poor Standing balance comment: needs external support for balance in standing.                            ADL either performed or assessed with clinical judgement   ADL Overall ADL's : Needs assistance/impaired Eating/Feeding: Set up;Sitting   Grooming: Set up;Sitting Grooming Details (indicate cue type and reason): completed at sink level Upper Body Bathing: Set up;Sitting Upper Body Bathing Details (indicate cue type and reason): completed at sink level Lower Body Bathing: Min guard;Sit to/from stand   Upper Body Dressing : Set up;Sitting   Lower Body Dressing: Min guard;Sit to/from stand   Toilet Transfer: Minimal assistance;Ambulation;RW Toilet Transfer Details (indicate cue type and reason): simulated in room Toileting- Clothing Manipulation and Hygiene: Minimal assistance;Sit to/from stand       Functional mobility during ADLs: Min guard;Minimal assistance;Rolling walker General ADL Comments: pt completed full body bathing while sitting at sink level;pt on 2lnc throuhgout session SpOw 94%-90% with cues for pursed lip breathing     Vision Baseline Vision/History: Wears glasses Wears Glasses: At all times Patient Visual Report: No change from baseline       Perception     Praxis      Pertinent Vitals/Pain Pain Assessment: No/denies pain     Hand Dominance  Right   Extremity/Trunk Assessment Upper Extremity Assessment Upper Extremity Assessment: Overall WFL for tasks assessed   Lower Extremity Assessment Lower Extremity Assessment: Defer to PT evaluation   Cervical /  Trunk Assessment Cervical / Trunk Assessment: Normal   Communication Communication Communication: No difficulties   Cognition Arousal/Alertness: Awake/alert Behavior During Therapy: WFL for tasks assessed/performed Overall Cognitive Status: Within Functional Limits for tasks assessed                                     General Comments  pt on 1lnc at rest, SpO2 dropped to 89, required 2lnc to bump up to 95%, kept on 2lnc throughout ADL, SpO2 90%-93% with frequent rest breaks and pursed lip breathing;pt's daughter present during session    Exercises     Shoulder Instructions      Home Living Family/patient expects to be discharged to:: Private residence Living Arrangements: Alone Available Help at Discharge: Family;Available PRN/intermittently Type of Home: House Home Access: Stairs to enter [employer] of Steps: 3   Home Layout: One level     Bathroom Shower/Tub: Occupational psychologist: Standard     Home Equipment: Cane - single point   Additional Comments: walked around in the house without anything, used cane when out of the home      Prior Functioning/Environment Level of Independence: Independent;Independent with assistive device(s)                 OT Problem List: Decreased activity tolerance;Impaired balance (sitting and/or standing);Decreased safety awareness;Decreased knowledge of use of DME or AE;Decreased knowledge of precautions      OT Treatment/Interventions: Self-care/ADL training;Therapeutic exercise;Energy conservation;DME and/or AE instruction;Therapeutic activities;Patient/family education;Balance training    OT Goals(Current goals can be found in the care plan section) Acute Rehab OT Goals Patient Stated Goal: to go back home OT Goal Formulation: With patient Time For Goal Achievement: 03/28/19 Potential to Achieve Goals: Good ADL Goals Pt Will Perform Grooming: with modified independence;sitting Pt  Will Perform Upper Body Dressing: with modified independence;sitting Pt Will Perform Lower Body Dressing: with modified independence;sit to/from stand Pt Will Transfer to Toilet: with modified independence;ambulating Pt Will Perform Toileting - Clothing Manipulation and hygiene: with modified independence;sit to/from stand  OT Frequency: Min 2X/week   Barriers to D/C:            Co-evaluation              AM-PAC OT "6 Clicks" Daily Activity     Outcome Measure Help from another person eating meals?: A Little Help from another person taking care of personal grooming?: A Little Help from another person toileting, which includes using toliet, bedpan, or urinal?: A Little Help from another person bathing (including washing, rinsing, drying)?: A Little Help from another person to put on and taking off regular upper body clothing?: A Little Help from another person to put on and taking off regular lower body clothing?: A Little 6 Click Score: 18   End of Session Equipment Utilized During Treatment: Gait belt;Rolling walker;Oxygen Nurse Communication: Mobility status  Activity Tolerance: Patient tolerated treatment well Patient left: in bed;with call bell/phone within reach;with bed alarm set;with family/visitor present  OT Visit Diagnosis: Other abnormalities of gait and mobility (R26.89);Muscle weakness (generalized) (M62.81)                Time: 7628-3151 OT Time Calculation (min): 60 min Charges:  OT General Charges $OT Visit: 1 Visit OT Evaluation $OT Eval Moderate Complexity: 1 Mod OT Treatments $Self Care/Home Management : 38-52 mins  Dorinda Hill OTR/L Acute Rehabilitation Services Office: [place] 03/14/2019, 1:23 PM

## 2019-03-14 NOTE — Progress Notes (Signed)
Physical Therapy Treatment Patient Details Name: Sandra Cooper MRN: [id number] DOB: 03-Jun-1929 Today's Date: 03/14/2019    History of Present Illness Patient is an 85 year old female admitted with hypoxia. Had lung biopsy and since then has had hypoxia. PMH to inlcude: asthma, HTN, lung nodule.    PT Comments    Pt progressing towards goals, however, continues to be limited secondary to fatigue. Performed 2 trials of gait training this session with seated rest in between. 1 LOB noted, requiring min A for steadying. Oxygen sats at 90-91% on RA throughout gait. Extensive education provided to daughter about DME recommendations, energy conservation, and activity progression, should she take the patient home at d/c. If daughter unable to provide 24/7 assist, feel pt would continue to benefit from SNF. If able to provide 24/7 assist, would benefit from [place]. Will continue to follow acutely to maximize functional mobility independence and safety.     Follow Up Recommendations  SNF;Supervision/Assistance - 24 hour;Home health PT     Equipment Recommendations  Other (comment);3in1 (PT)(rollator (4 wheeled walker with seat))    Recommendations for Other Services       Precautions / Restrictions Precautions Precautions: Fall Precaution Comments: h/o falls Restrictions Weight Bearing Restrictions: No    Mobility  Bed Mobility Overal bed mobility: Needs Assistance Bed Mobility: Supine to Sit     Supine to sit: Supervision     General bed mobility comments: Supervision for safety.   Transfers Overall transfer level: Needs assistance Equipment used: Rolling walker (2 wheeled) Transfers: Sit to/from Stand Sit to Stand: Min guard         General transfer comment: Min guard A for safety. Cues for safe hand placement.   Ambulation/Gait Ambulation/Gait assistance: Min guard;Min assist Gait Distance (Feet): 35 Feet(15', 20' ) Assistive device: Rolling walker (2 wheeled) Gait  Pattern/deviations: Step-through pattern;Staggering right;Staggering left Gait velocity: Decreased   General Gait Details: Improved steadiness noted with use of RW. One mild posterior LOB noted, requiring min A for steadying. Pt requiring seated rest in between gait trials. Oxygen sats at 90-91% on RA during gait.    Stairs             Wheelchair Mobility    Modified Rankin (Stroke Patients Only)       Balance Overall balance assessment: Needs assistance Sitting-balance support: Feet supported;No upper extremity supported Sitting balance-Leahy Scale: Good     Standing balance support: Bilateral upper extremity supported;During functional activity Standing balance-Leahy Scale: Poor Standing balance comment: Reliant on UE support                             Cognition Arousal/Alertness: Awake/alert Behavior During Therapy: WFL for tasks assessed/performed Overall Cognitive Status: Within Functional Limits for tasks assessed                                        Exercises      General Comments General comments (skin integrity, edema, etc.): Extensive education to pt's daughter about activity progression at home including walking program. Educated about energy conservation techniques and DME recommendations.       Pertinent Vitals/Pain Pain Assessment: No/denies pain    Home Living                      Prior Function  PT Goals (current goals can now be found in the care plan section) Acute Rehab PT Goals Patient Stated Goal: to go back home PT Goal Formulation: With patient/family Time For Goal Achievement: 03/18/19 Potential to Achieve Goals: Fair Progress towards PT goals: Progressing toward goals    Frequency    Min 3X/week      PT Plan Equipment recommendations need to be updated    Co-evaluation              AM-PAC PT "6 Clicks" Mobility   Outcome Measure  Help needed turning from your  back to your side while in a flat bed without using bedrails?: None Help needed moving from lying on your back to sitting on the side of a flat bed without using bedrails?: None Help needed moving to and from a bed to a chair (including a wheelchair)?: A Little Help needed standing up from a chair using your arms (e.g., wheelchair or bedside chair)?: A Little Help needed to walk in hospital room?: A Little Help needed climbing 3-5 steps with a railing? : A Lot 6 Click Score: 19    End of Session   Activity Tolerance: Patient limited by fatigue Patient left: in chair;with call bell/phone within reach;with family/visitor present Nurse Communication: Mobility status PT Visit Diagnosis: Muscle weakness (generalized) (M62.81);Difficulty in walking, not elsewhere classified (R26.2);Other abnormalities of gait and mobility (R26.89)     Time: 7001-7494 PT Time Calculation (min) (ACUTE ONLY): 32 min  Charges:  $Gait Training: 8-22 mins $Self Care/Home Management: 8-22                     Leighton Ruff, PT, DPT  Acute Rehabilitation Services  Pager: [phone] Office: [phone]    Rudean Hitt 03/14/2019, 6:17 PM

## 2019-03-14 NOTE — TOC Progression Note (Signed)
Transition of Care [facility]) - Progression Note    Patient Details  Name: Sandra Cooper MRN: [id number] Date of Birth: 1929/09/07  Transition of Care [facility]) CM/SW [place], LCSW Phone Number: 03/14/2019, 10:52 AM  Clinical Narrative:    CSW spoke with patient's daughter, [name], regarding discharge plan. She reports that they would like to pursue SNF rehab, with a request for [facility] if they have a private room. [facility] is checking and will let CSW know.    Expected Discharge Plan: [place] Barriers to Discharge: Continued Medical Work up, Ship broker  Expected Discharge Plan and Services Expected Discharge Plan: [place] In-house Referral: Clinical Social Work Discharge Planning Services: NA Post Acute Care Choice: [place] Living arrangements for the past 2 months: Single Family Home                 DME Arranged: N/A DME Agency: NA       HH Arranged: NA HH Agency: NA         Social Determinants of Health (SDOH) Interventions    Readmission Risk Interventions No flowsheet data found.

## 2019-03-14 NOTE — Consult Note (Signed)
Consultation Note Date: 03/14/2019   Patient Name: Sandra Cooper  DOB: [birth year]  MRN: [id number]  Age / Sex: 85 y.o., female   PCP: [name], MD Referring Physician: Aline August, MD   REASON FOR CONSULTATION:Establishing goals of care  Palliative Care consult requested for this 85 y.o. female with multiple medical problems including hypertension, tobacco use, and asthma. She was admitted s/p lung biopsy due to pulmonary nodule on 03/09/19 where she developed shortness of and hemoptysis requiring increased oxygen use. Since admission she continues to require oxygen support. Lung biopsy was positive for adenocarcinoma and patient is pending outpatient follow up with Dr. Alvy Bimler and Dr. Kipp Brood. Palliative Medicine team consulted for goals of care.   Clinical Assessment and Goals of Care: I have reviewed medical records including lab results, imaging, Epic notes, and MAR, received report from the bedside RN, and assessed the patient. I met at the bedside with patient, her daughter Sandra Cooper, and her son Sandra Cooper via speaker phone; to discuss diagnosis prognosis, [place], EOL wishes, disposition and options. Patient awake, A&O x4. She states she has some chest discomfort at times when coughing and shortness of breath on exertion.   I introduced Palliative Medicine as specialized medical care for people living with serious illness. It focuses on providing relief from the symptoms and stress of a serious illness. The goal is to improve quality of life for both the patient and the family.  We discussed a brief life review of the patient, along with her functional and nutritional status. Prior to admission patient reports she lived alone and was very independent. Her husband passed away over a year ago. She has 2 children Sandra Cooper who lives locally and provides support and her daughter Sandra Cooper who lives in [place], but is here visiting due to patient's condition.   Patient reports prior to admission she  was able to perform all ADLs independently. She used a can for stability at times. She was able to drive and take herself to appointments. She reports her appetite has been great, but she did have a period where she loss over 20lb due to decreased appetite but feels this has improved and she states she has gained at least 10 of the pounds back.   We discussed Her current illness and what it means in the larger context of Her on-going co-morbidities. With specific discussions regarding her newly diagnosed adenocarcinoma. Natural disease trajectory and expectations at EOL were discussed. Ms. Diluzio and children verbalized their understanding and the severity of her condition.   They expressed mutually they have a strong Catholic faith and are remaining hopeful for treatment options. Son states the doctor told her that it was good they found it early and this gives her better chances. He states this has increased their faith and hope that she will do well, however they are still somewhat anxious of the unknown, what her care will look like in the future, and what options are available. Support given. Family states they really want to meet with Oncology and gain a better understanding of the outlook and options.   Ms. Rauls expressed she is planning to proceed with all available treatment options including surgical interventions if necessary. She states "I am not ready to go. I am strong and independent and I am hoping this does not change me!"   Family share their awareness of her generalized weakness and concerns for oxygen use. Daughter shares how patient is extremely short of breath moving around in  her hospital room and if oxygen is off seems more exacerbated. Support given and we further discussed trajectory. Family shares they are discussing discharge options of going home with home health versus SNF. We discussed the pros and cons of both at family's request. Daughter states she is just moreso concerned with  the inability to visit and the risk of her mother contracting COVID and not doing well with known lung cancer. Family states they are continuing their discussions on the best options. Sandra Cooper shares she has to return to [place] for at least a week and get some things in order if they decided patient was to come home and unfortunately she has to quarantine for 14 days on her return so she is debating what the best option is for them at this time.    I attempted to elicit values and goals of care important to the patient.    Patient is clear that she wishes to proceed with full aggressive medical care and interventions.   Patient states this was not the diagnosis she was expecting and she is processing all of the information. She again shares her strong Catholic faith. I offered Spiritual support while hospitalized. Patient and family verbalized appreciation and would like to have a visit. Daughter also requesting counseling or therapy support once she becomes active at the cancer center if this resource is available.   Advanced directives, concepts specific to code status, artifical feeding and hydration, and rehospitalization were considered and discussed. Patient states she has a living will that only specifies her durable and HCPOA which is both of her children Sandra Cooper and [place]). She states her documents do not speak to EOL wishes. Family requested to discuss more. I educated patient and family in detail regarding advanced directives. I discussed at length her current full code status with consideration to her current illness. Both patient and her children requested to remain a full code. They expressed appreciation for extensive discussion and that they plan to further discuss as this is their first time ever discussing these topics and they did not want to make a haste decision. Patient states her initial thought is she would be ok to undergo CPR and intubation if required allowing her time to see how she  does, however if the doctors stated she would not have a meaningful recovery she would not want to remain on prolonged support and would not want a tracheostomy.   Patient and family given a copy of the MOST form, Advanced Directives packet, and Hard Choices booklet for review and to guide their continued discussions.  Hospice and Palliative Care services outpatient were explained and offered. Patient and family verbalized their understanding and awareness of both palliative and hospice's goals and philosophy of care. At this time patient and family is open to outpatient palliative support given expressed goals for continued aggressive medical interventions and care.   I introduced patient and family to my colleague Wadie Lessen, NP and her role at the [facility]. They expressed interest in following up with her there versus home-based palliative.   Questions and concerns were addressed. The family was encouraged to call with questions or concerns.  PMT will continue to support holistically.   SOCIAL HISTORY:     reports that she has been smoking cigarettes. She has never used smokeless tobacco. She reports that she does not drink alcohol or use drugs.  CODE STATUS: Full code  ADVANCE DIRECTIVES: Next of Kin Sandra Cooper and Brad-children)   SYMPTOM MANAGEMENT:  per attending   Palliative Prophylaxis:   Aspiration, Bowel Regimen, Delirium Protocol, Frequent Pain Assessment and Oral Care  PSYCHO-SOCIAL/SPIRITUAL:  Support System: Family   Desire for further Chaplaincy support: Yes  Additional Recommendations (Limitations, Scope, Preferences):  Full Scope Treatment and No Tracheostomy   PAST MEDICAL HISTORY: Past Medical History:  Diagnosis Date  . Arthritis   . Asthma   . Hypertension     PAST SURGICAL HISTORY:  Past Surgical History:  Procedure Laterality Date  . APPENDECTOMY    . HERNIA REPAIR      ALLERGIES:  is allergic to fluoxetine.   MEDICATIONS:  Current  Facility-Administered Medications  Medication Dose Route Frequency Provider Last Rate Last Dose  . acetaminophen (TYLENOL) suppository 650 mg  650 mg Rectal Q6H PRN Arne Cleveland, MD      . acetaminophen (TYLENOL) tablet 650 mg  650 mg Oral Q6H PRN Arne Cleveland, MD   650 mg at 03/12/19 1039  . albuterol (PROVENTIL) (2.5 MG/3ML) 0.083% nebulizer solution 2.5 mg  2.5 mg Nebulization Q2H PRN Aline August, MD   2.5 mg at 03/13/19 2052  . amLODipine (NORVASC) tablet 5 mg  5 mg Oral Daily Ascencion Dike, PA-C   5 mg at 03/14/19 7591  . azithromycin (ZITHROMAX) tablet 250 mg  250 mg Oral Daily Chesley Mires, MD   250 mg at 03/14/19 6384  . bisacodyl (DULCOLAX) EC tablet 5 mg  5 mg Oral Daily PRN Arne Cleveland, MD      . HYDROcodone-acetaminophen (NORCO/VICODIN) 5-325 MG per tablet 1-2 tablet  1-2 tablet Oral Q4H PRN Ascencion Dike, PA-C      . levalbuterol Penne Lash) nebulizer solution 0.63 mg  0.63 mg Nebulization Q6H PRN Arne Cleveland, MD      . losartan (COZAAR) tablet 100 mg  100 mg Oral QPM Ascencion Dike, PA-C   100 mg at 03/14/19 1707  . magnesium citrate solution 1 Bottle  1 Bottle Oral Once PRN Arne Cleveland, MD      . mirtazapine (REMERON) tablet 15 mg  15 mg Oral QHS Ascencion Dike, PA-C   15 mg at 03/13/19 2330  . mometasone-formoterol (DULERA) 200-5 MCG/ACT inhaler 2 puff  2 puff Inhalation BID Merton Border, MD   2 puff at 03/14/19 0736  . ondansetron (ZOFRAN) injection 4 mg  4 mg Intravenous Q6H PRN Arne Cleveland, MD      . ondansetron [facility]) tablet 4 mg  4 mg Oral Q6H PRN Arne Cleveland, MD      . predniSONE (DELTASONE) tablet 40 mg  40 mg Oral Q breakfast Aline August, MD   40 mg at 03/14/19 0828  . senna-docusate (Senokot-S) tablet 1 tablet  1 tablet Oral QHS PRN Arne Cleveland, MD      . zolpidem (AMBIEN) tablet 5 mg  5 mg Oral QHS PRN Arne Cleveland, MD        VITAL SIGNS: BP 117/66 (BP Location: Left Arm)   Pulse 86   Temp 98 F (36.7 C) (Oral)   Resp 18    Wt 48.8 kg   SpO2 93%   BMI 20.99 kg/m  Filed Weights   03/13/19 0429  Weight: 48.8 kg    Estimated body mass index is 20.99 kg/m as calculated from the following:   Height as of an earlier encounter on 03/09/19: 5' (1.524 m).   Weight as of this encounter: 48.8 kg.  LABS: CBC:    Component Value Date/Time   WBC 9.9 03/14/2019 0300   HGB 11.6 (  L) 03/14/2019 0300   HCT 34.6 (L) 03/14/2019 0300   PLT 274 03/14/2019 0300   Comprehensive Metabolic Panel:    Component Value Date/Time   NA 138 03/14/2019 0300   K 4.4 03/14/2019 0300   CO2 24 03/14/2019 0300   BUN 25 (H) 03/14/2019 0300   CREATININE 0.76 03/14/2019 0300   ALBUMIN 4.1 01/31/2019 1221     Review of Systems  Constitutional: Positive for fatigue.  Respiratory: Positive for cough and shortness of breath.   Neurological: Positive for weakness.  Unless otherwise noted, a complete review of systems is negative.  Physical Exam General: NAD,  thin Cardiovascular: regular rate and rhythm Pulmonary: diminished bases, shortness of breath on exertion  Abdomen: soft, nontender, + bowel sounds Extremities: no edema, no joint deformities Skin: no rashes Neurological: A&O x4, mood appropriate    Prognosis: Unable to determine  Discharge Planning:  Home with Home Health and Palliative follow up at the [facility].   Recommendations:  Full Code-per patient's request. States she would not want prolonged intubation or tracheostomy. Wishes to continue discussion with family. Family given MOST form, Hard Choices booklet, and AD packet for further review and guide for continued discussions.   Patient requesting Palliative follow-[facility] at the [facility]. I will notify my colleague Wadie Lessen, NP.   Spiritual Support consult placed for emotional support and prayer. Patient concerned about potential loss of independency and also coping with newly diagnosed lung adenocarcinoma. Patient would also benefit for outpatient  continued counseling and or support at the [facility].   PMT will continue to follow and support as needed.    Palliative Performance Scale: PPS 50%              Patient and her children Sandra Cooper and Sandra Cooper) expressed understanding and was in agreement with this plan.   Thank you for allowing the Palliative Medicine Team to assist in the care of this patient.  Time In: 1230 Time Out: 1340 Time Total: 70 min.   Visit consisted of counseling and education dealing with the complex and emotionally intense issues of symptom management and palliative care in the setting of serious and potentially life-threatening illness.Greater than 50%  of this time was spent counseling and coordinating care related to the above assessment and plan.  Signed by:  Alda Lea, AGPCNP-BC Palliative Medicine Team  Phone: [phone] Fax: [phone] Pager: [phone] Amion: Bjorn Pippin

## 2019-03-14 NOTE — Progress Notes (Signed)
PCCM Interval Progress Note  Discussed with Dr. Remi Haggard.  Pt to likely be discharged later today with referral to outpatient oncology.  No need for PCCM to see today and no further needs at this time.  Please do not hesitate to call us back if we can be of any further assistance.   Montey Hora, [place] [facility] Pager: [phone].  If no answer, [phone] 03/14/2019, 8:47 AM

## 2019-03-14 NOTE — Progress Notes (Signed)
When PT worked with patient, patients oxygen saturations maintained 90-91% on room air with ambulation. Therapist and RN discussed with patient's daughter the cut off for insurance coverage of oxygen. Daughter upset and worried what could happen at home without having oxygen present. Daughter inquiring about how to get home oxygen without insurance coverage. Percell Locus called and updated about situation.

## 2019-03-15 LAB — NOVEL CORONAVIRUS, NAA (HOSP ORDER, SEND-OUT TO REF LAB; TAT 18-24 HRS): SARS-CoV-2, NAA: NOT DETECTED

## 2019-03-15 MED ORDER — AZITHROMYCIN 250 MG PO TABS: 250.0000 mg | ORAL_TABLET | Freq: Every day | ORAL | 0 refills | Status: DC

## 2019-03-15 MED ORDER — MOMETASONE FURO-FORMOTEROL FUM 200-5 MCG/ACT IN AERO: 2.0000 | INHALATION_SPRAY | Freq: Two times a day (BID) | RESPIRATORY_TRACT | 2 refills | Status: DC

## 2019-03-15 MED ORDER — ALUM & MAG HYDROXIDE-SIMETH 200-200-20 MG/5ML PO SUSP
15.0000 mL | Freq: Four times a day (QID) | ORAL | Status: DC | PRN
  Filled 2019-03-15: qty 30

## 2019-03-15 MED ORDER — ACETAMINOPHEN 325 MG PO TABS: 650.0000 mg | ORAL_TABLET | Freq: Four times a day (QID) | ORAL | Status: DC | PRN

## 2019-03-15 MED ORDER — AMLODIPINE BESYLATE 5 MG PO TABS: 5.0000 mg | ORAL_TABLET | Freq: Every day | ORAL | 0 refills | Status: DC

## 2019-03-15 MED ORDER — ALUM & MAG HYDROXIDE-SIMETH 200-200-20 MG/5ML PO SUSP: 15.0000 mL | Freq: Four times a day (QID) | ORAL | 0 refills | Status: DC | PRN

## 2019-03-15 MED ORDER — PREDNISONE 20 MG PO TABS: 40.0000 mg | ORAL_TABLET | Freq: Every day | ORAL | 0 refills | Status: DC

## 2019-03-15 MED ORDER — ALBUTEROL SULFATE HFA 108 (90 BASE) MCG/ACT IN AERS: 2.0000 | INHALATION_SPRAY | Freq: Four times a day (QID) | RESPIRATORY_TRACT | 2 refills | Status: AC | PRN

## 2019-03-15 NOTE — Progress Notes (Signed)
[facility] PROGRESS NOTE  Sandra Cooper SJG:[id number] DOB: 04/23/1930 DOA: 03/09/2019 PCP: [name], MD  Brief summary   85 year old female with history of asthma, hypertension and lung nodule status post lung biopsy by IR on 03/09/2019 and subsequently developed hemoptysis and increased oxygen requirement and had to be subsequently admitted under our service.  Hospitalist service was consulted for hypoxemia. Underwent lung biopsy. Pathology + adenocarcinoma   Assessment/Plan:  Acute hypoxic respiratory failure: multifactorial: s/p IR guided lung biopsy on 03/09/2019 for a lung nodule. + asthma. PFTs with mild obtsruction .  -hypoxia_.resolved with low flow oxygen. Will obtain desat study.  -continue nebs and Dulera.  Solu-Medrol has been switched to oral prednisone 40 mg daily, to complete of 5 days treatment course   New diagnosis of left lung adenocarcinoma.  -Lung nodule biopsy pathology positive for adenocarcinoma.  Dr. Kipp Brood spoke to the family regarding the same already. Plan to discuss on multidisciplinary meetign as outpatient  -f/u appointment is scheduled at oncology clinic on 10/15.  Dr. Gorsuch/oncology   Acute blood loss anemia. Hemoptysis. S/p biopsy. Hg is stable.   Hypertension  -Monitor blood pressure.  Continue amlodipine and losartan.  Generalized deconditioning. PT recommending SNF placement. Family declined. They want to go home with [facility].  Social worker consulted. -Palliative care consulted for goals of care  Dispo: home possible today. Needs HHC. eval for home oxygen  Code Status: prior  Family Communication: d/w  (indicate person spoken with, relationship, and if by phone, the number) Disposition Plan: home 24-48 hrs    Consultants: IR/PCCM/palliative care  Procedures:  Biopsy   Antibiotics: Anti-infectives (From admission, onward)   Start     Dose/Rate Route Frequency Ordered Stop   03/14/19 1000  azithromycin (ZITHROMAX) tablet  250 mg     250 mg Oral Daily 03/13/19 1148 03/18/19 0959   03/13/19 1200  azithromycin (ZITHROMAX) tablet 500 mg     500 mg Oral  Once 03/13/19 1148 03/13/19 1344        (indicate start date, and stop date if known)  HPI/Subjective: No acute distress. Reports mild cough, intermittent hemoptysis->impvoving. No acute SOB. D/w her family as well. Hoping to go home today. desat study.  Oncology follow up tomorrow   Objective: Vitals:   03/14/19 1315 03/14/19 2144  BP: 117/66   Pulse: 86   Resp: 18   Temp: 98 F (36.7 C)   SpO2: 93% 92%    Intake/Output Summary (Last 24 hours) at 03/15/2019 0911 Last data filed at 03/15/2019 0820 Gross per 24 hour  Intake -  Output 1650 ml  Net -1650 ml   Filed Weights   03/13/19 0429  Weight: 48.8 kg    Exam:   General:  No acute distress   Cardiovascular: s1,s2 rrr  Respiratory: mild diminished at bases   Abdomen: soft, nt   Musculoskeletal: no leg edema    Data Reviewed: Basic Metabolic Panel: Recent Labs  Lab 03/11/19 0158 03/12/19 0257 03/13/19 0305 03/14/19 0300  NA 141 138 138 138  K 3.8 3.2* 4.8 4.4  CL 110 101 107 104  CO2 25 23 25 24   GLUCOSE 98 187* 110* 108*  BUN 14 19 23  25*  CREATININE 0.67 0.77 0.74 0.76  CALCIUM 9.0 9.5 9.3 9.2  MG  --  1.9 2.2 2.2   Liver Function Tests: No results for input(s): AST, ALT, ALKPHOS, BILITOT, PROT, ALBUMIN in the last 168 hours. No results for input(s): LIPASE, AMYLASE in the last  168 hours. No results for input(s): AMMONIA in the last 168 hours. CBC: Recent Labs  Lab 03/09/19 0929 03/10/19 1059 03/11/19 0158 03/12/19 0257 03/13/19 0305 03/14/19 0300  WBC 5.6  --  7.3 5.7 12.5* 9.9  NEUTROABS  --   --  4.6 5.1 9.7* 7.0  HGB 15.2* 11.5* 11.1* 12.7 11.5* 11.6*  HCT 46.6* 35.1* 33.9* 39.0 35.1* 34.6*  MCV 89.3  --  89.0 88.6 89.3 88.3  PLT 336  --  241 278 271 274   Cardiac Enzymes: No results for input(s): CKTOTAL, CKMB, CKMBINDEX, TROPONINI in the last  168 hours. BNP (last 3 results) Recent Labs    01/31/19 1222  BNP 42.7    ProBNP (last 3 results) No results for input(s): PROBNP in the last 8760 hours.  CBG: No results for input(s): GLUCAP in the last 168 hours.  Recent Results (from the past 240 hour(s))  Novel Coronavirus, NAA (Hosp order, Send-out to Ref Lab; TAT 18-24 hrs     Status: None   Collection Time: 03/06/19  9:54 AM   Specimen: Nasopharyngeal Swab; Respiratory  Result Value Ref Range Status   SARS-CoV-2, NAA NOT DETECTED NOT DETECTED Final    Comment: (NOTE) This nucleic acid amplification test was developed and its performance characteristics determined by [employer]. Nucleic acid amplification tests include PCR and TMA. This test has not been FDA cleared or approved. This test has been authorized by FDA under an Emergency Use Authorization (EUA). This test is only authorized for the duration of time the declaration that circumstances exist justifying the authorization of the emergency use of in vitro diagnostic tests for detection of SARS-CoV-2 virus and/or diagnosis of COVID-19 infection under section 564(b)(1) of the Act, 21 U.S.C. 371GGY-6(R) (1), unless the authorization is terminated or revoked sooner. When diagnostic testing is negative, the possibility of a false negative result should be considered in the context of a patient's recent exposures and the presence of clinical signs and symptoms consistent with COVID-19. An individual without symptoms of COVID- 19 and who is not shedding SARS-CoV-2 vi rus would expect to have a negative (not detected) result in this assay. Performed At: [facility] [address] [place], [place] [id number] Rush Farmer MD JK:[id number]    [name]  Final    Comment: Performed at [facility], [place] [address], [place], [place] [address]     Studies: No results found.  Scheduled Meds: . amLODipine  5 mg Oral  Daily  . azithromycin  250 mg Oral Daily  . losartan  100 mg Oral QPM  . mirtazapine  15 mg Oral QHS  . mometasone-formoterol  2 puff Inhalation BID  . predniSONE  40 mg Oral Q breakfast   Continuous Infusions:  Active Problems:   Dyspnea    Time spent: >25 minutes     Kinnie Feil  [facility] Pager [phone]. If 7PM-7AM, please contact night-coverage at [email or web address], password [facility] 03/15/2019, 9:11 AM  LOS: 5 days

## 2019-03-15 NOTE — Progress Notes (Signed)
SATURATION QUALIFICATIONS: (This note is used to comply with regulatory documentation for home oxygen)  Patient Saturations on Room Air at Rest = 93%  Patient Saturations on Room Air while Ambulating = 89%  Please briefly explain why patient needs home oxygen: N/A Pt does not meet qualifications

## 2019-03-15 NOTE — Progress Notes (Signed)
Pt & daughter given discharge instructions, prescriptions, and care notes. Pt & daughter verbalized understanding AEB no further questions or concerns at this time. IV was discontinued, no redness, pain, or swelling noted at this time. Pt left the floor via wheelchair with staff in stable condition.

## 2019-03-15 NOTE — TOC Transition Note (Addendum)
Transition of Care [facility]) - CM/SW Discharge Note   Patient Details  Name: Sandra Cooper MRN: [id number] Date of Birth: 06-Dec-1929  [facility] CM/SW Contact:  Benard Halsted, LCSW Phone Number: 03/15/2019, 1:01 PM   Clinical Narrative:    CSW spoke with patient and daughter at bedside. They are now requesting patient return home with home health instead of SNF. CSW made [employer] aware. They are requesting home health through [facility], who has accepted referral for PT/OT/RN. They are also requesting 3in1, rollator w/ a seat, and oxygen. Daughter is aware that oxygen will be an out of pocket cost due to patient not currently qualifying with saturations, but they would like to proceed. [employer] aware of referral and will deliver all equipment to the room prior to patient discharging. Oxygen concentrator will be delivered to the home and payment will be processed at that time per [employer]. Charge RN assisting with Cancer diagnosis nutrition video per patient request. No other needs identified at this time.     Final next level of care: [place] Barriers to Discharge: Equipment Delay   Patient Goals and CMS Choice Patient states their goals for this hospitalization and ongoing recovery are:: Get stronger to return home CMS Medicare.gov Compare Post Acute Care list provided to:: Patient Choice offered to / list presented to : Patient, Adult Children  Discharge Placement                Patient to be transferred to facility by: Car Name of family member notified: Daughter, [name] Patient and family notified of of transfer: 03/15/19  Discharge Plan and Services In-house Referral: Clinical Social Work Discharge Planning Services: NA Post Acute Care Choice: [place]          DME Arranged: 3-N-1, Walker rolling with seat, Oxygen DME Agency: [employer] Date DME Agency Contacted: 03/15/19 Time DME Agency Contacted: 1301 Representative spoke  with at DME Agency: Thedore Mins HH Arranged: RN, PT, OT [facility] Agency: [place] Date [place]: 03/15/19 Time [place]: Index Representative spoke with at [place]: [place] ([place]) Interventions     Readmission Risk Interventions No flowsheet data found.

## 2019-03-15 NOTE — Progress Notes (Signed)
Physical Therapy Treatment Patient Details Name: Sandra Cooper MRN: [id number] DOB: 1929/06/20 Today's Date: 03/15/2019    History of Present Illness Patient is an 85 year old female admitted with hypoxia. Had lung biopsy and since then has had hypoxia. PMH to inlcude: asthma, HTN, lung nodule.    PT Comments    Patient ready to walk. Daughter present. Patient demonstrates much improved mobility and activity tolerance since earlier in the week. She performed sit to stand with supervision/min guard. Ambulated 200 feet with RW and min guard/supervision. Talking throughout session. She took a few brief standing rest breaks during ambulation. O2 sats remained >89% throughout gait. Reports she is feeling better. Answered daughter's questions regarding progression, home DME needs.    Follow Up Recommendations  Supervision for mobility/OOB;Home health PT;Supervision/Assistance - 24 hour     Equipment Recommendations  3in1 (PT)    Recommendations for Other Services       Precautions / Restrictions Precautions Precautions: Fall Precaution Comments: h/o falls Restrictions Weight Bearing Restrictions: No    Mobility  Bed Mobility               General bed mobility comments: patient seated on edge of bed upon arrival  Transfers Overall transfer level: Needs assistance Equipment used: Rolling walker (2 wheeled) Transfers: Sit to/from Stand Sit to Stand: Supervision            Ambulation/Gait Ambulation/Gait assistance: Supervision Gait Distance (Feet): 200 Feet Assistive device: Rolling walker (2 wheeled) Gait Pattern/deviations: Step-through pattern Gait velocity: decreased   General Gait Details: Increased activity tolerance this day. O2 sats down to 89% while ambulating, with rest quickly retunred to >90%, several brief standing rest breaks due to fatigue.   Stairs             Wheelchair Mobility    Modified Rankin (Stroke Patients Only)       Balance  Overall balance assessment: Needs assistance Sitting-balance support: Feet supported Sitting balance-Leahy Scale: Good     Standing balance support: Bilateral upper extremity supported;During functional activity Standing balance-Leahy Scale: Good                              Cognition Arousal/Alertness: Awake/alert Behavior During Therapy: WFL for tasks assessed/performed Overall Cognitive Status: Within Functional Limits for tasks assessed                                        Exercises      General Comments General comments (skin integrity, edema, etc.): patient's daughter present and has many questions about O2, home exercise. Educated that patient does not qualify for home O2 at this time, and that HHPT will assist patient with progressing activity appropriately, suggested patient get 3in1 and rollator for home use.      Pertinent Vitals/Pain Pain Assessment: No/denies pain    Home Living Family/patient expects to be discharged to:: Private residence Living Arrangements: Alone Available Help at Discharge: Family;Available PRN/intermittently Type of Home: House Home Access: Stairs to enter   Home Layout: One level        Prior Function            PT Goals (current goals can now be found in the care plan section) Acute Rehab PT Goals Patient Stated Goal: to go back home PT Goal Formulation: With patient/family Time For Goal Achievement:  03/18/19 Potential to Achieve Goals: Good Progress towards PT goals: Progressing toward goals    Frequency    Min 3X/week      PT Plan Current plan remains appropriate    Co-evaluation              AM-PAC PT "6 Clicks" Mobility   Outcome Measure  Help needed turning from your back to your side while in a flat bed without using bedrails?: None Help needed moving from lying on your back to sitting on the side of a flat bed without using bedrails?: None Help needed moving to and from a  bed to a chair (including a wheelchair)?: A Little Help needed standing up from a chair using your arms (e.g., wheelchair or bedside chair)?: A Little Help needed to walk in hospital room?: A Little Help needed climbing 3-5 steps with a railing? : A Little 6 Click Score: 20    End of Session Equipment Utilized During Treatment: Gait belt Activity Tolerance: Patient tolerated treatment well;Patient limited by fatigue Patient left: in bed;with call bell/phone within reach;with family/visitor present Nurse Communication: Mobility status PT Visit Diagnosis: Muscle weakness (generalized) (M62.81);Difficulty in walking, not elsewhere classified (R26.2)     Time: 8366-2947 PT Time Calculation (min) (ACUTE ONLY): 30 min  Charges:  $Gait Training: 23-37 mins                     Beatris Belen, PT, GCS 03/15/19,12:30 PM

## 2019-03-15 NOTE — Progress Notes (Signed)
Chaplain stopped by to check on Sandra Cooper and to offer Sandra Cooper a rosery. Sandra Cooper's daughter requested the Chaplain sit down and offer further explanation of AD paperwork. Chaplain sat down and further explained paperwork. Daughter made request for Palliative Care doctor to come to Sandra Cooper's room and explain the technicalities of resuscitations, life saving measures, and how the logistics of a DNR works in the medical system. Chaplain remains available per request.   Chaplain Resident, Evelene Croon, M Div Pager # [phone] Pager # [phone]

## 2019-03-15 NOTE — Plan of Care (Signed)
I spoke to her son Sandra Cooper and daughter over the phone and answered their questions regarding her COPD treatment chronically- she should be discharged on ICS/LABA inhaler with albuterol PRN and complete her course of azithromycin and prednisone if she has not done so already. She will be discussed in tumor board. Please schedule pulmonary follow up after discharge.  Julian Hy, DO 03/15/19 9:42 AM [facility]

## 2019-03-15 NOTE — Discharge Summary (Addendum)
Physician Discharge Summary  Erine Phenix XBD:[id number] DOB: Apr 13, 1930 DOA: 03/09/2019  PCP: [name], MD  Admit date: 03/09/2019 Discharge date: 03/15/2019  Time spent: >25 minutes  Recommendations for Outpatient Follow-up:  Oncology on 03/16/19 Pulmonology in 7-10 days PCP in 3-4 days   Discharge Diagnoses:  Active Problems:   Dyspnea   Discharge Condition: stab;e   Diet recommendation: low sodium   Filed Weights   03/13/19 0429  Weight: 48.8 kg    History of present illness:    85 year old female with history of asthma, hypertension and lung nodule status post lung biopsy by IR on 03/09/2019 and subsequently developed hemoptysis and increased oxygen requirement and had to be subsequently admitted under our service. Hospitalist service was consulted for hypoxemia. Underwent lung biopsy. Pathology + adenocarcinoma   Hospital Course:   Acute hypoxic respiratory failure: resolved. multifactorial: s/p IR guided lung biopsy on 03/09/2019 for a lung nodule. + asthma withPFTs with mild obtsruction .  - Per pulmonology: cont bronchodilators prn,. Solu-Medrol has been switched to oral prednisone 40 mg daily, and azithromycin to complete of 5 days treatment course  New diagnosis of left lung adenocarcinoma.  -Lung nodule biopsy pathology positive for adenocarcinoma. Dr. Kipp Brood spoke to the family regarding the same already. Plan to discuss on multidisciplinary meetign as outpatient  -f/u appointment is scheduled at oncology clinic on 10/15.  Dr. Gorsuch/oncology. Encouraged patient/family to f/u at discharge  -pend covid test results. Asymptomatic   Acute blood loss anemia. Hemoptysis. S/p biopsy. Hg is stable.   Hypertension  -Monitor blood pressure. Continue amlodipine and losartan.  Generalized deconditioning. PT recommending SNF placement. Family declined. They want to go home with [facility]. Social worker consulted.   Procedures:  Lung biopsy  (i.e. Studies  not automatically included, echos, thoracentesis, etc; not x-rays)  Consultations:  Oncology  pulmonology  Ct surgery   Discharge Exam: Vitals:   03/14/19 1315 03/14/19 2144  BP: 117/66   Pulse: 86   Resp: 18   Temp: 98 F (36.7 C)   SpO2: 93% 92%    General: no distress  Cardiovascular: s1,s2 rrr Respiratory: no wheezing   Discharge Instructions  Discharge Instructions    Ambulatory referral to Pulmonology   Complete by: As directed    Diet - low sodium heart healthy   Complete by: As directed    Discharge instructions   Complete by: As directed    Please follow up with oncology on 03/16/19 Please follow up with pulmonology in 7-10 days Please follow up with primary care doctor in 3-4 days as needed   Increase activity slowly   Complete by: As directed      Allergies as of 03/15/2019      Reactions   Fluoxetine Swelling      Medication List    STOP taking these medications   aspirin EC 81 MG tablet   ibuprofen 200 MG tablet Commonly known as: ADVIL     TAKE these medications   acetaminophen 325 MG tablet Commonly known as: TYLENOL Take 2 tablets (650 mg total) by mouth every 6 (six) hours as needed for mild pain (or Fever >/= 101).   albuterol 108 (90 Base) MCG/ACT inhaler Commonly known as: VENTOLIN HFA Inhale 2 puffs into the lungs every 6 (six) hours as needed for wheezing or shortness of breath.   alum & mag hydroxide-simeth 200-200-20 MG/5ML suspension Commonly known as: MAALOX/MYLANTA Take 15 mLs by mouth every 6 (six) hours as needed for indigestion or heartburn.  amLODipine 5 MG tablet Commonly known as: NORVASC Take 1 tablet (5 mg total) by mouth daily. What changed: Another medication with the same name was removed. Continue taking this medication, and follow the directions you see here.   azithromycin 250 MG tablet Commonly known as: ZITHROMAX Take 1 tablet (250 mg total) by mouth daily. Start taking on: March 16, 2019    Calcium-Vitamin D 600-400 MG-UNIT Tabs Take 1 tablet by mouth daily.   EYE HEALTH FORMULA PO Take 1 tablet by mouth daily.   losartan 100 MG tablet Commonly known as: COZAAR Take 100 mg by mouth every evening.   magnesium gluconate 500 MG tablet Commonly known as: MAGONATE Take 1,000 mg by mouth daily.   mirtazapine 15 MG tablet Commonly known as: REMERON Take 15 mg by mouth at bedtime.   mometasone-formoterol 200-5 MCG/ACT Aero Commonly known as: DULERA Inhale 2 puffs into the lungs 2 (two) times daily.   multivitamin tablet Take 1 tablet by mouth daily.   Omega-3-6-9 Caps Take 1 capsule by mouth 2 (two) times daily.   OVER THE COUNTER MEDICATION Take 1 tablet by mouth daily. Life Extension, Brain Support Supplement.   Potassium 99 MG Tabs Take 99 mg by mouth daily.   predniSONE 20 MG tablet Commonly known as: DELTASONE Take 2 tablets (40 mg total) by mouth daily with breakfast. Start taking on: March 16, 2019   Salonpas 3.06-06-08 % Ptch Generic drug: Camphor-Menthol-Methyl Sal Place 1 patch onto the skin daily as needed (pain).   vitamin B-12 500 MCG tablet Commonly known as: CYANOCOBALAMIN Take 500 mcg by mouth daily.   Vitamin D 50 MCG (2000 UT) Caps Take 2,000 Units by mouth daily.            Durable Medical Equipment  (From admission, onward)         Start     Ordered   03/15/19 1253  For home use only DME 4 wheeled rolling walker with seat  Once    Question:  Patient needs a walker to treat with the following condition  Answer:  Weakness   03/15/19 1252   03/15/19 1212  For home use only DME 3 n 1  Once     03/15/19 1211   03/15/19 1152  For home use only DME oxygen  Once    Question Answer Comment  Length of Need 6 Months   Mode or (Route) Nasal cannula   Liters per Minute 2   Frequency Only at night (stationary unit needed)   Oxygen delivery system Gas      03/15/19 1153         Allergies  Allergen Reactions  . Fluoxetine  Swelling   Contact information for after-discharge care    Destination    [facility] Preferred SNF .   Service: Skilled Nursing Contact information: [address] [place] [phone]               The results of significant diagnostics from this hospitalization (including imaging, microbiology, ancillary and laboratory) are listed below for reference.    Significant Diagnostic Studies: Dg Chest 2 View  Result Date: 03/11/2019 CLINICAL DATA:  Hypoxemia. Status post biopsy of a left lower lobe pulmonary nodule 03/09/2019. EXAM: CHEST - 2 VIEW COMPARISON:  Single-view of the chest 03/11/2019. PA and lateral chest 03/10/2019. CT chest and single-view of the chest 01/31/2019. FINDINGS: Patchy opacity in the left lung base is compatible with atelectasis and possibly a small  amount of hemorrhage related to biopsy. Lungs are otherwise clear. No pneumothorax. Trace left pleural effusion noted. The chest is hyperexpanded. Heart size is normal. No acute bony abnormality. IMPRESSION: No change in patchy left basilar airspace disease likely atelectasis and/or hemorrhage after biopsy. Negative for pneumothorax after biopsy. Atherosclerosis. Electronically Signed   By: Inge Rise M.D.   On: 03/11/2019 13:05   Dg Chest 2 View  Result Date: 03/10/2019 CLINICAL DATA:  Hemoptysis EXAM: CHEST - 2 VIEW COMPARISON:  None. FINDINGS: The heart size is normal. Vascular calcifications are seen in the aortic arch. An airspace opacity in the left lung base posteriorly likely represents the patient's previously biopsied pulmonary nodule with surrounding post biopsy hemorrhage. The right lung is clear. There is a tiny left pleural effusion. There is no pneumothorax. The visualized skeletal structures are unremarkable. IMPRESSION: 1. Tiny left pleural effusion. No pneumothorax. 2. Left basilar airspace opacity, likely the patient's previously biopsied pulmonary nodule  with surrounding post biopsy hemorrhage. Aortic Atherosclerosis (ICD10-I70.0). Electronically Signed   By: Zerita Boers M.D.   On: 03/10/2019 12:58   Dg Chest 2 View  Result Date: 03/10/2019 CLINICAL DATA:  Shortness of breath. Hemoptysis. Recent left lung biopsy. EXAM: CHEST - 2 VIEW COMPARISON:  Multiple chest x-rays dated 03/09/2019 and CT scan dated 01/31/2019 FINDINGS: The heart size and pulmonary vascularity are normal. The haziness at the left lung base has improved consistent with resolving post biopsy hemorrhage. Minimal blunting of the left costophrenic angle consistent with a tiny effusion. Right lung is clear. Both lungs are hyperinflated. Aortic atherosclerosis. No acute bone abnormality. Old compression fracture in the upper thoracic spine. IMPRESSION: 1. No acute abnormalities.  No pneumothorax. 2. Tiny left pleural effusion. 3. Improved haziness at the left lung base consistent with resolving post biopsy hemorrhage. 4. Aortic atherosclerosis. Electronically Signed   By: Lorriane Shire M.D.   On: 03/10/2019 10:47   Nm Pet Image Initial (pi) Skull Base To Thigh  Result Date: 02/15/2019 CLINICAL DATA:  Initial treatment strategy for left lower lobe nodule. EXAM: NUCLEAR MEDICINE PET SKULL BASE TO THIGH TECHNIQUE: 5.4 mCi F-18 FDG was injected intravenously. Full-ring PET imaging was performed from the skull base to thigh after the radiotracer. CT data was obtained and used for attenuation correction and anatomic localization. Fasting blood glucose: 80 mg/dl COMPARISON:  CTA chest 01/31/2019 FINDINGS: Mediastinal blood pool activity: SUV max 2.3 Liver activity: SUV max NA NECK: No areas of abnormal hypermetabolism. Incidental CT findings: No cervical adenopathy. Bilateral carotid atherosclerosis. CHEST: Left axillary hypermetabolism including at a S.U.V. max of 2.2, corresponding to tiny nodes on 49/4. There is also low-level hypermetabolism within both hila, without adenopathy. The irregular,  partially cavitary lateral left lower lobe pulmonary nodule measures 1.8 x 1.5 cm and a S.U.V. max of 2.3 on 45/8. Compare 2.1 x 1.7 cm on the prior exam. Morphologically appears similar. Incidental CT findings: Mild centrilobular emphysema. A 3 mm lingular nodule is unchanged on 45/8. Aortic and coronary artery atherosclerosis. ABDOMEN/PELVIS: No abdominopelvic parenchymal or nodal hypermetabolism. Incidental CT findings: Normal adrenal glands. Well-circumscribed low-density liver lesions are likely cysts. Abdominal aortic atherosclerosis. Large colonic stool burden. Pelvic floor laxity. SKELETON: No abnormal marrow activity. Incidental CT findings: Osteopenia. IMPRESSION: 1. Low-level, indeterminate hypermetabolism corresponding to the left lower lobe pulmonary nodule. Although this appears morphologically similar, it measures slightly smaller today. Especially if the patient has infectious symptoms, consider antibiotic therapy and CT follow-up at 6-12 weeks. If no infectious symptoms, potential clinical  strategies include tissue sampling versus CT follow-up at 3 months. 2. Thoracic nodal hypermetabolism, favored to be reactive. 3. Aortic atherosclerosis (ICD10-I70.0), coronary artery atherosclerosis and emphysema (ICD10-J43.9). Electronically Signed   By: Abigail Miyamoto M.D.   On: 02/15/2019 13:05   Ct Biopsy  Result Date: 03/09/2019 CLINICAL DATA:  Hypermetabolic left lower lobe lung nodule EXAM: CT GUIDED CORE BIOPSY OF LEFT LOWER LOBE LUNG NODULE ANESTHESIA/SEDATION: Intravenous Fentanyl 90mcg and Versed 1mg  were administered as conscious sedation during continuous monitoring of the patient's level of consciousness and physiological / cardiorespiratory status by the radiology RN, with a total moderate sedation time of 14 minutes. PROCEDURE: The procedure risks, benefits, and alternatives were explained to the patient. Questions regarding the procedure were encouraged and answered. The patient understands and  consents to the procedure. Patient placed supine. Select axial scans through the thorax were obtained. The left lower lobe lesion was localized. It was unchanged in size from previous PET-CT. An appropriate skin entry site was determined and marked. The operative field was prepped with chlorhexidinein a sterile fashion, and a sterile drape was applied covering the operative field. A sterile gown and sterile gloves were used for the procedure. Local anesthesia was provided with 1% Lidocaine. Under CT fluoroscopic guidance, a 17 gauge trocar needle was advanced to the margin of the lesion. Once needle tip position was confirmed, coaxial 18-gauge core biopsy samples were obtained, submitted in formalin to surgical pathology. The guide needle was removed. Postprocedure scans show no pneumothorax. Small amount of peripheral alveolar hemorrhage. COMPLICATIONS: Self-limited hemoptysis and mild hypoxia which responded to supplemental O2. SIR level B: Nominal therapy (including overnight admission for observation), no consequence. FINDINGS: Left lower lobe nodule was similar in appearance to prior PET-CT. Representative core biopsy samples obtained as above. Patient had mild self limited hemoptysis postprocedure with mild hypoxia which responded to supplemental O2. She remained otherwise stable and will be kept for overnight observation , and follow-up chest radiography. IMPRESSION: 1. Technically successful CT-guided core biopsy, left lower lobe lung nodule. 2. Extended observation secondary to mild self-limited hemoptysis and hypoxia which responded to supplemental O2. Results were called by telephone at the time of interpretation on 03/09/2019 at 3:02 pm to providerHARRELL LIGHTFOOT , who verbally acknowledged these results. Electronically Signed   By: Lucrezia Europe M.D.   On: 03/09/2019 15:04   Dg Chest Port 1 View  Result Date: 03/11/2019 CLINICAL DATA:  Hypoxemia. EXAM: PORTABLE CHEST 1 VIEW COMPARISON:  One day prior  FINDINGS: Osteopenia. Hyperinflation. Midline trachea. Mild cardiomegaly. Atherosclerosis in the transverse aorta. No pleural effusion or pneumothorax. Persistent patchy left base opacity. IMPRESSION: No significant change in hyperinflation with mild left base airspace disease, likely atelectasis and/or hemorrhage after biopsy. Aortic Atherosclerosis (ICD10-I70.0). Electronically Signed   By: Abigail Miyamoto M.D.   On: 03/11/2019 10:19   Dg Chest Port 1 View  Result Date: 03/09/2019 CLINICAL DATA:  Hemoptysis after lung biopsy. EXAM: PORTABLE CHEST 1 VIEW COMPARISON:  Chest x-rays and CT biopsy from same day. FINDINGS: The heart size and mediastinal contours are within normal limits. Atherosclerotic calcification of the aortic arch. Normal pulmonary vascularity. Unchanged patchy airspace disease at the left lung base. No pneumothorax or pleural effusion. No acute osseous abnormality. IMPRESSION: 1. No pneumothorax. The suspected pleural line on the prior x-ray represented a skin fold as it extended beyond the margins of the lung. 2. Unchanged post biopsy hemorrhage in the left lower lobe. Electronically Signed   By: Orville Govern.D.  On: 03/09/2019 20:06   Dg Chest Port 1 View  Result Date: 03/09/2019 CLINICAL DATA:  Hemoptysis post left lung needle biopsy EXAM: PORTABLE CHEST 1 VIEW COMPARISON:  03/09/2019 FINDINGS: Progression of left lower lobe airspace disease most likely post biopsy hemorrhage. Interval development of left pneumothorax approximately 2.5 mm. Minimal left effusion Right lung clear.  Atherosclerotic aortic arch. IMPRESSION: Progression of left lower lobe airspace disease compatible with hemorrhage post biopsy. Interval development of moderate left pneumothorax. These results will be called to the ordering clinician or representative by the Radiologist Assistant, and communication documented in the PACS or zVision Dashboard. Electronically Signed   By: Franchot Gallo M.D.   On: 03/09/2019  15:25   Dg Chest Port 1 View  Result Date: 03/09/2019 CLINICAL DATA:  Hemoptysis.  Lung biopsy. EXAM: PORTABLE CHEST 1 VIEW COMPARISON:  January 31, 2019. FINDINGS: Stable cardiomediastinal silhouette. Atherosclerosis of thoracic aorta is noted. Stable right basilar scarring is noted. Slightly increased left basilar opacity is noted which may represent pulmonary hemorrhage secondary to biopsy. Bony thorax is unremarkable. No pneumothorax or significant effusion is noted. IMPRESSION: No pneumothorax status post left lung biopsy. Slightly increased left basilar density is noted which may represent pulmonary hemorrhage secondary to biopsy. Aortic Atherosclerosis (ICD10-I70.0). Electronically Signed   By: Marijo Conception M.D.   On: 03/09/2019 12:54   US Thyroid  Result Date: 02/15/2019 CLINICAL DATA:  85 year old female with a thyroid nodule EXAM: THYROID ULTRASOUND TECHNIQUE: Ultrasound examination of the thyroid gland and adjacent soft tissues was performed. COMPARISON:  None. FINDINGS: Parenchymal Echotexture: Normal Isthmus: 3.4 cm Right lobe: 3.6 cm x 1.4 cm x 1.1 cm Left lobe: 4.2 cm x 1.8 cm x 1.7 cm _________________________________________________________ Estimated total number of nodules >/= 1 cm: 1 Number of spongiform nodules >/=  2 cm not described below (TR1): 0 Number of mixed cystic and solid nodules >/= 1.5 cm not described below (TR2): 0 _________________________________________________________ Nodule # 1: Location: Left; Inferior Maximum size: 2.6 cm; Other 2 dimensions: 2.1 cm x 2.1 cm Composition: mixed cystic and solid (1) Echogenicity: isoechoic (1) Shape: taller-than-wide (3) Margins: smooth (0) Echogenic foci: none (0) ACR TI-RADS total points: 5. ACR TI-RADS risk category: TR4 (4-6 points). ACR TI-RADS recommendations: Nodule meets criteria for biopsy _________________________________________________________ No adenopathy IMPRESSION: Left inferior thyroid nodule (labeled 1, TR 4) meets  criteria for biopsy, as designated by the newly established ACR TI-RADS criteria, and referral for biopsy is recommended. Recommendations follow those established by the new ACR TI-RADS criteria (J Am Coll Radiol 0354;65:681-275). Electronically Signed   By: Corrie Mckusick D.O.   On: 02/15/2019 09:31    Microbiology: Recent Results (from the past 240 hour(s))  Novel Coronavirus, NAA (Hosp order, Send-out to Ref Lab; TAT 18-24 hrs     Status: None   Collection Time: 03/06/19  9:54 AM   Specimen: Nasopharyngeal Swab; Respiratory  Result Value Ref Range Status   SARS-CoV-2, NAA NOT DETECTED NOT DETECTED Final    Comment: (NOTE) This nucleic acid amplification test was developed and its performance characteristics determined by [employer]. Nucleic acid amplification tests include PCR and TMA. This test has not been FDA cleared or approved. This test has been authorized by FDA under an Emergency Use Authorization (EUA). This test is only authorized for the duration of time the declaration that circumstances exist justifying the authorization of the emergency use of in vitro diagnostic tests for detection of SARS-CoV-2 virus and/or diagnosis of COVID-19 infection under section 564(b)(1)  of the Act, 21 U.S.C. 284XLK-4(M) (1), unless the authorization is terminated or revoked sooner. When diagnostic testing is negative, the possibility of a false negative result should be considered in the context of a patient's recent exposures and the presence of clinical signs and symptoms consistent with COVID-19. An individual without symptoms of COVID- 19 and who is not shedding SARS-CoV-2 vi rus would expect to have a negative (not detected) result in this assay. Performed At: [facility] [address] [place], [place] [id number] Rush Farmer MD UY:[id number]    [place]  Final    Comment: Performed at [facility], [place] [address] [place],  [place] [address]     Labs: Basic Metabolic Panel: Recent Labs  Lab 03/11/19 0158 03/12/19 0257 03/13/19 0305 03/14/19 0300  NA 141 138 138 138  K 3.8 3.2* 4.8 4.4  CL 110 101 107 104  CO2 25 23 25 24   GLUCOSE 98 187* 110* 108*  BUN 14 19 23  25*  CREATININE 0.67 0.77 0.74 0.76  CALCIUM 9.0 9.5 9.3 9.2  MG  --  1.9 2.2 2.2   Liver Function Tests: No results for input(s): AST, ALT, ALKPHOS, BILITOT, PROT, ALBUMIN in the last 168 hours. No results for input(s): LIPASE, AMYLASE in the last 168 hours. No results for input(s): AMMONIA in the last 168 hours. CBC: Recent Labs  Lab 03/09/19 0929 03/10/19 1059 03/11/19 0158 03/12/19 0257 03/13/19 0305 03/14/19 0300  WBC 5.6  --  7.3 5.7 12.5* 9.9  NEUTROABS  --   --  4.6 5.1 9.7* 7.0  HGB 15.2* 11.5* 11.1* 12.7 11.5* 11.6*  HCT 46.6* 35.1* 33.9* 39.0 35.1* 34.6*  MCV 89.3  --  89.0 88.6 89.3 88.3  PLT 336  --  241 278 271 274   Cardiac Enzymes: No results for input(s): CKTOTAL, CKMB, CKMBINDEX, TROPONINI in the last 168 hours. BNP: BNP (last 3 results) Recent Labs    01/31/19 1222  BNP 42.7    ProBNP (last 3 results) No results for input(s): PROBNP in the last 8760 hours.  CBG: No results for input(s): GLUCAP in the last 168 hours.     SignedKinnie Feil  [facility] 03/15/2019, 12:09 PM

## 2019-03-16 ENCOUNTER — Encounter: Payer: Self-pay | Admitting: *Deleted

## 2019-03-16 ENCOUNTER — Ambulatory Visit
Admission: RE | Admit: 2019-03-16 | Discharge: 2019-03-16 | Disposition: A | Discharge status: Home / Self Care | Payer: Medicare HMO | Source: Ambulatory Visit | Attending: Radiation Oncology | Admitting: Radiation Oncology

## 2019-03-16 ENCOUNTER — Other Ambulatory Visit: Payer: Self-pay | Admitting: *Deleted

## 2019-03-16 ENCOUNTER — Other Ambulatory Visit: Payer: Self-pay

## 2019-03-16 DIAGNOSIS — C3432 Malignant neoplasm of lower lobe, left bronchus or lung: Secondary | ICD-10-CM | POA: Insufficient documentation

## 2019-03-16 NOTE — Progress Notes (Signed)
The proposed treatment discussed in cancer conference 03/16/19 is for discussion purpose only and not a binding recommendation.  The patient was not physically examined nor present for their treatment options. Therefore, final treatment plans cannot be decided.

## 2019-03-16 NOTE — Progress Notes (Signed)
Radiation Oncology         ([phone]) [phone] ________________________________  Multidisciplinary Thoracic Oncology Clinic [facility]) Initial Outpatient Consultation  Name: Sandra Cooper MRN: [id number]  Date: 03/16/2019  DOB: February 05, 1930  LO:VFIEPP, Rebeca Alert, MD  [name], MD   REFERRING PHYSICIAN: [name], MD  DIAGNOSIS: Stage IA2 (cT1b, cN0, cM0) adenocarcinoma of the lower lobe of the left  lung    ICD-10-CM   1. Primary cancer of left lower lobe of lung ([place])  C34.32     HISTORY OF PRESENT ILLNESS::Sandra Cooper is a 85 y.o. female who was referred to the ED by her PCP on 01/31/2019 with increasing shortness of breath for the last week. Chest x-ray was abnormal, prompting further imaging. An angio chest CT was performed and showed: ill-defined area of abnormal density in the lateral aspect of the LLL with suggestion of slight spiculation; 2.6 cm nodule in the left lobe of the thyroid gland.  She was referred for surgical evaluation on 02/03/2019 to Dr. Kipp Brood, who recommended PET scan, brain MRI, biopsy (CT-guided vs bronchoscopy vs surgical). PET scan performed on 02/15/2019 revealed: low-level, indeterminate hypermetabolism corresponding to the LLL pulmonary nodule, measures slightly smaller; thoracic nodal hypermetabolism, favored to be reactive.  She proceeded to CT-guided biopsy on 03/09/2019, which revealed: lung adenocarcinoma with lipidic and acinar patterns. She experienced hemoptysis following the procedure and was admitted for observation. She was discharged earlier this week but continues to have significant fatigue.  She has minimal hemoptysis at this time.  Continues to have some shortness of breath since discharge from the hospital and dyspnea on exertion.  she denies any new bony pain headaches dizziness or blurred vision  The patient was referred today for presentation in the multidisciplinary conference.  Radiology studies and pathology slides were presented there for  review and discussion of treatment options.  A consensus was discussed regarding potential next steps.  PREVIOUS RADIATION THERAPY: No  PAST MEDICAL HISTORY:  has a past medical history of Arthritis, Asthma, and Hypertension.    PAST SURGICAL HISTORY: Past Surgical History:  Procedure Laterality Date   APPENDECTOMY     HERNIA REPAIR      FAMILY HISTORY: family history is not on file.  SOCIAL HISTORY:  reports that she has been smoking cigarettes. She has never used smokeless tobacco. She reports that she does not drink alcohol or use drugs.  She reports secondhand smoke her husband.  ALLERGIES: Fluoxetine  MEDICATIONS:  Current Outpatient Medications  Medication Sig Dispense Refill   acetaminophen (TYLENOL) 325 MG tablet Take 2 tablets (650 mg total) by mouth every 6 (six) hours as needed for mild pain (or Fever >/= 101).     albuterol (VENTOLIN HFA) 108 (90 Base) MCG/ACT inhaler Inhale 2 puffs into the lungs every 6 (six) hours as needed for wheezing or shortness of breath. 6.7 g 2   alum & mag hydroxide-simeth (MAALOX/MYLANTA) 200-200-20 MG/5ML suspension Take 15 mLs by mouth every 6 (six) hours as needed for indigestion or heartburn. 355 mL 0   amLODipine (NORVASC) 5 MG tablet Take 1 tablet (5 mg total) by mouth daily. 30 tablet 0   azithromycin (ZITHROMAX) 250 MG tablet Take 1 tablet (250 mg total) by mouth daily. 3 tablet 0   Calcium Carb-Cholecalciferol (CALCIUM-VITAMIN D) 600-400 MG-UNIT TABS Take 1 tablet by mouth daily.     Camphor-Menthol-Methyl Sal (SALONPAS) 3.06-06-08 % PTCH Place 1 patch onto the skin daily as needed (pain).     Cholecalciferol (VITAMIN  D) 50 MCG (2000 UT) CAPS Take 2,000 Units by mouth daily.     DHA-Vitamin C-Lutein (EYE HEALTH FORMULA PO) Take 1 tablet by mouth daily.     losartan (COZAAR) 100 MG tablet Take 100 mg by mouth every evening.     magnesium gluconate (MAGONATE) 500 MG tablet Take 1,000 mg by mouth daily.      mirtazapine  (REMERON) 15 MG tablet Take 15 mg by mouth at bedtime.     mometasone-formoterol (DULERA) 200-5 MCG/ACT AERO Inhale 2 puffs into the lungs 2 (two) times daily. 8.8 g 2   Multiple Vitamin (MULTIVITAMIN) tablet Take 1 tablet by mouth daily.     Omega-3-6-9 CAPS Take 1 capsule by mouth 2 (two) times daily.      OVER THE COUNTER MEDICATION Take 1 tablet by mouth daily. Life Extension, Brain Support Supplement.     Potassium 99 MG TABS Take 99 mg by mouth daily.     predniSONE (DELTASONE) 20 MG tablet Take 2 tablets (40 mg total) by mouth daily with breakfast. 3 tablet 0   vitamin B-12 (CYANOCOBALAMIN) 500 MCG tablet Take 500 mcg by mouth daily.     No current facility-administered medications for this encounter.     REVIEW OF SYSTEMS:  REVIEW OF SYSTEMS: A 10+ POINT REVIEW OF SYSTEMS WAS OBTAINED including neurology, dermatology, psychiatry, cardiac, respiratory, lymph, extremities, GI, GU, musculoskeletal, constitutional, reproductive, HEENT. All pertinent positives are noted in the HPI. All others are negative.   PHYSICAL EXAM:  weight is 106 lb 3.2 oz (48.2 kg). Her temperature is 98.9 F (37.2 C). Her blood pressure is 112/62 and her pulse is 84. Her respiration is 18 and oxygen saturation is 93%.   General: Alert and oriented, in no acute distress, company by daughter on evaluation today.  Her son was also present by phone contact during evaluation HEENT: Head is normocephalic. Extraocular movements are intact. Oropharynx is clear. Neck: Neck is supple, no palpable cervical or supraclavicular lymphadenopathy. Heart: Regular in rate and rhythm with no murmurs, rubs, or gallops. Chest: Clear to auscultation bilaterally, with no rhonchi, wheezes, or rales. Abdomen: Soft, nontender, nondistended, with no rigidity or guarding. Extremities: No cyanosis or edema. Lymphatics: see Neck Exam Skin: No concerning lesions. Musculoskeletal: symmetric strength and muscle tone  throughout. Neurologic: Cranial nerves II through XII are grossly intact. No obvious focalities. Speech is fluent. Coordination is intact. Psychiatric: Judgment and insight are intact. Affect is appropriate. Remain in the wheelchair for evaluation  KPS = 70  100 - Normal; no complaints; no evidence of disease. 90   - Able to carry on normal activity; minor signs or symptoms of disease. 80   - Normal activity with effort; some signs or symptoms of disease. 17   - Cares for self; unable to carry on normal activity or to do active work. 60   - Requires occasional assistance, but is able to care for most of his personal needs. 50   - Requires considerable assistance and frequent medical care. 69   - Disabled; requires special care and assistance. 34   - Severely disabled; hospital admission is indicated although death not imminent. 78   - Very sick; hospital admission necessary; active supportive treatment necessary. 10   - Moribund; fatal processes progressing rapidly. 0     - Dead  Karnofsky DA, Abelmann WH, Craver LS and Burchenal [facility] [phone]) The use of the nitrogen mustards in the palliative treatment of carcinoma: with particular reference to bronchogenic carcinoma  Cancer 1 [id number]  LABORATORY DATA:  Lab Results  Component Value Date   WBC 9.9 03/14/2019   HGB 11.6 (L) 03/14/2019   HCT 34.6 (L) 03/14/2019   MCV 88.3 03/14/2019   PLT 274 03/14/2019   Lab Results  Component Value Date   NA 138 03/14/2019   K 4.4 03/14/2019   CL 104 03/14/2019   CO2 24 03/14/2019   Lab Results  Component Value Date   ALT 20 01/31/2019   AST 21 01/31/2019   ALKPHOS 73 01/31/2019   BILITOT 0.9 01/31/2019    PULMONARY FUNCTION TEST:   Recent Review Flowsheet Data    Spirometry Latest Ref Rng & Units 02/16/2019   FVC-%PRED-PRE % 97   FVC-%PRED-POST % 99   FEV1-PRE L 1.17   FEV1-%PRED-PRE % 92   FEV1-POST L 1.30   FEV1-%PRED-POST % 102   DLCO [facility] ml/min/mmHg 12.23      RADIOGRAPHY: Dg  Chest 2 View  Result Date: 03/11/2019 CLINICAL DATA:  Hypoxemia. Status post biopsy of a left lower lobe pulmonary nodule 03/09/2019. EXAM: CHEST - 2 VIEW COMPARISON:  Single-view of the chest 03/11/2019. PA and lateral chest 03/10/2019. CT chest and single-view of the chest 01/31/2019. FINDINGS: Patchy opacity in the left lung base is compatible with atelectasis and possibly a small amount of hemorrhage related to biopsy. Lungs are otherwise clear. No pneumothorax. Trace left pleural effusion noted. The chest is hyperexpanded. Heart size is normal. No acute bony abnormality. IMPRESSION: No change in patchy left basilar airspace disease likely atelectasis and/or hemorrhage after biopsy. Negative for pneumothorax after biopsy. Atherosclerosis. Electronically Signed   By: Inge Rise M.D.   On: 03/11/2019 13:05   Dg Chest 2 View  Result Date: 03/10/2019 CLINICAL DATA:  Hemoptysis EXAM: CHEST - 2 VIEW COMPARISON:  None. FINDINGS: The heart size is normal. Vascular calcifications are seen in the aortic arch. An airspace opacity in the left lung base posteriorly likely represents the patient's previously biopsied pulmonary nodule with surrounding post biopsy hemorrhage. The right lung is clear. There is a tiny left pleural effusion. There is no pneumothorax. The visualized skeletal structures are unremarkable. IMPRESSION: 1. Tiny left pleural effusion. No pneumothorax. 2. Left basilar airspace opacity, likely the patient's previously biopsied pulmonary nodule with surrounding post biopsy hemorrhage. Aortic Atherosclerosis (ICD10-I70.0). Electronically Signed   By: Zerita Boers M.D.   On: 03/10/2019 12:58   Dg Chest 2 View  Result Date: 03/10/2019 CLINICAL DATA:  Shortness of breath. Hemoptysis. Recent left lung biopsy. EXAM: CHEST - 2 VIEW COMPARISON:  Multiple chest x-rays dated 03/09/2019 and CT scan dated 01/31/2019 FINDINGS: The heart size and pulmonary vascularity are normal. The haziness at the  left lung base has improved consistent with resolving post biopsy hemorrhage. Minimal blunting of the left costophrenic angle consistent with a tiny effusion. Right lung is clear. Both lungs are hyperinflated. Aortic atherosclerosis. No acute bone abnormality. Old compression fracture in the upper thoracic spine. IMPRESSION: 1. No acute abnormalities.  No pneumothorax. 2. Tiny left pleural effusion. 3. Improved haziness at the left lung base consistent with resolving post biopsy hemorrhage. 4. Aortic atherosclerosis. Electronically Signed   By: Lorriane Shire M.D.   On: 03/10/2019 10:47   Nm Pet Image Initial (pi) Skull Base To Thigh  Result Date: 02/15/2019 CLINICAL DATA:  Initial treatment strategy for left lower lobe nodule. EXAM: NUCLEAR MEDICINE PET SKULL BASE TO THIGH TECHNIQUE: 5.4 mCi F-18 FDG was injected intravenously. Full-ring PET imaging was performed from  the skull base to thigh after the radiotracer. CT data was obtained and used for attenuation correction and anatomic localization. Fasting blood glucose: 80 mg/dl COMPARISON:  CTA chest 01/31/2019 FINDINGS: Mediastinal blood pool activity: SUV max 2.3 Liver activity: SUV max NA NECK: No areas of abnormal hypermetabolism. Incidental CT findings: No cervical adenopathy. Bilateral carotid atherosclerosis. CHEST: Left axillary hypermetabolism including at a S.U.V. max of 2.2, corresponding to tiny nodes on 49/4. There is also low-level hypermetabolism within both hila, without adenopathy. The irregular, partially cavitary lateral left lower lobe pulmonary nodule measures 1.8 x 1.5 cm and a S.U.V. max of 2.3 on 45/8. Compare 2.1 x 1.7 cm on the prior exam. Morphologically appears similar. Incidental CT findings: Mild centrilobular emphysema. A 3 mm lingular nodule is unchanged on 45/8. Aortic and coronary artery atherosclerosis. ABDOMEN/PELVIS: No abdominopelvic parenchymal or nodal hypermetabolism. Incidental CT findings: Normal adrenal glands.  Well-circumscribed low-density liver lesions are likely cysts. Abdominal aortic atherosclerosis. Large colonic stool burden. Pelvic floor laxity. SKELETON: No abnormal marrow activity. Incidental CT findings: Osteopenia. IMPRESSION: 1. Low-level, indeterminate hypermetabolism corresponding to the left lower lobe pulmonary nodule. Although this appears morphologically similar, it measures slightly smaller today. Especially if the patient has infectious symptoms, consider antibiotic therapy and CT follow-up at 6-12 weeks. If no infectious symptoms, potential clinical strategies include tissue sampling versus CT follow-up at 3 months. 2. Thoracic nodal hypermetabolism, favored to be reactive. 3. Aortic atherosclerosis (ICD10-I70.0), coronary artery atherosclerosis and emphysema (ICD10-J43.9). Electronically Signed   By: Abigail Miyamoto M.D.   On: 02/15/2019 13:05   Ct Biopsy  Result Date: 03/09/2019 CLINICAL DATA:  Hypermetabolic left lower lobe lung nodule EXAM: CT GUIDED CORE BIOPSY OF LEFT LOWER LOBE LUNG NODULE ANESTHESIA/SEDATION: Intravenous Fentanyl 43mcg and Versed 1mg  were administered as conscious sedation during continuous monitoring of the patient's level of consciousness and physiological / cardiorespiratory status by the radiology RN, with a total moderate sedation time of 14 minutes. PROCEDURE: The procedure risks, benefits, and alternatives were explained to the patient. Questions regarding the procedure were encouraged and answered. The patient understands and consents to the procedure. Patient placed supine. Select axial scans through the thorax were obtained. The left lower lobe lesion was localized. It was unchanged in size from previous PET-CT. An appropriate skin entry site was determined and marked. The operative field was prepped with chlorhexidinein a sterile fashion, and a sterile drape was applied covering the operative field. A sterile gown and sterile gloves were used for the procedure.  Local anesthesia was provided with 1% Lidocaine. Under CT fluoroscopic guidance, a 17 gauge trocar needle was advanced to the margin of the lesion. Once needle tip position was confirmed, coaxial 18-gauge core biopsy samples were obtained, submitted in formalin to surgical pathology. The guide needle was removed. Postprocedure scans show no pneumothorax. Small amount of peripheral alveolar hemorrhage. COMPLICATIONS: Self-limited hemoptysis and mild hypoxia which responded to supplemental O2. SIR level B: Nominal therapy (including overnight admission for observation), no consequence. FINDINGS: Left lower lobe nodule was similar in appearance to prior PET-CT. Representative core biopsy samples obtained as above. Patient had mild self limited hemoptysis postprocedure with mild hypoxia which responded to supplemental O2. She remained otherwise stable and will be kept for overnight observation , and follow-up chest radiography. IMPRESSION: 1. Technically successful CT-guided core biopsy, left lower lobe lung nodule. 2. Extended observation secondary to mild self-limited hemoptysis and hypoxia which responded to supplemental O2. Results were called by telephone at the time of interpretation on 03/09/2019  at 3:02 pm to providerHARRELL LIGHTFOOT , who verbally acknowledged these results. Electronically Signed   By: Lucrezia Europe M.D.   On: 03/09/2019 15:04   Dg Chest Port 1 View  Result Date: 03/11/2019 CLINICAL DATA:  Hypoxemia. EXAM: PORTABLE CHEST 1 VIEW COMPARISON:  One day prior FINDINGS: Osteopenia. Hyperinflation. Midline trachea. Mild cardiomegaly. Atherosclerosis in the transverse aorta. No pleural effusion or pneumothorax. Persistent patchy left base opacity. IMPRESSION: No significant change in hyperinflation with mild left base airspace disease, likely atelectasis and/or hemorrhage after biopsy. Aortic Atherosclerosis (ICD10-I70.0). Electronically Signed   By: Abigail Miyamoto M.D.   On: 03/11/2019 10:19   Dg  Chest Port 1 View  Result Date: 03/09/2019 CLINICAL DATA:  Hemoptysis after lung biopsy. EXAM: PORTABLE CHEST 1 VIEW COMPARISON:  Chest x-rays and CT biopsy from same day. FINDINGS: The heart size and mediastinal contours are within normal limits. Atherosclerotic calcification of the aortic arch. Normal pulmonary vascularity. Unchanged patchy airspace disease at the left lung base. No pneumothorax or pleural effusion. No acute osseous abnormality. IMPRESSION: 1. No pneumothorax. The suspected pleural line on the prior x-ray represented a skin fold as it extended beyond the margins of the lung. 2. Unchanged post biopsy hemorrhage in the left lower lobe. Electronically Signed   By: Titus Dubin M.D.   On: 03/09/2019 20:06   Dg Chest Port 1 View  Result Date: 03/09/2019 CLINICAL DATA:  Hemoptysis post left lung needle biopsy EXAM: PORTABLE CHEST 1 VIEW COMPARISON:  03/09/2019 FINDINGS: Progression of left lower lobe airspace disease most likely post biopsy hemorrhage. Interval development of left pneumothorax approximately 2.5 mm. Minimal left effusion Right lung clear.  Atherosclerotic aortic arch. IMPRESSION: Progression of left lower lobe airspace disease compatible with hemorrhage post biopsy. Interval development of moderate left pneumothorax. These results will be called to the ordering clinician or representative by the Radiologist Assistant, and communication documented in the PACS or zVision Dashboard. Electronically Signed   By: Franchot Gallo M.D.   On: 03/09/2019 15:25   Dg Chest Port 1 View  Result Date: 03/09/2019 CLINICAL DATA:  Hemoptysis.  Lung biopsy. EXAM: PORTABLE CHEST 1 VIEW COMPARISON:  January 31, 2019. FINDINGS: Stable cardiomediastinal silhouette. Atherosclerosis of thoracic aorta is noted. Stable right basilar scarring is noted. Slightly increased left basilar opacity is noted which may represent pulmonary hemorrhage secondary to biopsy. Bony thorax is unremarkable. No  pneumothorax or significant effusion is noted. IMPRESSION: No pneumothorax status post left lung biopsy. Slightly increased left basilar density is noted which may represent pulmonary hemorrhage secondary to biopsy. Aortic Atherosclerosis (ICD10-I70.0). Electronically Signed   By: Marijo Conception M.D.   On: 03/09/2019 12:54   US Thyroid  Result Date: 02/15/2019 CLINICAL DATA:  85 year old female with a thyroid nodule EXAM: THYROID ULTRASOUND TECHNIQUE: Ultrasound examination of the thyroid gland and adjacent soft tissues was performed. COMPARISON:  None. FINDINGS: Parenchymal Echotexture: Normal Isthmus: 3.4 cm Right lobe: 3.6 cm x 1.4 cm x 1.1 cm Left lobe: 4.2 cm x 1.8 cm x 1.7 cm _________________________________________________________ Estimated total number of nodules >/= 1 cm: 1 Number of spongiform nodules >/=  2 cm not described below (TR1): 0 Number of mixed cystic and solid nodules >/= 1.5 cm not described below (TR2): 0 _________________________________________________________ Nodule # 1: Location: Left; Inferior Maximum size: 2.6 cm; Other 2 dimensions: 2.1 cm x 2.1 cm Composition: mixed cystic and solid (1) Echogenicity: isoechoic (1) Shape: taller-than-wide (3) Margins: smooth (0) Echogenic foci: none (0) ACR TI-RADS total points:  5. ACR TI-RADS risk category: TR4 (4-6 points). ACR TI-RADS recommendations: Nodule meets criteria for biopsy _________________________________________________________ No adenopathy IMPRESSION: Left inferior thyroid nodule (labeled 1, TR 4) meets criteria for biopsy, as designated by the newly established ACR TI-RADS criteria, and referral for biopsy is recommended. Recommendations follow those established by the new ACR TI-RADS criteria (J Am Coll Radiol 2919;16:606-004). Electronically Signed   By: Corrie Mckusick D.O.   On: 02/15/2019 09:31      IMPRESSION:  Stage IA2 (cT1b, cN0, cM0) adenocarcinoma of the lower lobe of the left  Lung.  Patient has been evaluated by  thoracic surgery and is not felt to be a candidate for surgery given her age of 45 and other medical issues.  Patient would be a good candidate for a definitive course of radiation therapy, in particular stereotactic body radiation therapy.  I discussed the general course of treatment side effects and potential toxicities of this radiation therapy with the patient and her family members.  The patient appears to understand and wishes to proceed with planned course of treatment  PLAN: Patient will return on October 21 for stereotactic body radiation therapy planning with treatments to begin approximately 2 weeks from now.  Anticipate 3 high-dose-rate treatments directed at the left lower lobe pulmonary nodule (54 Gy).   ------------------------------------------------  Blair Promise, PhD, MD  This document serves as a record of services personally performed by Gery Pray, MD. It was created on his behalf by Wilburn Mylar, a trained medical scribe. The creation of this record is based on the scribe's personal observations and the provider's statements to them. This document has been checked and approved by the attending provider.

## 2019-03-16 NOTE — Progress Notes (Signed)
Oncology Nurse Navigator Documentation  Oncology Nurse Navigator Flowsheets 03/16/2019  Navigator Location [facility]  Referral Date to RadOnc/MedOnc 03/16/2019  Navigator Encounter Type Other/Patient's daughter would like Ms. Bounds to be seen with Dr. Benay Spice.  I updated new patient coordinator to call and schedule.   Barriers/Navigation Needs Coordination of Care  Interventions Coordination of Care  Coordination of Care Other  Acuity Level 2-Minimal Needs (1-2 Barriers Identified)  Time Spent with Patient 15

## 2019-03-17 ENCOUNTER — Telehealth: Payer: Self-pay | Admitting: *Deleted

## 2019-03-17 ENCOUNTER — Telehealth: Payer: Self-pay | Admitting: Internal Medicine

## 2019-03-17 NOTE — Telephone Encounter (Signed)
Received a message to schedule Sandra Cooper to see Dr. Julien Nordmann on 10/26 at 2:15pm w/labs at 1:45pm. The appt date and time has been given to the pt's daughter, [name]. She's been made aware to arrive 15 minutes early.

## 2019-03-17 NOTE — Telephone Encounter (Signed)
Oncology Nurse Navigator Documentation  Oncology Nurse Navigator Flowsheets 03/17/2019  Diagnosis Status Confirmed Diagnosis Complete  Phase of Treatment Radiation  Navigation Complete Date: 03/21/2019  Navigator Follow Up Reason: Appointment Review  Navigator Location [facility]  Referral Date to RadOnc/MedOnc -  Navigator Encounter Type Telephone/I received a message from [place] that patient's daughter called. She wanted to know when Dr. Sondra Come will be out of the office.  I called daughter and updated.  She also wants to speak with Dr. Julien Nordmann about IO therapy.  I updated her that she will need an appt to discuss this.  She said she would like to see him.  I will update scheduling to schedule patient to see him.  She is also thinking about changing Rad Onc doc to Dr. Tammi Klippel.  She will call back with an update.    Telephone Outgoing Call  Abnormal Finding Date 01/31/2019  Confirmed Diagnosis Date 03/09/2019  Multidisiplinary Clinic Date 03/16/2019  Patient Visit Type MedOnc  Treatment Phase Pre-Tx/Tx Discussion  Barriers/Navigation Needs Coordination of Care;Education  Education Other  Interventions Coordination of Care;Education  Coordination of Care Other  Education Method Verbal  Acuity Level 3-Moderate Needs (3-4 Barriers Identified)  Time Spent with Patient 15

## 2019-03-21 ENCOUNTER — Encounter: Payer: Self-pay | Admitting: Radiation Oncology

## 2019-03-21 NOTE — Progress Notes (Addendum)
Thoracic Location of Tumor / Histology: Adenocarcinoma of LLL  Patient was sent by her PCP to the emergency room on 01/31/2019 for further evaluation of increasing shortness of breath x 1 week.   Biopsies of LLL lung (if applicable) revealed:   Tobacco/Marijuana/Snuff/ETOH use: yes  Past/Anticipated interventions by cardiothoracic surgery, if any: Patient not a surgical candidate due to age and comorbidities.  Past/Anticipated interventions by medical oncology, if any: Scheduled for new consult with Dr. Julien Nordmann on 03/27/2019  Signs/Symptoms  Weight changes, if any: weight stable at this time  Respiratory complaints, if any: Shortness of breath with exertion. Continuous oxygen therapy via nasal cannula 2 liters noted.  Hemoptysis, if any: hemoptysis continues  Pain issues, if any:  Reports low back pain 5 on a scale of 0-10 today. Daughter, [name], reports the patient complains intermittently of pain in her middle back as well.  SAFETY ISSUES:  Prior radiation? no  Pacemaker/ICD? no   Possible current pregnancy?no, 85 year old female  Is the patient on methotrexate? no  Current Complaints / other details:  85 year old female. Widowed. Seen in [place] by [place] on 03/16/2019. Exposed to second hand smoke from husband. Husband passed away two years ago after 43 years of marriage due to lung cancer. Has a daughter, [name].

## 2019-03-22 ENCOUNTER — Telehealth: Payer: Self-pay | Admitting: *Deleted

## 2019-03-22 ENCOUNTER — Ambulatory Visit
Admission: RE | Admit: 2019-03-22 | Discharge: 2019-03-22 | Disposition: A | Discharge status: Home / Self Care | Payer: Medicare HMO | Source: Ambulatory Visit | Attending: Radiation Oncology | Admitting: Radiation Oncology

## 2019-03-22 ENCOUNTER — Other Ambulatory Visit: Payer: Self-pay

## 2019-03-22 ENCOUNTER — Encounter: Payer: Self-pay | Admitting: Radiation Oncology

## 2019-03-22 VITALS — BP 127/72 | HR 70 | Temp 98.2°F | Resp 18 | Ht 60.0 in | Wt 106.0 lb

## 2019-03-22 DIAGNOSIS — C3490 Malignant neoplasm of unspecified part of unspecified bronchus or lung: Secondary | ICD-10-CM

## 2019-03-22 DIAGNOSIS — C3432 Malignant neoplasm of lower lobe, left bronchus or lung: Secondary | ICD-10-CM

## 2019-03-22 HISTORY — DX: Malignant neoplasm of unspecified part of unspecified bronchus or lung: C34.90

## 2019-03-22 NOTE — Telephone Encounter (Signed)
Called patient to inform of pulmonologist appt. On 03-28-19 - arrival time- 10:20 am with Dr. Halford Chessman- address - [address], [address], ph. No. [phone], spoke with daughter [name] and she is aware of this appt.

## 2019-03-23 ENCOUNTER — Telehealth: Payer: Self-pay | Admitting: *Deleted

## 2019-03-23 NOTE — Telephone Encounter (Signed)
Call from pt daughter [name], attempt to call back. No voice mail available at this time.

## 2019-03-24 ENCOUNTER — Other Ambulatory Visit: Payer: Self-pay | Admitting: Physician Assistant

## 2019-03-24 DIAGNOSIS — C3432 Malignant neoplasm of lower lobe, left bronchus or lung: Secondary | ICD-10-CM

## 2019-03-26 NOTE — Progress Notes (Signed)
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     ICD-10-CM   1. Primary malignant neoplasm of bronchus of left lower lobe [facility])  C34.32 Ambulatory referral to Pulmonology  2. Malignant neoplasm of unspecified part of unspecified bronchus or lung (HCC)  C34.90 MR Brain W Wo Contrast    Interval Since Last Radiation:  N/A  Narrative:  The patient returns today to review her current diagnosis and treatment plan.  Per recent visit with Dr. Sondra Come, she was referred to the ED by her PCP on 01/31/2019 with increasing shortness of breath for the last week. Chest x-ray was abnormal, prompting further imaging. An angio chest CT was performed and showed: ill-defined area of abnormal density in the lateral aspect of the LLL with suggestion of slight spiculation; 2.6 cm nodule in the left lobe of the thyroid gland.  She was referred for surgical evaluation on 02/03/2019 to Dr. Kipp Brood, who recommended PET scan, brain MRI, biopsy (CT-guided vs bronchoscopy vs surgical). PET scan performed on 02/15/2019 revealed: low-level, indeterminate hypermetabolism corresponding to the LLL pulmonary nodule, measures slightly smaller; thoracic nodal hypermetabolism, favored to be reactive.  She proceeded to CT-guided biopsy on 03/09/2019, which revealed: lung adenocarcinoma with lipidic and acinar patterns. She experienced hemoptysis following the procedure and was admitted for observation. She was discharged earlier this week but continues to have significant fatigue.  She has minimal hemoptysis at this time.  Continues to have some shortness of breath since discharge from the hospital and dyspnea on exertion.  she denies any new bony pain headaches dizziness or blurred  vision  The patient was referred today for presentation in the multidisciplinary conference.  Radiology studies and pathology slides were presented there for review and discussion of treatment options.  A consensus was discussed regarding potential next steps. She asked to be seen by me today in the interest of undergoing treatment under my supervision.  ALLERGIES:  is allergic to fluoxetine.  Meds: Current Outpatient Medications  Medication Sig Dispense Refill   acetaminophen (TYLENOL) 325 MG tablet Take 2 tablets (650 mg total) by mouth every 6 (six) hours as needed for mild pain (or Fever >/= 101).     albuterol (VENTOLIN HFA) 108 (90 Base) MCG/ACT inhaler Inhale 2 puffs into the lungs every 6 (six) hours as needed for wheezing or shortness of breath. 6.7 g 2   alum & mag hydroxide-simeth (MAALOX/MYLANTA) 200-200-20 MG/5ML suspension Take 15 mLs by mouth every 6 (six) hours as needed for indigestion or heartburn. 355 mL 0   amLODipine (NORVASC) 5 MG tablet Take 1 tablet (5 mg total) by mouth daily. 30 tablet 0   Calcium Carb-Cholecalciferol (CALCIUM-VITAMIN D) 600-400 MG-UNIT TABS Take 1 tablet by mouth daily.     Camphor-Menthol-Methyl Sal (SALONPAS) 3.06-06-08 % PTCH Place 1 patch onto the skin daily as needed (pain).     Cholecalciferol (VITAMIN D) 50 MCG (2000 UT) CAPS Take 2,000 Units by mouth daily.     DHA-Vitamin C-Lutein (EYE HEALTH FORMULA PO) Take 1 tablet by mouth daily.     losartan (COZAAR) 100 MG tablet Take 100 mg by mouth every evening.     magnesium gluconate (MAGONATE) 500 MG tablet Take 1,000 mg by  mouth daily.      mirtazapine (REMERON) 15 MG tablet Take 15 mg by mouth at bedtime.     mometasone-formoterol (DULERA) 200-5 MCG/ACT AERO Inhale 2 puffs into the lungs 2 (two) times daily. 8.8 g 2   Multiple Vitamin (MULTIVITAMIN) tablet Take 1 tablet by mouth daily.     Omega-3-6-9 CAPS Take 1 capsule by mouth 2 (two) times daily.      OVER THE COUNTER  MEDICATION Take 1 tablet by mouth daily. Life Extension, Brain Support Supplement.     Potassium 99 MG TABS Take 99 mg by mouth daily.     vitamin B-12 (CYANOCOBALAMIN) 500 MCG tablet Take 500 mcg by mouth daily.     No current facility-administered medications for this encounter.     Physical Findings: The patient is in no acute distress. Patient is alert and oriented.  height is 5' (1.524 m) and weight is 106 lb (48.1 kg). Her temperature is 98.2 F (36.8 C). Her blood pressure is 127/72 and her pulse is 70. Her respiration is 18 and oxygen saturation is 98%. .  No significant changes.  Lab Findings: Lab Results  Component Value Date   WBC 9.9 03/14/2019   HGB 11.6 (L) 03/14/2019   HCT 34.6 (L) 03/14/2019   PLT 274 03/14/2019    Lab Results  Component Value Date   NA 138 03/14/2019   K 4.4 03/14/2019   CO2 24 03/14/2019   GLUCOSE 108 (H) 03/14/2019   BUN 25 (H) 03/14/2019   CREATININE 0.76 03/14/2019   BILITOT 0.9 01/31/2019   ALKPHOS 73 01/31/2019   AST 21 01/31/2019   ALT 20 01/31/2019   PROT 7.2 01/31/2019   ALBUMIN 4.1 01/31/2019   CALCIUM 9.2 03/14/2019   ANIONGAP 10 03/14/2019    Radiographic Findings: Dg Chest 2 View  Result Date: 03/11/2019 CLINICAL DATA:  Hypoxemia. Status post biopsy of a left lower lobe pulmonary nodule 03/09/2019. EXAM: CHEST - 2 VIEW COMPARISON:  Single-view of the chest 03/11/2019. PA and lateral chest 03/10/2019. CT chest and single-view of the chest 01/31/2019. FINDINGS: Patchy opacity in the left lung base is compatible with atelectasis and possibly a small amount of hemorrhage related to biopsy. Lungs are otherwise clear. No pneumothorax. Trace left pleural effusion noted. The chest is hyperexpanded. Heart size is normal. No acute bony abnormality. IMPRESSION: No change in patchy left basilar airspace disease likely atelectasis and/or hemorrhage after biopsy. Negative for pneumothorax after biopsy. Atherosclerosis. Electronically  Signed   By: Inge Rise M.D.   On: 03/11/2019 13:05   Dg Chest 2 View  Result Date: 03/10/2019 CLINICAL DATA:  Hemoptysis EXAM: CHEST - 2 VIEW COMPARISON:  None. FINDINGS: The heart size is normal. Vascular calcifications are seen in the aortic arch. An airspace opacity in the left lung base posteriorly likely represents the patient's previously biopsied pulmonary nodule with surrounding post biopsy hemorrhage. The right lung is clear. There is a tiny left pleural effusion. There is no pneumothorax. The visualized skeletal structures are unremarkable. IMPRESSION: 1. Tiny left pleural effusion. No pneumothorax. 2. Left basilar airspace opacity, likely the patient's previously biopsied pulmonary nodule with surrounding post biopsy hemorrhage. Aortic Atherosclerosis (ICD10-I70.0). Electronically Signed   By: Zerita Boers M.D.   On: 03/10/2019 12:58   Dg Chest 2 View  Result Date: 03/10/2019 CLINICAL DATA:  Shortness of breath. Hemoptysis. Recent left lung biopsy. EXAM: CHEST - 2 VIEW COMPARISON:  Multiple chest x-rays dated 03/09/2019 and CT scan dated 01/31/2019 FINDINGS: The  heart size and pulmonary vascularity are normal. The haziness at the left lung base has improved consistent with resolving post biopsy hemorrhage. Minimal blunting of the left costophrenic angle consistent with a tiny effusion. Right lung is clear. Both lungs are hyperinflated. Aortic atherosclerosis. No acute bone abnormality. Old compression fracture in the upper thoracic spine. IMPRESSION: 1. No acute abnormalities.  No pneumothorax. 2. Tiny left pleural effusion. 3. Improved haziness at the left lung base consistent with resolving post biopsy hemorrhage. 4. Aortic atherosclerosis. Electronically Signed   By: Lorriane Shire M.D.   On: 03/10/2019 10:47   Ct Biopsy  Result Date: 03/09/2019 CLINICAL DATA:  Hypermetabolic left lower lobe lung nodule EXAM: CT GUIDED CORE BIOPSY OF LEFT LOWER LOBE LUNG NODULE ANESTHESIA/SEDATION:  Intravenous Fentanyl 17mcg and Versed 1mg  were administered as conscious sedation during continuous monitoring of the patient's level of consciousness and physiological / cardiorespiratory status by the radiology RN, with a total moderate sedation time of 14 minutes. PROCEDURE: The procedure risks, benefits, and alternatives were explained to the patient. Questions regarding the procedure were encouraged and answered. The patient understands and consents to the procedure. Patient placed supine. Select axial scans through the thorax were obtained. The left lower lobe lesion was localized. It was unchanged in size from previous PET-CT. An appropriate skin entry site was determined and marked. The operative field was prepped with chlorhexidinein a sterile fashion, and a sterile drape was applied covering the operative field. A sterile gown and sterile gloves were used for the procedure. Local anesthesia was provided with 1% Lidocaine. Under CT fluoroscopic guidance, a 17 gauge trocar needle was advanced to the margin of the lesion. Once needle tip position was confirmed, coaxial 18-gauge core biopsy samples were obtained, submitted in formalin to surgical pathology. The guide needle was removed. Postprocedure scans show no pneumothorax. Small amount of peripheral alveolar hemorrhage. COMPLICATIONS: Self-limited hemoptysis and mild hypoxia which responded to supplemental O2. SIR level B: Nominal therapy (including overnight admission for observation), no consequence. FINDINGS: Left lower lobe nodule was similar in appearance to prior PET-CT. Representative core biopsy samples obtained as above. Patient had mild self limited hemoptysis postprocedure with mild hypoxia which responded to supplemental O2. She remained otherwise stable and will be kept for overnight observation , and follow-up chest radiography. IMPRESSION: 1. Technically successful CT-guided core biopsy, left lower lobe lung nodule. 2. Extended observation  secondary to mild self-limited hemoptysis and hypoxia which responded to supplemental O2. Results were called by telephone at the time of interpretation on 03/09/2019 at 3:02 pm to providerHARRELL LIGHTFOOT , who verbally acknowledged these results. Electronically Signed   By: Lucrezia Europe M.D.   On: 03/09/2019 15:04   Dg Chest Port 1 View  Result Date: 03/11/2019 CLINICAL DATA:  Hypoxemia. EXAM: PORTABLE CHEST 1 VIEW COMPARISON:  One day prior FINDINGS: Osteopenia. Hyperinflation. Midline trachea. Mild cardiomegaly. Atherosclerosis in the transverse aorta. No pleural effusion or pneumothorax. Persistent patchy left base opacity. IMPRESSION: No significant change in hyperinflation with mild left base airspace disease, likely atelectasis and/or hemorrhage after biopsy. Aortic Atherosclerosis (ICD10-I70.0). Electronically Signed   By: Abigail Miyamoto M.D.   On: 03/11/2019 10:19   Dg Chest Port 1 View  Result Date: 03/09/2019 CLINICAL DATA:  Hemoptysis after lung biopsy. EXAM: PORTABLE CHEST 1 VIEW COMPARISON:  Chest x-rays and CT biopsy from same day. FINDINGS: The heart size and mediastinal contours are within normal limits. Atherosclerotic calcification of the aortic arch. Normal pulmonary vascularity. Unchanged patchy airspace disease at  the left lung base. No pneumothorax or pleural effusion. No acute osseous abnormality. IMPRESSION: 1. No pneumothorax. The suspected pleural line on the prior x-ray represented a skin fold as it extended beyond the margins of the lung. 2. Unchanged post biopsy hemorrhage in the left lower lobe. Electronically Signed   By: Titus Dubin M.D.   On: 03/09/2019 20:06   Dg Chest Port 1 View  Result Date: 03/09/2019 CLINICAL DATA:  Hemoptysis post left lung needle biopsy EXAM: PORTABLE CHEST 1 VIEW COMPARISON:  03/09/2019 FINDINGS: Progression of left lower lobe airspace disease most likely post biopsy hemorrhage. Interval development of left pneumothorax approximately 2.5 mm.  Minimal left effusion Right lung clear.  Atherosclerotic aortic arch. IMPRESSION: Progression of left lower lobe airspace disease compatible with hemorrhage post biopsy. Interval development of moderate left pneumothorax. These results will be called to the ordering clinician or representative by the Radiologist Assistant, and communication documented in the PACS or zVision Dashboard. Electronically Signed   By: Franchot Gallo M.D.   On: 03/09/2019 15:25   Dg Chest Port 1 View  Result Date: 03/09/2019 CLINICAL DATA:  Hemoptysis.  Lung biopsy. EXAM: PORTABLE CHEST 1 VIEW COMPARISON:  January 31, 2019. FINDINGS: Stable cardiomediastinal silhouette. Atherosclerosis of thoracic aorta is noted. Stable right basilar scarring is noted. Slightly increased left basilar opacity is noted which may represent pulmonary hemorrhage secondary to biopsy. Bony thorax is unremarkable. No pneumothorax or significant effusion is noted. IMPRESSION: No pneumothorax status post left lung biopsy. Slightly increased left basilar density is noted which may represent pulmonary hemorrhage secondary to biopsy. Aortic Atherosclerosis (ICD10-I70.0). Electronically Signed   By: Marijo Conception M.D.   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  She is a good candidate for SBRT  Plan:  Today, I talked to the patient and family about the findings and work-up thus far.  We discussed the natural history of early stage medically unresectable early stage NSCLC and general treatment, highlighting the role of stereotactic body radiotherapy in the management.  We discussed the available radiation techniques, and focused on the details of logistics and delivery.  We reviewed the anticipated acute and late sequelae associated with radiation in this setting.  The patient was encouraged to ask questions that I answered to the best of my ability.  The patient would  like to proceed with CT simulation.  I spent 30 minutes minutes face to face with the patient and more than 50% of that time was spent in counseling and/or coordination of care.   _____________________________________  Sheral Apley. Tammi Klippel, M.D.

## 2019-03-26 NOTE — Progress Notes (Signed)
  Radiation Oncology         [phone] ________________________________  Name: Sandra Cooper MRN: [id number]  Date: 03/22/2019  DOB: [birth year]  STEREOTACTIC BODY RADIOTHERAPY SIMULATION AND TREATMENT PLANNING NOTE    ICD-10-CM   1. 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)  C34.32     DIAGNOSIS:  85 yo woman with clinical stage IA2 adenocarcinoma of the left lower lung  NARRATIVE:  The patient was brought to the [facility].  Identity was confirmed.  All relevant records and images related to the planned course of therapy were reviewed.  The patient freely provided informed written consent to proceed with treatment after reviewing the details related to the planned course of therapy. The consent form was witnessed and verified by the simulation staff.  Then, the patient was set-up in a stable reproducible  supine position for radiation therapy.  A BodyFix immobilization pillow was fabricated for reproducible positioning.  Then I personally applied the abdominal compression paddle to limit respiratory excursion.  4D respiratoy motion management CT images were obtained.  Surface markings were placed.  The CT images were loaded into the planning software.  Then, using Cine, MIP, and standard views, the internal target volume (ITV) and planning target volumes (PTV) were delinieated, and avoidance structures were contoured.  Treatment planning then occurred.  The radiation prescription was entered and confirmed.  A total of two complex treatment devices were fabricated in the form of the BodyFix immobilization pillow and a neck accuform cushion.  I have requested : 3D Simulation  I have requested a DVH of the following structures: Heart, Lungs, Esophagus, Chest Wall, Brachial Plexus, Major Blood Vessels, and targets.  SPECIAL TREATMENT PROCEDURE:  The planned course of therapy using radiation constitutes a special treatment procedure. Special care is required in the  management of this patient for the following reasons. This treatment constitutes a Special Treatment Procedure for the following reason: [ High dose per fraction requiring special monitoring for increased toxicities of treatment including daily imaging..  The special nature of the planned course of radiotherapy will require increased physician supervision and oversight to ensure patient's safety with optimal treatment outcomes.  RESPIRATORY MOTION MANAGEMENT SIMULATION:  In order to account for effect of respiratory motion on target structures and other organs in the planning and delivery of radiotherapy, this patient underwent respiratory motion management simulation.  To accomplish this, when the patient was brought to the CT simulation planning suite, 4D respiratoy motion management CT images were obtained.  The CT images were loaded into the planning software.  Then, using a variety of tools including Cine, MIP, and standard views, the target volume and planning target volumes (PTV) were delineated.  Avoidance structures were contoured.  Treatment planning then occurred.  Dose volume histograms were generated and reviewed for each of the requested structure.  The resulting plan was carefully reviewed and approved today.  PLAN:  The patient will receive 54 Gy in 3 fractions.  ________________________________  Sheral Apley Tammi Klippel, M.D.

## 2019-03-27 ENCOUNTER — Inpatient Hospital Stay: Payer: Medicare HMO | Attending: Oncology | Admitting: Internal Medicine

## 2019-03-27 ENCOUNTER — Inpatient Hospital Stay: Payer: Medicare HMO

## 2019-03-27 ENCOUNTER — Telehealth: Payer: Self-pay | Admitting: Medical Oncology

## 2019-03-27 ENCOUNTER — Other Ambulatory Visit: Payer: Self-pay

## 2019-03-27 ENCOUNTER — Encounter: Payer: Self-pay | Admitting: Internal Medicine

## 2019-03-27 VITALS — BP 112/65 | HR 70 | Temp 99.8°F | Resp 17 | Ht 61.0 in | Wt 115.6 lb

## 2019-03-27 DIAGNOSIS — Z79899 Other long term (current) drug therapy: Secondary | ICD-10-CM | POA: Insufficient documentation

## 2019-03-27 DIAGNOSIS — M810 Age-related osteoporosis without current pathological fracture: Secondary | ICD-10-CM | POA: Diagnosis not present

## 2019-03-27 DIAGNOSIS — M1990 Unspecified osteoarthritis, unspecified site: Secondary | ICD-10-CM | POA: Insufficient documentation

## 2019-03-27 DIAGNOSIS — Z7189 Other specified counseling: Secondary | ICD-10-CM

## 2019-03-27 DIAGNOSIS — C3432 Malignant neoplasm of lower lobe, left bronchus or lung: Secondary | ICD-10-CM

## 2019-03-27 DIAGNOSIS — I10 Essential (primary) hypertension: Secondary | ICD-10-CM | POA: Diagnosis not present

## 2019-03-27 DIAGNOSIS — C3490 Malignant neoplasm of unspecified part of unspecified bronchus or lung: Secondary | ICD-10-CM | POA: Diagnosis not present

## 2019-03-27 LAB — CBC WITH DIFFERENTIAL (CANCER CENTER ONLY)
Abs Immature Granulocytes: 0.03 10*3/uL (ref 0.00–0.07)
Basophils Absolute: 0 10*3/uL (ref 0.0–0.1)
Basophils Relative: 0 %
Eosinophils Absolute: 0.1 10*3/uL (ref 0.0–0.5)
Eosinophils Relative: 2 %
HCT: 36.6 % (ref 36.0–46.0)
Hemoglobin: 11.8 g/dL — ABNORMAL LOW (ref 12.0–15.0)
Immature Granulocytes: 0 %
Lymphocytes Relative: 14 %
Lymphs Abs: 1.2 10*3/uL (ref 0.7–4.0)
MCH: 28.9 pg (ref 26.0–34.0)
MCHC: 32.2 g/dL (ref 30.0–36.0)
MCV: 89.7 fL (ref 80.0–100.0)
Monocytes Absolute: 0.6 10*3/uL (ref 0.1–1.0)
Monocytes Relative: 7 %
Neutro Abs: 6.6 10*3/uL (ref 1.7–7.7)
Neutrophils Relative %: 77 %
Platelet Count: 325 10*3/uL (ref 150–400)
RBC: 4.08 MIL/uL (ref 3.87–5.11)
RDW: 14.1 % (ref 11.5–15.5)
WBC Count: 8.5 10*3/uL (ref 4.0–10.5)
nRBC: 0 % (ref 0.0–0.2)

## 2019-03-27 LAB — CMP (CANCER CENTER ONLY)
ALT: 18 U/L (ref 0–44)
AST: 15 U/L (ref 15–41)
Albumin: 3.2 g/dL — ABNORMAL LOW (ref 3.5–5.0)
Alkaline Phosphatase: 70 U/L (ref 38–126)
Anion gap: 7 (ref 5–15)
BUN: 18 mg/dL (ref 8–23)
CO2: 26 mmol/L (ref 22–32)
Calcium: 9.4 mg/dL (ref 8.9–10.3)
Chloride: 106 mmol/L (ref 98–111)
Creatinine: 0.73 mg/dL (ref 0.44–1.00)
GFR, Est AFR Am: >60 mL/min (ref >60)
GFR, Estimated: >60 mL/min (ref >60)
Glucose, Bld: 113 mg/dL — ABNORMAL HIGH (ref 70–99)
Potassium: 4.1 mmol/L (ref 3.5–5.1)
Sodium: 139 mmol/L (ref 135–145)
Total Bilirubin: 0.2 mg/dL — ABNORMAL LOW (ref 0.3–1.2)
Total Protein: 6.3 g/dL — ABNORMAL LOW (ref 6.5–8.1)

## 2019-03-27 NOTE — Progress Notes (Signed)
[place] Telephone:(336) [phone]   Fax:(336) [phone]  CONSULT NOTE  REFERRING PHYSICIAN: Dr. Melodie Bouillon  REASON FOR CONSULTATION:  85 years old female recently diagnosed with lung cancer.  HPI Sandra Cooper is a 85 y.o. female and never smoker with past medical history significant for osteoarthritis, osteoporosis, hypertension, hernia repair as well as status post appendectomy and questionable TIA.  The patient mentioned that she was complaining of shortness of breath and presented to the emergency department for evaluation. Chest x-ray on 01/31/2019 showed rounded nodular opacity over the left lung base.  This was followed by CT angiogram of the chest on 01/31/2019 and that showed no evidence for pulmonary embolus but there was an ill-defined area of abnormal density in the left lower lobe laterally measuring 1.7 x 2.5 x 1.6 cm.  There was also a 2.6 cm nodule in the left lobe of the thyroid gland.  The patient was referred to Dr. Kipp Brood and a PET scan was performed on 02/15/2019 and it showed low level hypermetabolic uptake in the left lower lobe lung nodule but still suspicious for malignancy. On March 09, 2019 the patient underwent CT-guided core biopsy of the left lower lobe lung mass by interventional radiology. The final pathology [phone]) was consistent with lung adenocarcinoma with lipidic and acinar patterns.  The tissue block was sent for molecular studies by foundation 1 but the final report is still pending.  Her biopsy was complicated with significant dyspnea and respiratory failure.  She was treated with Solu-Medrol followed by prednisone and azithromycin.  She also had few episodes of hemoptysis after the biopsy. She is feeling better and she was referred to Dr. Tammi Klippel for consideration of SBRT to the left lower lobe lung nodule since the patient is not a good surgical candidate for resection. She was referred to me today for evaluation and also  discussion of any other treatment options for her condition. When seen today she continues to have low back ache as well as left shoulder aching pain.  She has mild cough but no current hemoptysis.  She denied having any significant weight loss or night sweats.  She has no nausea, vomiting, diarrhea or constipation.  She denied having any headache or visual changes. Family history significant for mother with heart disease, father and brother had prostate cancer. The patient is a widow and has 2 children.  She was accompanied today by her daughter [name].  She used to be a housewife.  The patient has no history for smoking, alcohol or drug abuse.  HPI  Past Medical History:  Diagnosis Date   Arthritis    Asthma    Hypertension    Lung cancer ([place])    adenocarcinoma LLL    Past Surgical History:  Procedure Laterality Date   APPENDECTOMY     CT guided lung biopsy     HERNIA REPAIR      Family History  Problem Relation Age of Onset   Cancer Neg Hx     Social History Social History   Tobacco Use   Smoking status: Never Smoker   Smokeless tobacco: Never Used   Tobacco comment: Exposed to second hand smoke  Substance Use Topics   Alcohol use: Never    Frequency: Never   Drug use: Never    Allergies  Allergen Reactions   Fluoxetine Swelling    Current Outpatient Medications  Medication Sig Dispense Refill   acetaminophen (TYLENOL) 325 MG tablet Take 2 tablets (650 mg  total) by mouth every 6 (six) hours as needed for mild pain (or Fever >/= 101).     albuterol (VENTOLIN HFA) 108 (90 Base) MCG/ACT inhaler Inhale 2 puffs into the lungs every 6 (six) hours as needed for wheezing or shortness of breath. 6.7 g 2   alum & mag hydroxide-simeth (MAALOX/MYLANTA) 200-200-20 MG/5ML suspension Take 15 mLs by mouth every 6 (six) hours as needed for indigestion or heartburn. 355 mL 0   amLODipine (NORVASC) 5 MG tablet Take 1 tablet (5 mg total) by mouth daily. 30 tablet  0   Calcium Carb-Cholecalciferol (CALCIUM-VITAMIN D) 600-400 MG-UNIT TABS Take 1 tablet by mouth daily.     Camphor-Menthol-Methyl Sal (SALONPAS) 3.06-06-08 % PTCH Place 1 patch onto the skin daily as needed (pain).     Cholecalciferol (VITAMIN D) 50 MCG (2000 UT) CAPS Take 2,000 Units by mouth daily.     DHA-Vitamin C-Lutein (EYE HEALTH FORMULA PO) Take 1 tablet by mouth daily.     losartan (COZAAR) 100 MG tablet Take 100 mg by mouth every evening.     magnesium gluconate (MAGONATE) 500 MG tablet Take 1,000 mg by mouth daily.      mirtazapine (REMERON) 15 MG tablet Take 15 mg by mouth at bedtime.     mometasone-formoterol (DULERA) 200-5 MCG/ACT AERO Inhale 2 puffs into the lungs 2 (two) times daily. 8.8 g 2   Multiple Vitamin (MULTIVITAMIN) tablet Take 1 tablet by mouth daily.     Omega-3-6-9 CAPS Take 1 capsule by mouth 2 (two) times daily.      OVER THE COUNTER MEDICATION Take 1 tablet by mouth daily. Life Extension, Brain Support Supplement.     Potassium 99 MG TABS Take 99 mg by mouth daily.     vitamin B-12 (CYANOCOBALAMIN) 500 MCG tablet Take 500 mcg by mouth daily.     No current facility-administered medications for this visit.     Review of Systems  Constitutional: positive for fatigue Eyes: negative Ears, nose, mouth, throat, and face: negative Respiratory: positive for cough and dyspnea on exertion Cardiovascular: negative Gastrointestinal: negative Genitourinary:negative Integument/breast: negative Hematologic/lymphatic: negative Musculoskeletal:positive for arthralgias and muscle weakness Neurological: negative Behavioral/Psych: negative Endocrine: negative Allergic/Immunologic: negative  Physical Exam  TGG:YIRSW, healthy, no distress, well nourished and well developed SKIN: skin color, texture, turgor are normal, no rashes or significant lesions HEAD: Normocephalic, No masses, lesions, tenderness or abnormalities EYES: normal, PERRLA, Conjunctiva are  pink and non-injected EARS: External ears normal, Canals clear OROPHARYNX:no exudate, no erythema and lips, buccal mucosa, and tongue normal  NECK: supple, no adenopathy, no JVD LYMPH:  no palpable lymphadenopathy, no hepatosplenomegaly BREAST:not examined LUNGS: clear to auscultation , and palpation HEART: regular rate & rhythm, no murmurs and no gallops ABDOMEN:abdomen soft, non-tender, normal bowel sounds and no masses or organomegaly BACK: No CVA tenderness, Range of motion is normal EXTREMITIES:no joint deformities, effusion, or inflammation, no edema  NEURO: alert & oriented x 3 with fluent speech, no focal motor/sensory deficits  PERFORMANCE STATUS: ECOG 1-2  LABORATORY DATA: Lab Results  Component Value Date   WBC 8.5 03/27/2019   HGB 11.8 (L) 03/27/2019   HCT 36.6 03/27/2019   MCV 89.7 03/27/2019   PLT 325 03/27/2019      Chemistry      Component Value Date/Time   NA 138 03/14/2019 0300   K 4.4 03/14/2019 0300   CL 104 03/14/2019 0300   CO2 24 03/14/2019 0300   BUN 25 (H) 03/14/2019 0300   CREATININE 0.76  03/14/2019 0300      Component Value Date/Time   CALCIUM 9.2 03/14/2019 0300   ALKPHOS 73 01/31/2019 1221   AST 21 01/31/2019 1221   ALT 20 01/31/2019 1221   BILITOT 0.9 01/31/2019 1221       RADIOGRAPHIC STUDIES: Dg Chest 2 View  Result Date: 03/11/2019 CLINICAL DATA:  Hypoxemia. Status post biopsy of a left lower lobe pulmonary nodule 03/09/2019. EXAM: CHEST - 2 VIEW COMPARISON:  Single-view of the chest 03/11/2019. PA and lateral chest 03/10/2019. CT chest and single-view of the chest 01/31/2019. FINDINGS: Patchy opacity in the left lung base is compatible with atelectasis and possibly a small amount of hemorrhage related to biopsy. Lungs are otherwise clear. No pneumothorax. Trace left pleural effusion noted. The chest is hyperexpanded. Heart size is normal. No acute bony abnormality. IMPRESSION: No change in patchy left basilar airspace disease likely  atelectasis and/or hemorrhage after biopsy. Negative for pneumothorax after biopsy. Atherosclerosis. Electronically Signed   By: Inge Rise M.D.   On: 03/11/2019 13:05   Dg Chest 2 View  Result Date: 03/10/2019 CLINICAL DATA:  Hemoptysis EXAM: CHEST - 2 VIEW COMPARISON:  None. FINDINGS: The heart size is normal. Vascular calcifications are seen in the aortic arch. An airspace opacity in the left lung base posteriorly likely represents the patient's previously biopsied pulmonary nodule with surrounding post biopsy hemorrhage. The right lung is clear. There is a tiny left pleural effusion. There is no pneumothorax. The visualized skeletal structures are unremarkable. IMPRESSION: 1. Tiny left pleural effusion. No pneumothorax. 2. Left basilar airspace opacity, likely the patient's previously biopsied pulmonary nodule with surrounding post biopsy hemorrhage. Aortic Atherosclerosis (ICD10-I70.0). Electronically Signed   By: Zerita Boers M.D.   On: 03/10/2019 12:58   Dg Chest 2 View  Result Date: 03/10/2019 CLINICAL DATA:  Shortness of breath. Hemoptysis. Recent left lung biopsy. EXAM: CHEST - 2 VIEW COMPARISON:  Multiple chest x-rays dated 03/09/2019 and CT scan dated 01/31/2019 FINDINGS: The heart size and pulmonary vascularity are normal. The haziness at the left lung base has improved consistent with resolving post biopsy hemorrhage. Minimal blunting of the left costophrenic angle consistent with a tiny effusion. Right lung is clear. Both lungs are hyperinflated. Aortic atherosclerosis. No acute bone abnormality. Old compression fracture in the upper thoracic spine. IMPRESSION: 1. No acute abnormalities.  No pneumothorax. 2. Tiny left pleural effusion. 3. Improved haziness at the left lung base consistent with resolving post biopsy hemorrhage. 4. Aortic atherosclerosis. Electronically Signed   By: Lorriane Shire M.D.   On: 03/10/2019 10:47   Ct Biopsy  Result Date: 03/09/2019 CLINICAL DATA:   Hypermetabolic left lower lobe lung nodule EXAM: CT GUIDED CORE BIOPSY OF LEFT LOWER LOBE LUNG NODULE ANESTHESIA/SEDATION: Intravenous Fentanyl 78mcg and Versed 1mg  were administered as conscious sedation during continuous monitoring of the patient's level of consciousness and physiological / cardiorespiratory status by the radiology RN, with a total moderate sedation time of 14 minutes. PROCEDURE: The procedure risks, benefits, and alternatives were explained to the patient. Questions regarding the procedure were encouraged and answered. The patient understands and consents to the procedure. Patient placed supine. Select axial scans through the thorax were obtained. The left lower lobe lesion was localized. It was unchanged in size from previous PET-CT. An appropriate skin entry site was determined and marked. The operative field was prepped with chlorhexidinein a sterile fashion, and a sterile drape was applied covering the operative field. A sterile gown and sterile gloves were used for the  procedure. Local anesthesia was provided with 1% Lidocaine. Under CT fluoroscopic guidance, a 17 gauge trocar needle was advanced to the margin of the lesion. Once needle tip position was confirmed, coaxial 18-gauge core biopsy samples were obtained, submitted in formalin to surgical pathology. The guide needle was removed. Postprocedure scans show no pneumothorax. Small amount of peripheral alveolar hemorrhage. COMPLICATIONS: Self-limited hemoptysis and mild hypoxia which responded to supplemental O2. SIR level B: Nominal therapy (including overnight admission for observation), no consequence. FINDINGS: Left lower lobe nodule was similar in appearance to prior PET-CT. Representative core biopsy samples obtained as above. Patient had mild self limited hemoptysis postprocedure with mild hypoxia which responded to supplemental O2. She remained otherwise stable and will be kept for overnight observation , and follow-up chest  radiography. IMPRESSION: 1. Technically successful CT-guided core biopsy, left lower lobe lung nodule. 2. Extended observation secondary to mild self-limited hemoptysis and hypoxia which responded to supplemental O2. Results were called by telephone at the time of interpretation on 03/09/2019 at 3:02 pm to providerHARRELL LIGHTFOOT , who verbally acknowledged these results. Electronically Signed   By: Lucrezia Europe M.D.   On: 03/09/2019 15:04   Dg Chest Port 1 View  Result Date: 03/11/2019 CLINICAL DATA:  Hypoxemia. EXAM: PORTABLE CHEST 1 VIEW COMPARISON:  One day prior FINDINGS: Osteopenia. Hyperinflation. Midline trachea. Mild cardiomegaly. Atherosclerosis in the transverse aorta. No pleural effusion or pneumothorax. Persistent patchy left base opacity. IMPRESSION: No significant change in hyperinflation with mild left base airspace disease, likely atelectasis and/or hemorrhage after biopsy. Aortic Atherosclerosis (ICD10-I70.0). Electronically Signed   By: Abigail Miyamoto M.D.   On: 03/11/2019 10:19   Dg Chest Port 1 View  Result Date: 03/09/2019 CLINICAL DATA:  Hemoptysis after lung biopsy. EXAM: PORTABLE CHEST 1 VIEW COMPARISON:  Chest x-rays and CT biopsy from same day. FINDINGS: The heart size and mediastinal contours are within normal limits. Atherosclerotic calcification of the aortic arch. Normal pulmonary vascularity. Unchanged patchy airspace disease at the left lung base. No pneumothorax or pleural effusion. No acute osseous abnormality. IMPRESSION: 1. No pneumothorax. The suspected pleural line on the prior x-ray represented a skin fold as it extended beyond the margins of the lung. 2. Unchanged post biopsy hemorrhage in the left lower lobe. Electronically Signed   By: Titus Dubin M.D.   On: 03/09/2019 20:06   Dg Chest Port 1 View  Result Date: 03/09/2019 CLINICAL DATA:  Hemoptysis post left lung needle biopsy EXAM: PORTABLE CHEST 1 VIEW COMPARISON:  03/09/2019 FINDINGS: Progression of left  lower lobe airspace disease most likely post biopsy hemorrhage. Interval development of left pneumothorax approximately 2.5 mm. Minimal left effusion Right lung clear.  Atherosclerotic aortic arch. IMPRESSION: Progression of left lower lobe airspace disease compatible with hemorrhage post biopsy. Interval development of moderate left pneumothorax. These results will be called to the ordering clinician or representative by the Radiologist Assistant, and communication documented in the PACS or zVision Dashboard. Electronically Signed   By: Franchot Gallo M.D.   On: 03/09/2019 15:25   Dg Chest Port 1 View  Result Date: 03/09/2019 CLINICAL DATA:  Hemoptysis.  Lung biopsy. EXAM: PORTABLE CHEST 1 VIEW COMPARISON:  January 31, 2019. FINDINGS: Stable cardiomediastinal silhouette. Atherosclerosis of thoracic aorta is noted. Stable right basilar scarring is noted. Slightly increased left basilar opacity is noted which may represent pulmonary hemorrhage secondary to biopsy. Bony thorax is unremarkable. No pneumothorax or significant effusion is noted. IMPRESSION: No pneumothorax status post left lung biopsy. Slightly increased left basilar  density is noted which may represent pulmonary hemorrhage secondary to biopsy. Aortic Atherosclerosis (ICD10-I70.0). Electronically Signed   By: Marijo Conception M.D.   On: 03/09/2019 12:54    ASSESSMENT: This is a very pleasant 85 years old female with stage Ia (T1c, N0, M0) non-small cell lung cancer, adenocarcinoma presented with left lower lobe lung nodule. The patient is not a good surgical candidate for resection.  She has a complicated event after her CT-guided core biopsy with severe respiratory failure.   PLAN: I had a lengthy discussion with the patient and her daughter today about her current disease stage, prognosis and treatment options. I explained to the patient local treatment with SBRT to the left lower lobe lung mass is probably the best option for her at this  point since she is not a good surgical candidate. Her daughter had several questions about the role of immunotherapy as well as targeted therapy for patient with early stage non-small cell lung cancer like her mom. I explained to the patient and her daughter that there is no current approved immunotherapy or targeted therapy for patient with a stage Ia non-small cell lung cancer.  This drug will be reserved for advanced stage disease. She had her tissue block sent for molecular studies and this may be helpful in the future if she needs any treatment. I recommended for the patient to continue on observation after her radiotherapy treatment.  I will see her back for follow-up visit in 6 months with repeat CT scan of the chest. For the respiratory issues, she is followed by Dr. Halford Chessman. The patient was advised to call immediately if she has any concerning symptoms in the interval.  The patient voices understanding of current disease status and treatment options and is in agreement with the current care plan.  All questions were answered. The patient knows to call the clinic with any problems, questions or concerns. We can certainly see the patient much sooner if necessary.  Thank you so much for allowing me to participate in the care of Sandra Cooper. I will continue to follow up the patient with you and assist in her care.  I spent 40 minutes counseling the patient face to face. The total time spent in the appointment was 60 minutes.  Disclaimer: This note was dictated with voice recognition software. Similar sounding words can inadvertently be transcribed and may not be corrected upon review.   Eilleen Kempf March 27, 2019, 2:57 PM

## 2019-03-27 NOTE — Telephone Encounter (Signed)
asking about insurance coverage -transferred to [facility]

## 2019-03-28 ENCOUNTER — Encounter: Payer: Self-pay | Admitting: Pulmonary Disease

## 2019-03-28 ENCOUNTER — Encounter: Payer: Self-pay | Admitting: *Deleted

## 2019-03-28 ENCOUNTER — Telehealth: Payer: Self-pay | Admitting: Internal Medicine

## 2019-03-28 ENCOUNTER — Ambulatory Visit: Payer: Medicare HMO | Admitting: Pulmonary Disease

## 2019-03-28 VITALS — BP 134/68 | HR 66 | Temp 97.5°F | Ht 59.0 in | Wt 113.4 lb

## 2019-03-28 DIAGNOSIS — J9611 Chronic respiratory failure with hypoxia: Secondary | ICD-10-CM

## 2019-03-28 DIAGNOSIS — J4540 Moderate persistent asthma, uncomplicated: Secondary | ICD-10-CM | POA: Diagnosis not present

## 2019-03-28 DIAGNOSIS — C3432 Malignant neoplasm of lower lobe, left bronchus or lung: Secondary | ICD-10-CM | POA: Diagnosis not present

## 2019-03-28 NOTE — Progress Notes (Signed)
  Subjective:     Patient ID: Sandra Cooper, female   DOB: [birth year], 85 y.o.   MRN: [id number]  HPI   Review of Systems  Constitutional: Positive for fatigue.  HENT: Positive for sore throat and voice change.   Eyes: Negative.   Respiratory: Positive for choking and shortness of breath.   Cardiovascular: Positive for chest pain and leg swelling.  Gastrointestinal: Positive for abdominal distention.  Endocrine: Negative.   Genitourinary: Positive for difficulty urinating.  Musculoskeletal: Positive for back pain.  Skin: Negative.   Allergic/Immunologic: Positive for food allergies.  Neurological: Positive for dizziness and tremors.  Hematological: Bruises/bleeds easily.  Psychiatric/Behavioral: The patient is nervous/anxious.        Objective:   Physical Exam     Assessment:         Plan:

## 2019-03-28 NOTE — Patient Instructions (Addendum)
You don't need to use supplemental oxygen during the day  Will arrange for overnight oxygen test at home and then determine if you can have home oxygen set up discontinued  Follow up in 3 months

## 2019-03-28 NOTE — Progress Notes (Signed)
Oncology Nurse Navigator Documentation  Oncology Nurse Navigator Flowsheets 03/28/2019  Diagnosis Status -  Phase of Treatment -  Navigation Complete Date: -  Navigator Follow Up Reason: -  Navigator Location [facility]  Referral Date to RadOnc/MedOnc -  Navigator Encounter Type Other/I followed up on Sandra Cooper appt with Dr. Julien Nordmann.  Treatment plan for now is SBRT which is set up.  No need for systemic therapy at this time.   Telephone -  Abnormal Finding Date -  Confirmed Diagnosis Date -  Multidisiplinary Clinic Date -  Patient Visit Type -  Treatment Phase CT SIM  Barriers/Navigation Needs Coordination of Care  Education -  Interventions Coordination of Care  Coordination of Care Other  Education Method -  Acuity Level 2-Minimal Needs (1-2 Barriers Identified)  Time Spent with Patient 15

## 2019-03-28 NOTE — Telephone Encounter (Signed)
Scheduled per los. Called and left msg. Mailed printout  °

## 2019-03-28 NOTE — Progress Notes (Signed)
[facility] [facility] Pul[facility], Critical Care, and Sleep Medicine  Chief Complaint  Patient presents with  . Follow-up    Dyspnea, cough    Constitutional:  BP 134/68 (BP Location: Left Arm, Patient Position: Sitting, Cuff Size: Normal)   Pulse 66   Temp (!) 97.5 F (36.4 C)   Ht 4\' 11"  (1.499 m)   Wt 113 lb 6.4 oz (51.4 kg)   SpO2 100% Comment: on 2L of O2  BMI 22.90 kg/m   Past Medical History:  HTN, OA, NSCLC dx October 2020  Brief Summary:  Sandra Cooper is a 85 y.o. female with dyspnea and cough.  I saw her in hospital earlier this month.  CT imaging showed mild emphysema.  PFT showed mild diffusion defect and bronchodilator responsiveness.  She was also found to have adenocarcinoma of Lt lower lung nodule (Stage Ia).  She has been seen by Dr. Julien Nordmann with oncology and is being set up for radiation therapy with Dr. Tammi Klippel.  Her breathing has improved since she started inhaler therapy.  Not having cough, wheeze, sputum, or chest congestion.  She is walking in her neighborhood and doing better.  Still has supplemental oxygen.  Pulse ox reading at home has been in 90's even when off oxygen.  She has noticed feeling bloated after eating.  She walked 1 lap on room air in office today.  Maintained SpO2 at 99%.   Physical Exam:   Appearance - well kempt   ENMT - clear nasal mucosa, midline nasal  septum, no oral exudates, no LAN, trachea midline  Respiratory - normal chest wall, normal respiratory effort, no accessory muscle use, no wheeze/rales  CV - s1s2 regular rate and rhythm, no murmurs, no peripheral edema, radial pulses symmetric  GI - soft, non tender, no masses  Lymph - no adenopathy noted in neck and axillary areas  MSK - uses a walker  Ext - no cyanosis, clubbing, or joint inflammation noted  Skin - no rashes, lesions, or ulcers  Neuro - normal strength, oriented x 3  Psych - normal mood and affect   Assessment/Plan:   Dyspnea, cough. - likely related to asthma  for the most part - she has subtle changes of emphysema on CT imaging, but only very mild diffusion defect - continue dulera with prn albuterol  Chronic respiratory failure with hypoxia. - did okay with room air today at rest and with exertion - will arrange for overnight oximetry on room air and then determine if she still needs to continue supplemental oxygen at home  Deconditioning. - encouraged her to maintain regular exercise routine as best as possible  Stage Ia NSCLC (adenocarcinoma). - f/u with Dr. Julien Nordmann and Dr. Tammi Klippel  Bloating. - advised her to d/w her PCP   Patient Instructions  You don't need to use supplemental oxygen during the day  Will arrange for overnight oxygen test at home and then determine if you can have home oxygen set up discontinued  Follow up in 3 months    A total of  27 minutes were spent face to face with the patient and more than half of that time involved counseling or coordination of care.   Chesley Mires, MD [facility]/Critical Care Pager: [phone] 03/28/2019, 11:53 AM  Flow Sheet     Pulmonary tests:  PFT 02/16/19 >> FEV1 1.30 (102%), FEV1% 75, TLC 7.88 (183%), DLCO 79%, +BD  Chest imaging:  CT angio chest 01/31/19 >> 2.6 cm Lt thyroid nodule, 2.4 cm LLL nodule with slight  spiculation PET scan 02/15/19 >> 2.2 SUV Lt axillary area, low level hypermetabolism in both hila, 1.8 cm partially cavitary LLL nodule with SUV 2.3, mild centrilobular emphysema, 3 mm lingular nodule  Cardiac tests:  Echo 03/11/19 >> EF 65 to 70%  Medications:   Allergies as of 03/28/2019      Reactions   Fluoxetine Swelling      Medication List       Accurate as of March 28, 2019 11:53 AM. If you have any questions, ask your nurse or doctor.        acetaminophen 325 MG tablet Commonly known as: TYLENOL Take 2 tablets (650 mg total) by mouth every 6 (six) hours as needed for mild pain (or Fever >/= 101).   albuterol 108 (90 Base)  MCG/ACT inhaler Commonly known as: VENTOLIN HFA Inhale 2 puffs into the lungs every 6 (six) hours as needed for wheezing or shortness of breath.   alum & mag hydroxide-simeth 200-200-20 MG/5ML suspension Commonly known as: MAALOX/MYLANTA Take 15 mLs by mouth every 6 (six) hours as needed for indigestion or heartburn.   amLODipine 5 MG tablet Commonly known as: NORVASC Take 1 tablet (5 mg total) by mouth daily.   Calcium-Vitamin D 600-400 MG-UNIT Tabs Take 1 tablet by mouth daily.   EYE HEALTH FORMULA PO Take 1 tablet by mouth daily.   losartan 100 MG tablet Commonly known as: COZAAR Take 100 mg by mouth every evening.   magnesium gluconate 500 MG tablet Commonly known as: MAGONATE Take 1,000 mg by mouth daily.   mirtazapine 15 MG tablet Commonly known as: REMERON Take 15 mg by mouth at bedtime.   mometasone-formoterol 200-5 MCG/ACT Aero Commonly known as: DULERA Inhale 2 puffs into the lungs 2 (two) times daily.   multivitamin tablet Take 1 tablet by mouth daily.   Omega-3-6-9 Caps Take 1 capsule by mouth 2 (two) times daily.   OVER THE COUNTER MEDICATION Take 1 tablet by mouth daily. Life Extension, Brain Support Supplement.   Potassium 99 MG Tabs Take 99 mg by mouth daily.   Salonpas 3.06-06-08 % Ptch Generic drug: Camphor-Menthol-Methyl Sal Place 1 patch onto the skin daily as needed (pain).   vitamin B-12 500 MCG tablet Commonly known as: CYANOCOBALAMIN Take 500 mcg by mouth daily.   Vitamin D 50 MCG (2000 UT) Caps Take 2,000 Units by mouth daily.       Past Surgical History:  She  has a past surgical history that includes Appendectomy; Hernia repair; and CT guided lung biopsy.  Family History:  Her family history is not on file.  Social History:  She  reports that she has never smoked. She has never used smokeless tobacco. She reports that she does not drink alcohol or use drugs.

## 2019-04-03 ENCOUNTER — Telehealth: Payer: Self-pay | Admitting: Pulmonary Disease

## 2019-04-03 ENCOUNTER — Telehealth: Payer: Self-pay | Admitting: Radiation Oncology

## 2019-04-03 MED ORDER — MOMETASONE FURO-FORMOTEROL FUM 200-5 MCG/ACT IN AERO: 2.0000 | INHALATION_SPRAY | Freq: Two times a day (BID) | RESPIRATORY_TRACT | 5 refills | Status: DC

## 2019-04-03 NOTE — Telephone Encounter (Signed)
Spoke with the pt's daughter, [name]  She states that she is needing rx for [facility]  Rx sent  Daughter states there is nothing further needed

## 2019-04-03 NOTE — Telephone Encounter (Signed)
Received voicemail from daughter, [name], requesting a return call. Phoned back to inquire. She reports her mother was prescribed Nitrofurantoin over the weekend for a suspected UTI. She questions if the antibiotics will effect her mother's ability to start SBRT treatment tomorrow. Explained the antibiotics will have no effect on her mother's lung treatments. She reports her mother has begun to spit up small amounts of sticky white sputum with an increased frequency of cough. Encouraged Robitussin DM. Confirmed treatment time tomorrow. Encouraged arrival 15 minutes early for check in. Reminded daughter that visitor restrictions remain in place. Assured her that if her mother needs oxygen while receiving radiation therapy it will be provided. She verbalized understanding of all reviewed and denies further needs at this time.

## 2019-04-04 ENCOUNTER — Ambulatory Visit
Admission: RE | Admit: 2019-04-04 | Discharge: 2019-04-04 | Disposition: A | Discharge status: Home / Self Care | Payer: Medicare HMO | Source: Ambulatory Visit | Attending: Radiation Oncology | Admitting: Radiation Oncology

## 2019-04-04 ENCOUNTER — Telehealth: Payer: Self-pay | Admitting: Radiation Oncology

## 2019-04-04 ENCOUNTER — Other Ambulatory Visit: Payer: Self-pay

## 2019-04-04 DIAGNOSIS — C3432 Malignant neoplasm of lower lobe, left bronchus or lung: Secondary | ICD-10-CM | POA: Diagnosis not present

## 2019-04-04 NOTE — Telephone Encounter (Signed)
Received voicemail message from patient's daughter, [name], explaining she is at the [place] office with her mother. She wants to ensure its safe for her mother to have Reclast while receiving radiation therapy. Phoned [name] back promptly. Explained that per Freeman Caldron, PA-C it is fine from a radiation standpoint for her mother to have Reclast. [name] verbalized understanding and expressed appreciation for the call.

## 2019-04-05 ENCOUNTER — Telehealth: Payer: Self-pay | Admitting: Pulmonary Disease

## 2019-04-05 MED ORDER — AEROCHAMBER MV MISC: 0 refills | Status: DC

## 2019-04-05 NOTE — Telephone Encounter (Signed)
Called and spoke with pt's daughter Sandra Cooper who stated Sandra Cooper was requiring a prior authorization. Stated to pt once pharmacy sends [place] the request for hte PA to be done, we would get it taken care of for pt and then call her and pharmacy letting her know it had been handled. Sandra Cooper asked that we call her brother Sandra Cooper [facility]) at number listed as she will be heading home to [place].  I stated to [name] we would. Routing to myself so I can keep an eye out for PA from pharmacy so we can get it taken care of for pt.

## 2019-04-06 ENCOUNTER — Ambulatory Visit
Admission: RE | Admit: 2019-04-06 | Discharge: 2019-04-06 | Disposition: A | Discharge status: Home / Self Care | Payer: Medicare HMO | Source: Ambulatory Visit | Attending: Radiation Oncology | Admitting: Radiation Oncology

## 2019-04-06 ENCOUNTER — Encounter: Payer: Self-pay | Admitting: General Surgery

## 2019-04-06 ENCOUNTER — Telehealth: Payer: Self-pay | Admitting: General Surgery

## 2019-04-06 ENCOUNTER — Other Ambulatory Visit: Payer: Self-pay

## 2019-04-06 DIAGNOSIS — C3432 Malignant neoplasm of lower lobe, left bronchus or lung: Secondary | ICD-10-CM | POA: Diagnosis not present

## 2019-04-06 NOTE — Telephone Encounter (Signed)
Fax received from [facility] stating the [facility] 200-1mcg is not covered by patient insurance. Alternative medication needs to be sent to pharmacy.

## 2019-04-10 NOTE — Telephone Encounter (Signed)
Fax from pt's pharmacy was received. There was another encounter opened dated 11/5 in regards to the [facility]. Closing this encounter. Please refer to other open encounter from 04/06/2019.

## 2019-04-10 NOTE — Telephone Encounter (Signed)
Has this PA been received?

## 2019-04-11 ENCOUNTER — Other Ambulatory Visit: Payer: Self-pay

## 2019-04-11 ENCOUNTER — Telehealth: Payer: Self-pay | Admitting: *Deleted

## 2019-04-11 ENCOUNTER — Ambulatory Visit
Admission: RE | Admit: 2019-04-11 | Discharge: 2019-04-11 | Disposition: A | Discharge status: Home / Self Care | Payer: Medicare HMO | Source: Ambulatory Visit | Attending: Radiation Oncology | Admitting: Radiation Oncology

## 2019-04-11 ENCOUNTER — Encounter: Payer: Self-pay | Admitting: Urology

## 2019-04-11 DIAGNOSIS — C3432 Malignant neoplasm of lower lobe, left bronchus or lung: Secondary | ICD-10-CM | POA: Diagnosis not present

## 2019-04-11 NOTE — Progress Notes (Signed)
Received patient in the clinic following final SBRT treatment. Patient is sitting in a wheelchair. Patient's son, [name], is on speaker phone actively engaged in the conversation. BP slightly elevated. Patient reports waking this morning with severe pain in her legs following PT yesterday. Patient reports taking Tylenol and obtaining relief. Also, patient has chronic low back and hip pain for which she is scheduled to receive an injection on 04/17/2019. Patient reports an occasional cough if she eats or talks a lot. Patient reports a small amount of clear to white phlegm is produced. She endorses difficulty swallowing but denies pain. She reports SOB with exertion only. One month follow up appointment card given.   BP (!) 150/91   Pulse 68   Temp 98.7 F (37.1 C)   Resp 17   Wt 111 lb 3.2 oz (50.4 kg)   SpO2 98%   BMI 22.46 kg/m  Wt Readings from Last 3 Encounters:  04/11/19 111 lb 3.2 oz (50.4 kg)  03/28/19 113 lb 6.4 oz (51.4 kg)  03/27/19 115 lb 9.6 oz (52.4 kg)

## 2019-04-11 NOTE — Telephone Encounter (Signed)
Will hold in triage and call in AM due to time.

## 2019-04-11 NOTE — Telephone Encounter (Signed)
I called son and he stated he was on another line and would call back. Will await return call.

## 2019-04-11 NOTE — Progress Notes (Addendum)
  Radiation Oncology         (336) [phone] ________________________________  Name: Sandra Cooper MRN: [id number]  Date: 04/11/2019  DOB: Jun 26, 1929  End of Treatment Note  Diagnosis:   85 yo woman with clinical stage IA2 adenocarcinoma of the left lower lung     Indication for treatment:  Curative, Definitive SBRT       Radiation treatment dates:   04/04/19, 04/06/19, 04/11/19  Site/dose:   The target was treated to 54 Gy in 3 fractions of 18 Gy each.  Beams/energy:   The patient was treated using stereotactic body radiotherapy according to a 3D conformal radiotherapy plan.  Volumetric arc fields were employed to deliver 6 MV X-rays.  Image guidance was performed with per fraction cone beam CT prior to treatment under personal MD supervision.  Immobilization was achieved using BodyFix Pillow.  Narrative: The patient tolerated radiation treatment relatively well.   She did not experience any significant side effects.  She denies productive cough, hemoptysis, dysphagia, increased shortness of breath or chest pain.  She is maintaining her weight and has a healthy appetite.  She denies any noticeable fatigue and overall feels well in general.  Plan: The patient has completed radiation treatment. She understands that we will conduct her routine post-treatment follow up by phone in one month, with plans to obtain a follow up CT Chest scan at 6 weeks and again at 4 months.  I will call her son, [name], with the results of each study. Pending those scans are stable, we will then proceed with serial CT Chest scans every 6 months with a follow up by phone thereafter to review results and recommendations.  I advised them to call or return sooner if they have any questions or concerns related to their recovery or treatment. ________________________________  Sheral Apley. Tammi Klippel, M.D.

## 2019-04-11 NOTE — Telephone Encounter (Signed)
Spoke with pt's son and he states the [place] is not covered.. I called the pharmacy and they stated it wasn't covered not even with a PA. They stated there was alternatives but didn't list them for the pharmacist. What other medications are similar to [facility]? Could we send in an alternative and see if it goes through her [facility] plan? Please advise.    [phone]

## 2019-04-11 NOTE — Telephone Encounter (Signed)
Alternatives to dulera would be advair, wixela, symbicort, or breo.  Please check which is approved by her insurance.

## 2019-04-11 NOTE — Telephone Encounter (Signed)
Son has called back regarding previous messages and states patient is now out of medication. CB is [phone].

## 2019-04-11 NOTE — Telephone Encounter (Signed)
CALLED PATIENT'S SON OMAR TO INFORM OF FU APPT. WITH ASHLYN BRUNING ON 05-11-19  @ 10 AM, SPOKE WITH PATIENT'S SON OMAR AND HE AGREED TO THIS APPT. DAY AND TIME, AND HE STATED THAT HE WOULD BE WITH HIS MOM ON THIS DAY

## 2019-04-12 NOTE — Telephone Encounter (Signed)
Per triage protocol, message will be routed back to [facility] as this message did not originate in triage.

## 2019-04-13 ENCOUNTER — Other Ambulatory Visit: Payer: Self-pay | Admitting: General Surgery

## 2019-04-13 DIAGNOSIS — J9611 Chronic respiratory failure with hypoxia: Secondary | ICD-10-CM

## 2019-04-13 DIAGNOSIS — J4540 Moderate persistent asthma, uncomplicated: Secondary | ICD-10-CM

## 2019-04-13 MED ORDER — BUDESONIDE-FORMOTEROL FUMARATE 160-4.5 MCG/ACT IN AERO: 2.0000 | INHALATION_SPRAY | Freq: Two times a day (BID) | RESPIRATORY_TRACT | 6 refills | Status: DC

## 2019-04-13 NOTE — Telephone Encounter (Signed)
Called pharmacy and confirmed Symbicort 160 would be an alternative for the patient. Asked Dr. Elsworth Soho (was covering him for Clinic to confirm and agreed). Prescription sent to pharmacy.  Called patient son and advised of prescription sent. He voiced understanding. Nothing further needed a this time.

## 2019-04-13 NOTE — Telephone Encounter (Signed)
Pt's son is calling back regarding the conversation about an alternative to the [place]. Please advise which inhaler she can use and have a RX sent to [facility] on [address] number [phone] - Pt is out completely out of inhaler - please advise

## 2019-04-17 ENCOUNTER — Ambulatory Visit: Payer: Medicare HMO | Admitting: Oncology

## 2019-04-21 ENCOUNTER — Telehealth: Payer: Self-pay | Admitting: Pulmonary Disease

## 2019-04-21 DIAGNOSIS — R0602 Shortness of breath: Secondary | ICD-10-CM

## 2019-04-21 NOTE — Telephone Encounter (Signed)
Called and relayed results per Dr. Halford Chessman.  Verbalized understanding. Nothing further needed at this time.

## 2019-04-21 NOTE — Telephone Encounter (Signed)
ONO with RA 04/06/19 >> test time 8 hrs 34 min.  Baseline SpO2 92%, low SpO2 87%.  Spent 1 min 40 sec with SpO2 < 88%.   Please let her know her oxygen level looked good.  She no longer needs to use supplemental oxygen at night.  Please send order to discontinue home oxygen set up.

## 2019-05-11 ENCOUNTER — Ambulatory Visit
Admission: RE | Admit: 2019-05-11 | Discharge: 2019-05-11 | Disposition: A | Discharge status: Home / Self Care | Payer: Medicare HMO | Source: Ambulatory Visit | Attending: Urology | Admitting: Urology

## 2019-05-11 DIAGNOSIS — C3432 Malignant neoplasm of lower lobe, left bronchus or lung: Secondary | ICD-10-CM

## 2019-05-11 NOTE — Progress Notes (Signed)
Radiation Oncology         (336) [phone] ________________________________  Name: Sandra Cooper MRN: [id number]  Date: 05/11/2019  DOB: [birth year]  Post Treatment Note  CC: [name], MD  [name], MD  Diagnosis:   85 yo woman with clinical stage IA2 adenocarcinoma of the left lower lung.  Interval Since Last Radiation:  4 weeks  04/04/19, 04/06/19, 04/11/19// SBRT: The target was treated to 54 Gy in 3 fractions of 18 Gy each.  Narrative:  I spoke with the patient, on a conference call with her adult son, Sandra Cooper, who serves as interpreter for her, to conduct her routine scheduled 1 month follow up visit via telephone to spare the patient unnecessary potential exposure in the healthcare setting during the current COVID-19 pandemic.  The patient was notified in advance and gave permission to proceed with this visit format. She tolerated radiation treatment relatively well without any significant side effects.  She denied productive cough, hemoptysis, dysphagia, increased shortness of breath or chest pain.  She was able to maintain her weight and continued with a healthy appetite.  She denied any noticeable fatigue.  On review of systems, the patient states that she is doing very well overall.  She denies any adverse side effects associated with the radiation and specifically denies chest pain, increased shortness of breath, productive cough, hemoptysis, fever, chills or night sweats.  She continues with mild fatigue but feels that this is gradually improving.  More recently, she has been able to resume her exercise routine which she is quite pleased with.  She reports a healthy appetite and is maintaining her weight.  Overall, she is quite pleased with her progress to date.  ALLERGIES:  is allergic to fluoxetine.  Meds: Current Outpatient Medications  Medication Sig Dispense Refill  . acetaminophen (TYLENOL) 325 MG tablet Take 2 tablets (650 mg total) by mouth every 6 (six) hours as  needed for mild pain (or Fever >/= 101).    Marland Kitchen albuterol (VENTOLIN HFA) 108 (90 Base) MCG/ACT inhaler Inhale 2 puffs into the lungs every 6 (six) hours as needed for wheezing or shortness of breath. 6.7 g 2  . alum & mag hydroxide-simeth (MAALOX/MYLANTA) 200-200-20 MG/5ML suspension Take 15 mLs by mouth every 6 (six) hours as needed for indigestion or heartburn. 355 mL 0  . amLODipine (NORVASC) 5 MG tablet Take 1 tablet (5 mg total) by mouth daily. 30 tablet 0  . budesonide-formoterol (SYMBICORT) 160-4.5 MCG/ACT inhaler Inhale 2 puffs into the lungs 2 (two) times daily. 1 Inhaler 6  . Calcium Carb-Cholecalciferol (CALCIUM-VITAMIN D) 600-400 MG-UNIT TABS Take 1 tablet by mouth daily.    . Camphor-Menthol-Methyl Sal (SALONPAS) 3.06-06-08 % PTCH Place 1 patch onto the skin daily as needed (pain).    . Cholecalciferol (VITAMIN D) 50 MCG (2000 UT) CAPS Take 2,000 Units by mouth daily.    Marland Kitchen DHA-Vitamin C-Lutein (EYE HEALTH FORMULA PO) Take 1 tablet by mouth daily.    Marland Kitchen losartan (COZAAR) 100 MG tablet Take 100 mg by mouth every evening.    . magnesium gluconate (MAGONATE) 500 MG tablet Take 1,000 mg by mouth daily.     . mirtazapine (REMERON) 15 MG tablet Take 15 mg by mouth at bedtime.    . Multiple Vitamin (MULTIVITAMIN) tablet Take 1 tablet by mouth daily.    . nitrofurantoin (MACRODANTIN) 100 MG capsule Take 100 mg by mouth 2 (two) times daily.    . Omega-3-6-9 CAPS Take 1 capsule by mouth 2 (  two) times daily.     Marland Kitchen OVER THE COUNTER MEDICATION Take 1 tablet by mouth daily. Life Extension, Brain Support Supplement.    . Potassium 99 MG TABS Take 99 mg by mouth daily.    Marland Kitchen Spacer/Aero-Holding Chambers (AEROCHAMBER MV) inhaler Use as instructed 1 each 0  . vitamin B-12 (CYANOCOBALAMIN) 500 MCG tablet Take 500 mcg by mouth daily.     No current facility-administered medications for this encounter.    Physical Findings:  vitals were not taken for this visit.   /Unable to assess due to telephone  follow-up visit format.  Lab Findings: Lab Results  Component Value Date   WBC 8.5 03/27/2019   HGB 11.8 (L) 03/27/2019   HCT 36.6 03/27/2019   MCV 89.7 03/27/2019   PLT 325 03/27/2019     Radiographic Findings: No results found.  Impression/Plan: 3. 85 yo woman with clinical stage IA2 adenocarcinoma of the left lower lung. She appears to have recovered well from the effects of her recent radiotherapy and is currently without complaints.  We discussed the plan to proceed with a posttreatment CT chest in the next 2 weeks to assess her treatment response and as long as this scan appears stable, we will move forward, as planned, with serial CT chest scans every 6 months thereafter to continue to monitor for any evidence of disease recurrence or progression under the care and direction of Dr. Julien Nordmann.  I will call her son, Sandra Cooper, with those results as soon as available.  We discussed that while we are happy to continue to participate in her care if clinically indicated, at this point, we will plan to see her back on an as-needed basis.  She is currently scheduled for a follow up visit with Dr. Julien Nordmann on 09/25/19, following a repeat CT chest scan to review results and recommendations and will continue in routine follow up under his care going forward.  She and her son appear to have a good understanding of these recommendations and are comfortable and in agreement with the stated plan.  They know to call at anytime with any questions or concerns in the interim.    Nicholos Johns, PA-C

## 2019-05-25 ENCOUNTER — Ambulatory Visit (HOSPITAL_COMMUNITY): Payer: Medicare HMO

## 2019-05-25 ENCOUNTER — Ambulatory Visit: Payer: Medicare HMO

## 2019-05-28 ENCOUNTER — Telehealth: Payer: Self-pay | Admitting: Urology

## 2019-05-28 NOTE — Telephone Encounter (Signed)
Per patient's son, Sandra Cooper, Sandra Cooper wishes to delay her post treatent CT scan until after she has had the COVID-19 vaccine as to avoid any unnecessary exposure in the healthcare setting.  This is reasonable and has been communicated with her son, Sandra Cooper who will relay the information to her. At this point, they are still interested in having a repeat scan prior to April and will call us back to rescheduled labs and imaging once she has had her vaccine.  We will share the imaging results with Dr. Julien Cooper and keep him informed in case he wishes to postpone further scans pending timing of the post-treatment scan so that we are not duplicating efforts.  Sandra Cooper, MMS, PA-C [facility]: [phone]  Fax: [phone]

## 2019-06-01 ENCOUNTER — Ambulatory Visit: Payer: Self-pay | Admitting: Urology

## 2019-06-12 ENCOUNTER — Telehealth: Payer: Self-pay | Admitting: *Deleted

## 2019-06-12 NOTE — Telephone Encounter (Signed)
Records faxed to [facility] - release [id number]

## 2019-06-22 ENCOUNTER — Ambulatory Visit: Payer: Medicare HMO | Admitting: Pulmonary Disease

## 2019-06-27 ENCOUNTER — Ambulatory Visit: Payer: Medicare HMO

## 2019-07-06 ENCOUNTER — Ambulatory Visit: Payer: Self-pay

## 2019-07-13 ENCOUNTER — Ambulatory Visit: Payer: Medicare HMO | Admitting: Pulmonary Disease

## 2019-08-03 ENCOUNTER — Telehealth: Payer: Self-pay | Admitting: *Deleted

## 2019-08-03 NOTE — Telephone Encounter (Signed)
CALLED PATIENT TO INFORM OF STAT LABS ON 08-16-19 @ 12:15 PM @ [place] CT ON 08-16-19 - ARRIVAL TIME- 1:15 PM @[facility]  RADIOLOGY, PT. TO HAVE WATER ONLY- 4 HRS. PRIOR TO TEST, PATIENT TO RECEIVE RESULTS FROM ASHLYN BRUNING ON 08-23-19 @ 2  PM VIA TELEPHONE, SPOKE WITH PATIENT'S SON - OMAR AND HE IS AWARE OF THESE APPTS.

## 2019-08-16 ENCOUNTER — Other Ambulatory Visit: Payer: Self-pay

## 2019-08-16 ENCOUNTER — Ambulatory Visit (HOSPITAL_COMMUNITY)
Admission: RE | Admit: 2019-08-16 | Discharge: 2019-08-16 | Disposition: A | Discharge status: Home / Self Care | Payer: Medicare HMO | Source: Ambulatory Visit | Attending: Urology | Admitting: Urology

## 2019-08-16 ENCOUNTER — Ambulatory Visit
Admission: RE | Admit: 2019-08-16 | Discharge: 2019-08-16 | Disposition: A | Discharge status: Home / Self Care | Payer: Medicare HMO | Source: Ambulatory Visit | Attending: Urology | Admitting: Urology

## 2019-08-16 ENCOUNTER — Other Ambulatory Visit: Payer: Self-pay | Admitting: Radiation Oncology

## 2019-08-16 DIAGNOSIS — C3432 Malignant neoplasm of lower lobe, left bronchus or lung: Secondary | ICD-10-CM | POA: Insufficient documentation

## 2019-08-16 LAB — BUN & CREATININE (CHCC)
BUN: 13 mg/dL (ref 8–23)
Creatinine: 0.73 mg/dL (ref 0.44–1.00)
GFR, Est AFR Am: >60 mL/min (ref >60)
GFR, Estimated: >60 mL/min (ref >60)

## 2019-08-16 MED ORDER — SODIUM CHLORIDE (PF) 0.9 % IJ SOLN
INTRAMUSCULAR | Status: AC
  Filled 2019-08-16: qty 50

## 2019-08-16 MED ORDER — IOHEXOL 300 MG/ML  SOLN
75.0000 mL | Freq: Once | INTRAMUSCULAR | Status: AC | PRN
  Administered 2019-08-16: 75 mL via INTRAVENOUS

## 2019-08-22 ENCOUNTER — Other Ambulatory Visit: Payer: Self-pay

## 2019-08-23 ENCOUNTER — Ambulatory Visit
Admission: RE | Admit: 2019-08-23 | Discharge: 2019-08-23 | Disposition: A | Discharge status: Home / Self Care | Payer: Medicare HMO | Source: Ambulatory Visit | Attending: Urology | Admitting: Urology

## 2019-08-23 ENCOUNTER — Encounter: Payer: Self-pay | Admitting: Urology

## 2019-08-23 ENCOUNTER — Other Ambulatory Visit: Payer: Self-pay

## 2019-08-23 DIAGNOSIS — C3432 Malignant neoplasm of lower lobe, left bronchus or lung: Secondary | ICD-10-CM

## 2019-08-23 NOTE — Progress Notes (Addendum)
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  Interval Since Last Radiation:  4 weeks  04/04/19, 04/06/19, 04/11/19// SBRT: The target was treated to 54 Gy in 3 fractions of 18 Gy each.  Narrative:  I spoke with the patient, on a conference call with her adult son, [name], who serves as interpreter for her, to conduct her routine scheduled 3 month follow up visit to review the results of her recent post-treatment CT Chest scan from 08/16/19 via telephone to spare the patient unnecessary potential exposure in the healthcare setting during the current COVID-19 pandemic.  The patient was notified in advance and gave permission to proceed with this visit format. She tolerated radiation treatment relatively well without any significant side effects.  Her CT Chest performed on 08/16/19 shows a good response to treatment without evidence of active disease or metastasis.  On review of systems, the patient states that she is doing very well overall.  She denies any adverse side effects associated with the radiation and specifically denies chest pain, increased shortness of breath, productive cough, hemoptysis, fever, chills or night sweats.  She reports resolution of the mild fatigue that was noted at the end of treatment.  She has been able to resume her exercise routine which she is quite pleased with.  She reports a healthy appetite and is maintaining her weight.  Overall, she is quite pleased with her progress to date.  ALLERGIES:  is allergic to fluoxetine.  Meds: Current Outpatient Medications  Medication Sig Dispense Refill  . acetaminophen (TYLENOL) 325 MG tablet Take 2 tablets (650 mg total) by mouth every 6 (six) hours as needed for mild pain  (or Fever >/= 101).    Marland Kitchen albuterol (VENTOLIN HFA) 108 (90 Base) MCG/ACT inhaler Inhale 2 puffs into the lungs every 6 (six) hours as needed for wheezing or shortness of breath. 6.7 g 2  . alum & mag hydroxide-simeth (MAALOX/MYLANTA) 200-200-20 MG/5ML suspension Take 15 mLs by mouth every 6 (six) hours as needed for indigestion or heartburn. 355 mL 0  . amLODipine (NORVASC) 5 MG tablet Take 1 tablet (5 mg total) by mouth daily. 30 tablet 0  . budesonide-formoterol (SYMBICORT) 160-4.5 MCG/ACT inhaler Inhale 2 puffs into the lungs 2 (two) times daily. 1 Inhaler 6  . Calcium Carb-Cholecalciferol (CALCIUM-VITAMIN D) 600-400 MG-UNIT TABS Take 1 tablet by mouth daily.    . Camphor-Menthol-Methyl Sal (SALONPAS) 3.06-06-08 % PTCH Place 1 patch onto the skin daily as needed (pain).    . Cholecalciferol (VITAMIN D) 50 MCG (2000 UT) CAPS Take 2,000 Units by mouth daily.    Marland Kitchen DHA-Vitamin C-Lutein (EYE HEALTH FORMULA PO) Take 1 tablet by mouth daily.    Marland Kitchen losartan (COZAAR) 100 MG tablet Take 100 mg by mouth every evening.    . magnesium gluconate (MAGONATE) 500 MG tablet Take 1,000 mg by mouth daily.     . mirtazapine (REMERON) 15 MG tablet Take 15 mg by mouth at bedtime.    . Multiple Vitamin (MULTIVITAMIN) tablet Take 1 tablet by mouth daily.    . Omega-3-6-9 CAPS Take 1 capsule by mouth 2 (two) times daily.     Marland Kitchen OVER THE COUNTER MEDICATION Take 1 tablet by mouth daily.  Life Extension, Brain Support Supplement.    . Potassium 99 MG TABS Take 99 mg by mouth daily.    Marland Kitchen Spacer/Aero-Holding Chambers (AEROCHAMBER MV) inhaler Use as instructed 1 each 0  . vitamin B-12 (CYANOCOBALAMIN) 500 MCG tablet Take 500 mcg by mouth daily.    . nitrofurantoin (MACRODANTIN) 100 MG capsule Take 100 mg by mouth 2 (two) times daily.     No current facility-administered medications for this encounter.    Physical Findings:  vitals were not taken for this visit.   /Unable to assess due to telephone follow-up visit  format.  Lab Findings: Lab Results  Component Value Date   WBC 8.5 03/27/2019   HGB 11.8 (L) 03/27/2019   HCT 36.6 03/27/2019   MCV 89.7 03/27/2019   PLT 325 03/27/2019     Radiographic Findings: CT Chest W Contrast  Result Date: 08/16/2019 CLINICAL DATA:  Non-small cell lung cancer. Assess treatment response. Receiving radiation therapy. EXAM: CT CHEST WITH CONTRAST TECHNIQUE: Multidetector CT imaging of the chest was performed during intravenous contrast administration. CONTRAST:  17mL OMNIPAQUE IOHEXOL 300 MG/ML  SOLN COMPARISON:  01/31/2019. FINDINGS: Cardiovascular: Atherosclerotic calcification of the aorta, aortic valve and coronary arteries. Heart is mildly enlarged. No pericardial effusion. Mediastinum/Nodes: Low-attenuation left thyroid nodule measures 2.1 cm, as before. No pathologically enlarged mediastinal, hilar or axillary lymph nodes. Esophagus is grossly unremarkable. Lungs/Pleura: Spiculated left lower lobe nodule appears similar in size, currently measuring 1.6 x 1.6 cm (7/81), compared to 1.4 x 1.5 cm when remeasured in a similar fashion on 01/31/2019. There is largely surrounding ground-glass, consolidation and mild architectural distortion in the lingula and left lower lobe. Mild volume loss in the right middle lobe and right lower lobe. No pleural fluid. Airway is unremarkable. Upper Abdomen: Liver margin is irregular. Low-attenuation lesions in the liver measure up to 2.1 cm in the left hepatic lobe as before and are likely cysts. Visualized portions of the liver and adrenal glands are otherwise unremarkable. Subcentimeter low-attenuation lesions in the kidneys are too small to characterize but statistically, cysts are likely. Spleen and visualized portions of the pancreas, stomach and bowel are grossly unremarkable. No upper abdominal adenopathy. Musculoskeletal: No worrisome lytic or sclerotic lesions. Degenerative changes in the spine. T3 and T6 compression deformities are  unchanged. IMPRESSION: 1. Left lower lobe nodule is similar in size with surrounding evidence of radiation therapy. No evidence of metastatic disease. 2. Cirrhosis. 3. Left thyroid nodule previously evaluated by ultrasound of 02/15/2019. 4. Aortic atherosclerosis (ICD10-I70.0). Coronary artery calcification. Electronically Signed   By: Lorin Picket M.D.   On: 08/16/2019 15:26    Impression/Plan: 67. 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 She appears to have recovered well from the effects of her recent radiotherapy and is currently without complaints.  We reviewed the results from her recent follow up CT Chest performed on 08/16/19 which shows disease stability and no evidence of progression. We discussed the plan to proceed in routine follow up with Dr. Julien Nordmann going forward and anticipate serial CT chest scans every 3-6 months to continue to monitor for any evidence of disease recurrence or progression. We discussed that while we are happy to continue to participate in her care if clinically indicated, at this point, we will plan to see her back on an as-needed basis.  She is currently scheduled for a follow up visit with Dr. Julien Nordmann on 09/25/19 and will continue in routine follow up under his care going  forward.  She and her son, [name], appear to have a good understanding of these recommendations and are comfortable and in agreement with the stated plan.  They know to call at anytime with any questions or concerns in the interim.  Given current concerns for patient exposure during the COVID-19 pandemic, this encounter was conducted via telephone. The patient was notified in advance and was offered a [place] meeting to allow for face to face communication but unfortunately reported that she did not have the appropriate resources/technology to support such a visit and instead preferred to proceed with telephone consult. The patient has given verbal consent for this type of  encounter. The time spent during this encounter was 20 minutes. The attendants for this meeting include Raechal Raben PA-C, patient, Deneise Getty and her adult son, [name]. During the encounter, Josefa Syracuse PA-C, was located at [facility] Radiation Oncology Department.  Patient, Laverne Hursey and son, [name], were located at home.    Nicholos Johns, PA-C

## 2019-08-29 ENCOUNTER — Telehealth: Payer: Self-pay | Admitting: Internal Medicine

## 2019-08-29 NOTE — Telephone Encounter (Signed)
Scheduled appt per 3/30 sch message - pt daughter aware of appt

## 2019-09-04 ENCOUNTER — Encounter: Payer: Self-pay | Admitting: Internal Medicine

## 2019-09-04 ENCOUNTER — Other Ambulatory Visit: Payer: Self-pay

## 2019-09-04 ENCOUNTER — Inpatient Hospital Stay: Payer: Medicare HMO | Attending: Internal Medicine | Admitting: Internal Medicine

## 2019-09-04 ENCOUNTER — Inpatient Hospital Stay: Payer: Medicare HMO

## 2019-09-04 VITALS — BP 130/72 | HR 70 | Temp 98.9°F | Resp 18 | Ht 59.0 in | Wt 113.1 lb

## 2019-09-04 DIAGNOSIS — C3490 Malignant neoplasm of unspecified part of unspecified bronchus or lung: Secondary | ICD-10-CM

## 2019-09-04 DIAGNOSIS — J45909 Unspecified asthma, uncomplicated: Secondary | ICD-10-CM | POA: Insufficient documentation

## 2019-09-04 DIAGNOSIS — K7460 Unspecified cirrhosis of liver: Secondary | ICD-10-CM | POA: Insufficient documentation

## 2019-09-04 DIAGNOSIS — I700 Atherosclerosis of aorta: Secondary | ICD-10-CM | POA: Insufficient documentation

## 2019-09-04 DIAGNOSIS — I10 Essential (primary) hypertension: Secondary | ICD-10-CM

## 2019-09-04 DIAGNOSIS — Z7951 Long term (current) use of inhaled steroids: Secondary | ICD-10-CM | POA: Diagnosis not present

## 2019-09-04 DIAGNOSIS — E041 Nontoxic single thyroid nodule: Secondary | ICD-10-CM | POA: Insufficient documentation

## 2019-09-04 DIAGNOSIS — Z923 Personal history of irradiation: Secondary | ICD-10-CM | POA: Diagnosis not present

## 2019-09-04 DIAGNOSIS — Z79899 Other long term (current) drug therapy: Secondary | ICD-10-CM | POA: Diagnosis not present

## 2019-09-04 DIAGNOSIS — M1990 Unspecified osteoarthritis, unspecified site: Secondary | ICD-10-CM | POA: Insufficient documentation

## 2019-09-04 DIAGNOSIS — C3432 Malignant neoplasm of lower lobe, left bronchus or lung: Secondary | ICD-10-CM | POA: Diagnosis present

## 2019-09-04 LAB — CMP (CANCER CENTER ONLY)
ALT: 27 U/L (ref 0–44)
AST: 24 U/L (ref 15–41)
Albumin: 3.4 g/dL — ABNORMAL LOW (ref 3.5–5.0)
Alkaline Phosphatase: 80 U/L (ref 38–126)
Anion gap: 11 (ref 5–15)
BUN: 25 mg/dL — ABNORMAL HIGH (ref 8–23)
CO2: 24 mmol/L (ref 22–32)
Calcium: 9 mg/dL (ref 8.9–10.3)
Chloride: 107 mmol/L (ref 98–111)
Creatinine: 0.83 mg/dL (ref 0.44–1.00)
GFR, Est AFR Am: >60 mL/min (ref >60)
GFR, Estimated: >60 mL/min (ref >60)
Glucose, Bld: 131 mg/dL — ABNORMAL HIGH (ref 70–99)
Potassium: 3.5 mmol/L (ref 3.5–5.1)
Sodium: 142 mmol/L (ref 135–145)
Total Bilirubin: 0.4 mg/dL (ref 0.3–1.2)
Total Protein: 6.6 g/dL (ref 6.5–8.1)

## 2019-09-04 LAB — CBC WITH DIFFERENTIAL (CANCER CENTER ONLY)
Abs Immature Granulocytes: 0.02 10*3/uL (ref 0.00–0.07)
Basophils Absolute: 0 10*3/uL (ref 0.0–0.1)
Basophils Relative: 1 %
Eosinophils Absolute: 0.2 10*3/uL (ref 0.0–0.5)
Eosinophils Relative: 3 %
HCT: 41.1 % (ref 36.0–46.0)
Hemoglobin: 12.7 g/dL (ref 12.0–15.0)
Immature Granulocytes: 0 %
Lymphocytes Relative: 26 %
Lymphs Abs: 1.4 10*3/uL (ref 0.7–4.0)
MCH: 27 pg (ref 26.0–34.0)
MCHC: 30.9 g/dL (ref 30.0–36.0)
MCV: 87.4 fL (ref 80.0–100.0)
Monocytes Absolute: 0.5 10*3/uL (ref 0.1–1.0)
Monocytes Relative: 9 %
Neutro Abs: 3.4 10*3/uL (ref 1.7–7.7)
Neutrophils Relative %: 61 %
Platelet Count: 310 10*3/uL (ref 150–400)
RBC: 4.7 MIL/uL (ref 3.87–5.11)
RDW: 16.4 % — ABNORMAL HIGH (ref 11.5–15.5)
WBC Count: 5.5 10*3/uL (ref 4.0–10.5)
nRBC: 0 % (ref 0.0–0.2)

## 2019-09-04 NOTE — Progress Notes (Signed)
[facility] [phone]   Fax:([phone]  OFFICE PROGRESS NOTE  [name], MD [place] [address]  DIAGNOSIS:  stage Ia (T1c, N0, M0) non-small cell lung cancer, adenocarcinoma presented with left lower lobe lung nodule diagnosed in October 2020. The patient is not a good surgical candidate for resection.  She has a complicated event after her CT-guided core biopsy with severe respiratory failure.  PRIOR THERAPY: Status post curative SBRT to the left lower lobe lung nodule under the care of Dr. Tammi Klippel completed on April 11, 2019.  CURRENT THERAPY: Observation.  INTERVAL HISTORY: Sandra Cooper 86 y.o. female returns to the clinic today for follow-up visit accompanied by her son.  The patient is feeling fine today with no concerning complaints except for occasional left-sided chest pain after she pulled a muscle in that area.  She takes Aleve twice daily for the pain.  She denied having any shortness of breath, cough or hemoptysis.  She denied having any fever or chills.  She has no nausea, vomiting, diarrhea or constipation.  She denied having any headache or visual changes.  The patient had repeat CT scan of the chest performed recently and she is here for evaluation and discussion of her risk her results.  MEDICAL HISTORY: Past Medical History:  Diagnosis Date  . Arthritis   . Asthma   . Hypertension   . Lung cancer ([place])    adenocarcinoma L[facility]    ALLERGIES:  is allergic to fluoxetine.  MEDICATIONS:  Current Outpatient Medications  Medication Sig Dispense Refill  . albuterol (VENTOLIN HFA) 108 (90 Base) MCG/ACT inhaler Inhale 2 puffs into the lungs every 6 (six) hours as needed for wheezing or shortness of breath. 6.7 g 2  . amLODipine (NORVASC) 5 MG tablet Take 1 tablet (5 mg total) by mouth daily. 30 tablet 0  . budesonide-formoterol (SYMBICORT) 160-4.5 MCG/ACT inhaler Inhale 2 puffs into the lungs 2 (two) times  daily. 1 Inhaler 6  . Calcium Carb-Cholecalciferol (CALCIUM-VITAMIN D) 600-400 MG-UNIT TABS Take 1 tablet by mouth daily.    . Camphor-Menthol-Methyl Sal (SALONPAS) 3.06-06-08 % PTCH Place 1 patch onto the skin daily as needed (pain).    . Cholecalciferol (VITAMIN D) 50 MCG (2000 UT) CAPS Take 2,000 Units by mouth daily.    Marland Kitchen DHA-Vitamin C-Lutein (EYE HEALTH FORMULA PO) Take 1 tablet by mouth daily.    Marland Kitchen losartan (COZAAR) 100 MG tablet Take 100 mg by mouth every evening.    . magnesium gluconate (MAGONATE) 500 MG tablet Take 1,000 mg by mouth daily.     . mirtazapine (REMERON) 15 MG tablet Take 15 mg by mouth at bedtime.    . Multiple Vitamin (MULTIVITAMIN) tablet Take 1 tablet by mouth daily.    . naproxen sodium (ALEVE) 220 MG tablet Take 220 mg by mouth.    . Omega-3-6-9 CAPS Take 1 capsule by mouth 2 (two) times daily.     Marland Kitchen OVER THE COUNTER MEDICATION Take 1 tablet by mouth daily. Life Extension, Brain Support Supplement.    . Potassium 99 MG TABS Take 99 mg by mouth daily.    Marland Kitchen Spacer/Aero-Holding Chambers (AEROCHAMBER MV) inhaler Use as instructed 1 each 0  . vitamin B-12 (CYANOCOBALAMIN) 500 MCG tablet Take 500 mcg by mouth daily.    Marland Kitchen acetaminophen (TYLENOL) 325 MG tablet Take 2 tablets (650 mg total) by mouth every 6 (six) hours as needed for mild pain (or Fever >/=  101). (Patient not taking: Reported on 09/04/2019)    . alum & mag hydroxide-simeth (MAALOX/MYLANTA) 200-200-20 MG/5ML suspension Take 15 mLs by mouth every 6 (six) hours as needed for indigestion or heartburn. (Patient not taking: Reported on 09/04/2019) 355 mL 0   No current facility-administered medications for this visit.    SURGICAL HISTORY:  Past Surgical History:  Procedure Laterality Date  . APPENDECTOMY    . CT guided lung biopsy    . HERNIA REPAIR      REVIEW OF SYSTEMS:  A comprehensive review of systems was negative except for: Respiratory: positive for pleurisy/chest pain   PHYSICAL EXAMINATION: General  appearance: alert, cooperative and no distress Head: Normocephalic, without obvious abnormality, atraumatic Neck: no adenopathy, no JVD, supple, symmetrical, trachea midline and thyroid not enlarged, symmetric, no tenderness/mass/nodules Lymph nodes: Cervical, supraclavicular, and axillary nodes normal. Resp: clear to auscultation bilaterally Back: symmetric, no curvature. ROM normal. No CVA tenderness. Cardio: regular rate and rhythm, S1, S2 normal, no murmur, click, rub or gallop GI: soft, non-tender; bowel sounds normal; no masses,  no organomegaly Extremities: extremities normal, atraumatic, no cyanosis or edema  ECOG PERFORMANCE STATUS: 1 - Symptomatic but completely ambulatory  Blood pressure 130/72, pulse 70, temperature 98.9 F (37.2 C), temperature source Temporal, resp. rate 18, height 4\' 11"  (1.499 m), weight 113 lb 1.6 oz (51.3 kg), SpO2 97 %.  LABORATORY DATA: Lab Results  Component Value Date   WBC 5.5 09/04/2019   HGB 12.7 09/04/2019   HCT 41.1 09/04/2019   MCV 87.4 09/04/2019   PLT 310 09/04/2019      Chemistry      Component Value Date/Time   NA 139 03/27/2019 1426   K 4.1 03/27/2019 1426   CL 106 03/27/2019 1426   CO2 26 03/27/2019 1426   BUN 13 08/16/2019 1244   CREATININE 0.73 08/16/2019 1244      Component Value Date/Time   CALCIUM 9.4 03/27/2019 1426   ALKPHOS 70 03/27/2019 1426   AST 15 03/27/2019 1426   ALT 18 03/27/2019 1426   BILITOT 0.2 (L) 03/27/2019 1426       RADIOGRAPHIC STUDIES: CT Chest W Contrast  Result Date: 08/16/2019 CLINICAL DATA:  Non-small cell lung cancer. Assess treatment response. Receiving radiation therapy. EXAM: CT CHEST WITH CONTRAST TECHNIQUE: Multidetector CT imaging of the chest was performed during intravenous contrast administration. CONTRAST:  50mL OMNIPAQUE IOHEXOL 300 MG/ML  SOLN COMPARISON:  01/31/2019. FINDINGS: Cardiovascular: Atherosclerotic calcification of the aorta, aortic valve and coronary arteries. Heart  is mildly enlarged. No pericardial effusion. Mediastinum/Nodes: Low-attenuation left thyroid nodule measures 2.1 cm, as before. No pathologically enlarged mediastinal, hilar or axillary lymph nodes. Esophagus is grossly unremarkable. Lungs/Pleura: Spiculated left lower lobe nodule appears similar in size, currently measuring 1.6 x 1.6 cm (7/81), compared to 1.4 x 1.5 cm when remeasured in a similar fashion on 01/31/2019. There is largely surrounding ground-glass, consolidation and mild architectural distortion in the lingula and left lower lobe. Mild volume loss in the right middle lobe and right lower lobe. No pleural fluid. Airway is unremarkable. Upper Abdomen: Liver margin is irregular. Low-attenuation lesions in the liver measure up to 2.1 cm in the left hepatic lobe as before and are likely cysts. Visualized portions of the liver and adrenal glands are otherwise unremarkable. Subcentimeter low-attenuation lesions in the kidneys are too small to characterize but statistically, cysts are likely. Spleen and visualized portions of the pancreas, stomach and bowel are grossly unremarkable. No upper abdominal adenopathy. Musculoskeletal: No  worrisome lytic or sclerotic lesions. Degenerative changes in the spine. T3 and T6 compression deformities are unchanged. IMPRESSION: 1. Left lower lobe nodule is similar in size with surrounding evidence of radiation therapy. No evidence of metastatic disease. 2. Cirrhosis. 3. Left thyroid nodule previously evaluated by ultrasound of 02/15/2019. 4. Aortic atherosclerosis (ICD10-I70.0). Coronary artery calcification. Electronically Signed   By: Lorin Picket M.D.   On: 08/16/2019 15:26    ASSESSMENT AND PLAN: This is a very pleasant 86 years old white female with history of stage Ia non-small cell lung cancer, adenocarcinoma diagnosed in October 2020 status post SBRT to the left lower lobe lung nodule under the care of Dr. Tammi Klippel completed in November 2020. The patient is  currently on observation and she is feeling fine. She had repeat CT scan of the chest performed recently.  I personally and independently reviewed the scans and discussed the results with the patient today. Her scan showed no concerning findings for disease progression and there was some mild improvement of the left lower lobe lung nodule. I recommended for her to continue on observation with repeat CT scan of the chest in 6 months. Regarding the muscular pain on the left side of the chest I recommended for the patient to alternate her treatment with Aleve and Tylenol and to discontinue this medication if her pain is better controlled to avoid the toxicity from the NSAIDs. She was advised to call immediately if she has any other concerning symptoms in the interval. The patient voices understanding of current disease status and treatment options and is in agreement with the current care plan.  All questions were answered. The patient knows to call the clinic with any problems, questions or concerns. We can certainly see the patient much sooner if necessary.  Disclaimer: This note was dictated with voice recognition software. Similar sounding words can inadvertently be transcribed and may not be corrected upon review.

## 2019-09-05 NOTE — Addendum Note (Signed)
Encounter addended by: Freeman Caldron, PA-C on: 09/05/2019 5:36 PM  Actions taken: Clinical Note Signed

## 2019-09-06 ENCOUNTER — Telehealth: Payer: Self-pay | Admitting: Internal Medicine

## 2019-09-06 NOTE — Telephone Encounter (Signed)
Scheduled per los. Called and left msg. Mailed printout  °

## 2019-09-22 ENCOUNTER — Other Ambulatory Visit: Payer: Medicare HMO

## 2019-09-25 ENCOUNTER — Ambulatory Visit: Payer: Medicare HMO | Admitting: Internal Medicine

## 2019-10-17 ENCOUNTER — Ambulatory Visit: Payer: Medicare HMO | Admitting: Internal Medicine

## 2019-10-17 ENCOUNTER — Other Ambulatory Visit: Payer: Medicare HMO

## 2019-11-01 ENCOUNTER — Telehealth: Payer: Self-pay | Admitting: Internal Medicine

## 2019-11-01 NOTE — Telephone Encounter (Signed)
Faxed medical records to [facility] at [phone], Release ID: [id number]

## 2020-02-20 ENCOUNTER — Telehealth: Payer: Self-pay | Admitting: Internal Medicine

## 2020-02-20 NOTE — Telephone Encounter (Signed)
Release: [id number] Faxed medical records to [facility] [phone]

## 2020-02-29 ENCOUNTER — Telehealth: Payer: Self-pay | Admitting: Medical Oncology

## 2020-02-29 ENCOUNTER — Other Ambulatory Visit: Payer: Self-pay | Admitting: Medical Oncology

## 2020-02-29 DIAGNOSIS — C3490 Malignant neoplasm of unspecified part of unspecified bronchus or lung: Secondary | ICD-10-CM

## 2020-02-29 NOTE — Telephone Encounter (Addendum)
F/U visit-Sandra Cooper confirmed appt for CT tomorrow . I ordered iSTAT creatinine for tomorrow .  He would like mychart visit on wed Oct 6th because pt will be out of town with dtr [name] .Schedule message sent .

## 2020-02-29 NOTE — Telephone Encounter (Signed)
PT update-PT has been participating in PT for 3 months . Her acute pain is improved. Concerned about nightime pain.   I LVM to Dylan that Julien Nordmann will see her next week.

## 2020-03-01 ENCOUNTER — Inpatient Hospital Stay: Payer: Medicare HMO | Attending: Internal Medicine

## 2020-03-01 ENCOUNTER — Other Ambulatory Visit: Payer: Self-pay

## 2020-03-01 ENCOUNTER — Ambulatory Visit (HOSPITAL_COMMUNITY)
Admission: RE | Admit: 2020-03-01 | Discharge: 2020-03-01 | Disposition: A | Discharge status: Home / Self Care | Payer: Medicare HMO | Source: Ambulatory Visit | Attending: Internal Medicine | Admitting: Internal Medicine

## 2020-03-01 DIAGNOSIS — C3490 Malignant neoplasm of unspecified part of unspecified bronchus or lung: Secondary | ICD-10-CM | POA: Diagnosis not present

## 2020-03-01 DIAGNOSIS — C3432 Malignant neoplasm of lower lobe, left bronchus or lung: Secondary | ICD-10-CM | POA: Diagnosis not present

## 2020-03-01 LAB — CBC WITH DIFFERENTIAL (CANCER CENTER ONLY)
Abs Immature Granulocytes: 0.02 10*3/uL (ref 0.00–0.07)
Basophils Absolute: 0.1 10*3/uL (ref 0.0–0.1)
Basophils Relative: 1 %
Eosinophils Absolute: 0.2 10*3/uL (ref 0.0–0.5)
Eosinophils Relative: 3 %
HCT: 39.6 % (ref 36.0–46.0)
Hemoglobin: 12.7 g/dL (ref 12.0–15.0)
Immature Granulocytes: 0 %
Lymphocytes Relative: 16 %
Lymphs Abs: 1.1 10*3/uL (ref 0.7–4.0)
MCH: 28.7 pg (ref 26.0–34.0)
MCHC: 32.1 g/dL (ref 30.0–36.0)
MCV: 89.6 fL (ref 80.0–100.0)
Monocytes Absolute: 0.7 10*3/uL (ref 0.1–1.0)
Monocytes Relative: 10 %
Neutro Abs: 4.9 10*3/uL (ref 1.7–7.7)
Neutrophils Relative %: 70 %
Platelet Count: 319 10*3/uL (ref 150–400)
RBC: 4.42 MIL/uL (ref 3.87–5.11)
RDW: 14.6 % (ref 11.5–15.5)
WBC Count: 6.9 10*3/uL (ref 4.0–10.5)
nRBC: 0 % (ref 0.0–0.2)

## 2020-03-01 LAB — CMP (CANCER CENTER ONLY)
ALT: 23 U/L (ref 0–44)
AST: 23 U/L (ref 15–41)
Albumin: 3.6 g/dL (ref 3.5–5.0)
Alkaline Phosphatase: 70 U/L (ref 38–126)
Anion gap: 10 (ref 5–15)
BUN: 22 mg/dL (ref 8–23)
CO2: 25 mmol/L (ref 22–32)
Calcium: 9.6 mg/dL (ref 8.9–10.3)
Chloride: 101 mmol/L (ref 98–111)
Creatinine: 0.72 mg/dL (ref 0.44–1.00)
GFR, Est AFR Am: >60 mL/min (ref >60)
GFR, Estimated: >60 mL/min (ref >60)
Glucose, Bld: 145 mg/dL — ABNORMAL HIGH (ref 70–99)
Potassium: 3.9 mmol/L (ref 3.5–5.1)
Sodium: 136 mmol/L (ref 135–145)
Total Bilirubin: 0.9 mg/dL (ref 0.3–1.2)
Total Protein: 6.5 g/dL (ref 6.5–8.1)

## 2020-03-04 ENCOUNTER — Inpatient Hospital Stay: Payer: Medicare HMO

## 2020-03-06 ENCOUNTER — Encounter: Payer: Self-pay | Admitting: Internal Medicine

## 2020-03-06 ENCOUNTER — Inpatient Hospital Stay (HOSPITAL_BASED_OUTPATIENT_CLINIC_OR_DEPARTMENT_OTHER): Payer: Medicare HMO | Admitting: Internal Medicine

## 2020-03-06 DIAGNOSIS — C3432 Malignant neoplasm of lower lobe, left bronchus or lung: Secondary | ICD-10-CM

## 2020-03-06 DIAGNOSIS — I10 Essential (primary) hypertension: Secondary | ICD-10-CM

## 2020-03-06 DIAGNOSIS — C3490 Malignant neoplasm of unspecified part of unspecified bronchus or lung: Secondary | ICD-10-CM | POA: Diagnosis not present

## 2020-03-06 NOTE — Progress Notes (Signed)
[place] [phone]   [phone]  PROGRESS NOTE FOR TELEMEDICINE VISITS  [name], MD [place] [id number]  I connected with@ on 03/06/20 at  9:45 AM EDT by video enabled telemedicine visit and verified that I am speaking with the correct person using two identifiers.   I discussed the limitations, risks, security and privacy concerns of performing an evaluation and management service by telemedicine and the availability of in-person appointments. I also discussed with the patient that there may be a patient responsible charge related to this service. The patient expressed understanding and agreed to proceed.  Other persons participating in the visit and their role in the encounter:  Son and Daughter  Patient's location:  Home Provider's location: [facility].  DIAGNOSIS: Stage IA (T1c, N0, M0) non-small cell lung cancer, adenocarcinoma presented with left lower lobe lung nodule diagnosed in October 2020. The patient is not a good surgical candidate for resection. She has a complicated event after her CT-guided core biopsy with severe respiratory failure.  PRIOR THERAPY: Status post curative SBRT to the left lower lobe lung nodule under the care of Dr. Tammi Klippel completed on April 11, 2019.  CURRENT THERAPY: Observation.  INTERVAL HISTORY: Sandra Cooper 86 y.o. female has a MyChart video visit with me today for evaluation and discussion of her scan results.  The patient is feeling fine today with no concerning complaints except for the dryness in her throat.  She was given prescription for Allegra by her primary care physician and her dryness has been worse.  She denied having any current chest pain but has mild cough with no shortness of breath or hemoptysis.  She denied having any fever or chills.  She has no nausea, vomiting, diarrhea or constipation.  She has no headache or visual changes.  She had CT  scan of the chest performed recently and we are having the visit for evaluation and discussion of the scan results.  MEDICAL HISTORY: Past Medical History:  Diagnosis Date  . Arthritis   . Asthma   . Hypertension   . Lung cancer ([place])    adenocarcinoma [facility]    ALLERGIES:  is allergic to fluoxetine.  MEDICATIONS:  Current Outpatient Medications  Medication Sig Dispense Refill  . acetaminophen (TYLENOL) 325 MG tablet Take 2 tablets (650 mg total) by mouth every 6 (six) hours as needed for mild pain (or Fever >/= 101). (Patient not taking: Reported on 09/04/2019)    . albuterol (VENTOLIN HFA) 108 (90 Base) MCG/ACT inhaler Inhale 2 puffs into the lungs every 6 (six) hours as needed for wheezing or shortness of breath. 6.7 g 2  . alum & mag hydroxide-simeth (MAALOX/MYLANTA) 200-200-20 MG/5ML suspension Take 15 mLs by mouth every 6 (six) hours as needed for indigestion or heartburn. (Patient not taking: Reported on 09/04/2019) 355 mL 0  . amLODipine (NORVASC) 5 MG tablet Take 1 tablet (5 mg total) by mouth daily. 30 tablet 0  . budesonide-formoterol (SYMBICORT) 160-4.5 MCG/ACT inhaler Inhale 2 puffs into the lungs 2 (two) times daily. 1 Inhaler 6  . Calcium Carb-Cholecalciferol (CALCIUM-VITAMIN D) 600-400 MG-UNIT TABS Take 1 tablet by mouth daily.    . Camphor-Menthol-Methyl Sal (SALONPAS) 3.06-06-08 % PTCH Place 1 patch onto the skin daily as needed (pain).    . Cholecalciferol (VITAMIN D) 50 MCG (2000 UT) CAPS Take 2,000 Units by mouth daily.    Marland Kitchen DHA-Vitamin C-Lutein (EYE HEALTH FORMULA PO) Take 1 tablet by  mouth daily.    Marland Kitchen losartan (COZAAR) 100 MG tablet Take 100 mg by mouth every evening.    . magnesium gluconate (MAGONATE) 500 MG tablet Take 1,000 mg by mouth daily.     . mirtazapine (REMERON) 15 MG tablet Take 15 mg by mouth at bedtime.    . Multiple Vitamin (MULTIVITAMIN) tablet Take 1 tablet by mouth daily.    . naproxen sodium (ALEVE) 220 MG tablet Take 220 mg by mouth.    . Omega-3-6-9  CAPS Take 1 capsule by mouth 2 (two) times daily.     Marland Kitchen OVER THE COUNTER MEDICATION Take 1 tablet by mouth daily. Life Extension, Brain Support Supplement.    . Potassium 99 MG TABS Take 99 mg by mouth daily.    Marland Kitchen Spacer/Aero-Holding Chambers (AEROCHAMBER MV) inhaler Use as instructed 1 each 0  . vitamin B-12 (CYANOCOBALAMIN) 500 MCG tablet Take 500 mcg by mouth daily.     No current facility-administered medications for this visit.    SURGICAL HISTORY:  Past Surgical History:  Procedure Laterality Date  . APPENDECTOMY    . CT guided lung biopsy    . HERNIA REPAIR      REVIEW OF SYSTEMS:  A comprehensive review of systems was negative except for: Ears, nose, mouth, throat, and face: positive for Dryness of the mouth.    LABORATORY DATA: Lab Results  Component Value Date   WBC 6.9 03/01/2020   HGB 12.7 03/01/2020   HCT 39.6 03/01/2020   MCV 89.6 03/01/2020   PLT 319 03/01/2020      Chemistry      Component Value Date/Time   NA 136 03/01/2020 1552   K 3.9 03/01/2020 1552   CL 101 03/01/2020 1552   CO2 25 03/01/2020 1552   BUN 22 03/01/2020 1552   CREATININE 0.72 03/01/2020 1552      Component Value Date/Time   CALCIUM 9.6 03/01/2020 1552   ALKPHOS 70 03/01/2020 1552   AST 23 03/01/2020 1552   ALT 23 03/01/2020 1552   BILITOT 0.9 03/01/2020 1552       RADIOGRAPHIC STUDIES: CT Chest Wo Contrast  Result Date: 03/01/2020 CLINICAL DATA:  Patient status post radiation therapy left lower lobe nodule. Follow-up exam. EXAM: CT CHEST WITHOUT CONTRAST TECHNIQUE: Multidetector CT imaging of the chest was performed following the standard protocol without IV contrast. COMPARISON:  Chest CT 08/16/2019 FINDINGS: Cardiovascular: Ascending thoracic aorta measures 4.1 cm. Peripheral calcified atherosclerotic plaque. No pericardial effusion. Normal heart size. Mediastinum/Nodes: No enlarged axillary, mediastinal hilar lymphadenopathy. Normal appearance of the esophagus. Unchanged  nodule left thyroid lobe. Lungs/Pleura: Central airways are patent. Interval increase in size of sharply marginated nodular masslike area within the left lower lung measuring 3.8 x 2.4 cm. Left lower lobe nodule previously measured 1.6 x 1.6 cm. Surrounding bandlike ground-glass and consolidative opacities within the left lower lobe and lingula. Subpleural scarring within the peripheral right middle and right lower lobes. No pleural effusion or pneumothorax. Upper Abdomen: Nodular contour of the liver. Similar-appearing 2.1 cm cyst left hepatic lobe. Musculoskeletal: Thoracic spine degenerative changes. No aggressive or acute appearing osseous lesions. Unchanged compression deformities of the T3 and T6 levels. IMPRESSION: 1. Interval increase in size of sharply marginated nodular masslike area within the left lower lung with surrounding bandlike ground-glass and consolidative opacities. Findings may be secondary to continued evolution of post radiation changes about the known left lower lobe malignancy. Recommend continued attention on follow-up to assess for interval increase in size. 2.  Ascending thoracic aorta measures 4.1 cm. Recommend attention on follow-up. 3. Aortic atherosclerosis. Electronically Signed   By: Lovey Newcomer M.D.   On: 03/01/2020 16:50    ASSESSMENT AND PLAN: This is a very pleasant 86 years old white female with history of stage Ia non-small cell lung cancer, adenocarcinoma diagnosed in October 2020 status post SBRT to the left lower lobe lung nodule under the care of Dr. Tammi Klippel completed in November 2020. The patient is currently on observation and she is feeling fine with no concerning complaints. She had repeat CT scan of the chest performed recently.  I personally and independently reviewed the scan images and discussed the result and showed the images to the patient and her family during the video visit.  Her scan showed increase in the nodular masslike area in the left lower lobe  was consolidative changes suspicious for evaluation of post radiation changes. I recommended for the patient to continue on observation with repeat CT scan of the chest in 6 months. She was advised to call if she has any concerning symptoms in the interval. I discussed the assessment and treatment plan with the patient. The patient was provided an opportunity to ask questions and all were answered. The patient agreed with the plan and demonstrated an understanding of the instructions.   The patient was advised to call back or seek an in-person evaluation if the symptoms worsen or if the condition fails to improve as anticipated.  I provided 15 minutes of face-to-face video visit time during this encounter, and > 50% was spent counseling as documented under my assessment & plan.  Eilleen Kempf, MD 03/06/2020 10:01 AM  Disclaimer: This note was dictated with voice recognition software. Similar sounding words can inadvertently be transcribed and may not be corrected upon review.

## 2020-04-01 ENCOUNTER — Telehealth: Payer: Self-pay | Admitting: Medical Oncology

## 2020-04-01 NOTE — Telephone Encounter (Signed)
Reclast approval -Dr Janeann Forehand wants to know if she can move forward with giving [name] reclast?

## 2020-04-01 NOTE — Telephone Encounter (Signed)
Ok with me 

## 2020-04-02 NOTE — Telephone Encounter (Signed)
Per Dr Julien Nordmann pt can get reclast -[facility] notified via VM

## 2020-05-08 ENCOUNTER — Telehealth: Payer: Self-pay | Admitting: Internal Medicine

## 2020-05-08 NOTE — Telephone Encounter (Signed)
[id number] Faxed medical records to [facility] @ fax#[phone]

## 2020-07-09 ENCOUNTER — Other Ambulatory Visit: Payer: Self-pay | Admitting: Pulmonary Disease

## 2020-07-09 DIAGNOSIS — J9611 Chronic respiratory failure with hypoxia: Secondary | ICD-10-CM

## 2020-07-09 DIAGNOSIS — J4540 Moderate persistent asthma, uncomplicated: Secondary | ICD-10-CM

## 2020-07-11 ENCOUNTER — Other Ambulatory Visit: Payer: Self-pay | Admitting: Pulmonary Disease

## 2020-07-11 DIAGNOSIS — J9611 Chronic respiratory failure with hypoxia: Secondary | ICD-10-CM

## 2020-07-11 DIAGNOSIS — J4540 Moderate persistent asthma, uncomplicated: Secondary | ICD-10-CM

## 2020-08-26 ENCOUNTER — Emergency Department (HOSPITAL_COMMUNITY): Payer: Medicare HMO

## 2020-08-26 ENCOUNTER — Ambulatory Visit (HOSPITAL_COMMUNITY)
Admission: EM | Admit: 2020-08-26 | Discharge: 2020-08-26 | Disposition: A | Discharge status: Home / Self Care | Payer: Medicare HMO | Attending: General Surgery | Admitting: General Surgery

## 2020-08-26 ENCOUNTER — Emergency Department (HOSPITAL_COMMUNITY): Payer: Medicare HMO | Admitting: Anesthesiology

## 2020-08-26 ENCOUNTER — Encounter (HOSPITAL_COMMUNITY)
Admission: EM | Disposition: A | Discharge status: Home / Self Care | Payer: Self-pay | Source: Home / Self Care | Attending: Emergency Medicine

## 2020-08-26 ENCOUNTER — Other Ambulatory Visit: Payer: Self-pay

## 2020-08-26 ENCOUNTER — Encounter (HOSPITAL_COMMUNITY): Payer: Self-pay | Admitting: Emergency Medicine

## 2020-08-26 DIAGNOSIS — Z888 Allergy status to other drugs, medicaments and biological substances status: Secondary | ICD-10-CM | POA: Diagnosis not present

## 2020-08-26 DIAGNOSIS — Z23 Encounter for immunization: Secondary | ICD-10-CM | POA: Diagnosis not present

## 2020-08-26 DIAGNOSIS — Z85118 Personal history of other malignant neoplasm of bronchus and lung: Secondary | ICD-10-CM | POA: Diagnosis not present

## 2020-08-26 DIAGNOSIS — S52502B Unspecified fracture of the lower end of left radius, initial encounter for open fracture type I or II: Secondary | ICD-10-CM | POA: Diagnosis not present

## 2020-08-26 DIAGNOSIS — Z79899 Other long term (current) drug therapy: Secondary | ICD-10-CM | POA: Diagnosis not present

## 2020-08-26 DIAGNOSIS — I10 Essential (primary) hypertension: Secondary | ICD-10-CM | POA: Diagnosis not present

## 2020-08-26 DIAGNOSIS — Y9301 Activity, walking, marching and hiking: Secondary | ICD-10-CM | POA: Diagnosis not present

## 2020-08-26 DIAGNOSIS — M25561 Pain in right knee: Secondary | ICD-10-CM

## 2020-08-26 DIAGNOSIS — W010XXA Fall on same level from slipping, tripping and stumbling without subsequent striking against object, initial encounter: Secondary | ICD-10-CM | POA: Insufficient documentation

## 2020-08-26 DIAGNOSIS — M25461 Effusion, right knee: Secondary | ICD-10-CM | POA: Diagnosis present

## 2020-08-26 DIAGNOSIS — S52612B Displaced fracture of left ulna styloid process, initial encounter for open fracture type I or II: Secondary | ICD-10-CM | POA: Diagnosis not present

## 2020-08-26 DIAGNOSIS — Z20822 Contact with and (suspected) exposure to covid-19: Secondary | ICD-10-CM | POA: Diagnosis not present

## 2020-08-26 HISTORY — PX: ORIF WRIST FRACTURE: SHX2133

## 2020-08-26 HISTORY — PX: I & D EXTREMITY: SHX5045

## 2020-08-26 LAB — BASIC METABOLIC PANEL
Anion gap: 7 (ref 5–15)
BUN: 26 mg/dL — ABNORMAL HIGH (ref 8–23)
CO2: 23 mmol/L (ref 22–32)
Calcium: 8.8 mg/dL — ABNORMAL LOW (ref 8.9–10.3)
Chloride: 104 mmol/L (ref 98–111)
Creatinine, Ser: 0.82 mg/dL (ref 0.44–1.00)
GFR, Estimated: >60 mL/min (ref >60)
Glucose, Bld: 130 mg/dL — ABNORMAL HIGH (ref 70–99)
Potassium: 4.6 mmol/L (ref 3.5–5.1)
Sodium: 134 mmol/L — ABNORMAL LOW (ref 135–145)

## 2020-08-26 LAB — CBC
HCT: 38.1 % (ref 36.0–46.0)
Hemoglobin: 12.1 g/dL (ref 12.0–15.0)
MCH: 28.8 pg (ref 26.0–34.0)
MCHC: 31.8 g/dL (ref 30.0–36.0)
MCV: 90.7 fL (ref 80.0–100.0)
Platelets: 306 10*3/uL (ref 150–400)
RBC: 4.2 MIL/uL (ref 3.87–5.11)
RDW: 14.3 % (ref 11.5–15.5)
WBC: 10.8 10*3/uL — ABNORMAL HIGH (ref 4.0–10.5)
nRBC: 0 % (ref 0.0–0.2)

## 2020-08-26 LAB — RESP PANEL BY RT-PCR (FLU A&B, COVID) ARPGX2
Influenza A by PCR: NEGATIVE
Influenza B by PCR: NEGATIVE
SARS Coronavirus 2 by RT PCR: NEGATIVE

## 2020-08-26 SURGERY — IRRIGATION AND DEBRIDEMENT EXTREMITY
Anesthesia: General | Site: Wrist | Laterality: Left

## 2020-08-26 MED ORDER — BUPIVACAINE-MELOXICAM ER 200-6 MG/7ML IJ SOLN
INTRAMUSCULAR | Status: DC | PRN
  Administered 2020-08-26: 6 mL

## 2020-08-26 MED ORDER — ALBUTEROL SULFATE HFA 108 (90 BASE) MCG/ACT IN AERS
INHALATION_SPRAY | RESPIRATORY_TRACT | Status: DC | PRN
  Administered 2020-08-26: 2 via RESPIRATORY_TRACT

## 2020-08-26 MED ORDER — PHENYLEPHRINE 40 MCG/ML (10ML) SYRINGE FOR IV PUSH (FOR BLOOD PRESSURE SUPPORT)
PREFILLED_SYRINGE | INTRAVENOUS | Status: AC
  Filled 2020-08-26: qty 10

## 2020-08-26 MED ORDER — BUPIVACAINE-MELOXICAM ER 200-6 MG/7ML IJ SOLN
200.0000 mg | Freq: Once | INTRAMUSCULAR | Status: DC
  Filled 2020-08-26: qty 1

## 2020-08-26 MED ORDER — ONDANSETRON HCL 4 MG/2ML IJ SOLN: 4.0000 mg | Freq: Once | INTRAMUSCULAR | Status: DC | PRN

## 2020-08-26 MED ORDER — BUPIVACAINE HCL (PF) 0.5 % IJ SOLN
INTRAMUSCULAR | Status: AC
  Filled 2020-08-26: qty 30

## 2020-08-26 MED ORDER — CHLORHEXIDINE GLUCONATE 4 % EX LIQD: 60.0000 mL | Freq: Once | CUTANEOUS | Status: DC

## 2020-08-26 MED ORDER — ONDANSETRON HCL 4 MG/2ML IJ SOLN
INTRAMUSCULAR | Status: AC
  Filled 2020-08-26: qty 2

## 2020-08-26 MED ORDER — ALBUTEROL SULFATE HFA 108 (90 BASE) MCG/ACT IN AERS
INHALATION_SPRAY | RESPIRATORY_TRACT | Status: AC
  Filled 2020-08-26: qty 6.7

## 2020-08-26 MED ORDER — POVIDONE-IODINE 10 % EX SWAB
2.0000 "application " | Freq: Once | CUTANEOUS | Status: AC
  Administered 2020-08-26: 2 via TOPICAL

## 2020-08-26 MED ORDER — ROCURONIUM BROMIDE 100 MG/10ML IV SOLN
INTRAVENOUS | Status: DC | PRN
  Administered 2020-08-26: 50 mg via INTRAVENOUS

## 2020-08-26 MED ORDER — CEPHALEXIN 500 MG PO CAPS: 500.0000 mg | ORAL_CAPSULE | Freq: Four times a day (QID) | ORAL | 0 refills | Status: AC

## 2020-08-26 MED ORDER — SUGAMMADEX SODIUM 200 MG/2ML IV SOLN
INTRAVENOUS | Status: DC | PRN
Start: 2020-08-26 — End: 2020-08-26
  Administered 2020-08-26: 200 mg via INTRAVENOUS

## 2020-08-26 MED ORDER — FENTANYL CITRATE (PF) 100 MCG/2ML IJ SOLN
75.0000 ug | Freq: Once | INTRAMUSCULAR | Status: AC
Start: 2020-08-26 — End: 2020-08-26
  Administered 2020-08-26: 75 ug via INTRAVENOUS
  Filled 2020-08-26: qty 2

## 2020-08-26 MED ORDER — FENTANYL CITRATE (PF) 250 MCG/5ML IJ SOLN
INTRAMUSCULAR | Status: AC
  Filled 2020-08-26: qty 5

## 2020-08-26 MED ORDER — CEFAZOLIN SODIUM-DEXTROSE 2-4 GM/100ML-% IV SOLN
2.0000 g | Freq: Once | INTRAVENOUS | Status: AC
  Administered 2020-08-26: 2 g via INTRAVENOUS
  Filled 2020-08-26: qty 100

## 2020-08-26 MED ORDER — LIDOCAINE 2% (20 MG/ML) 5 ML SYRINGE
INTRAMUSCULAR | Status: AC
  Filled 2020-08-26: qty 5

## 2020-08-26 MED ORDER — DEXAMETHASONE SODIUM PHOSPHATE 10 MG/ML IJ SOLN
INTRAMUSCULAR | Status: AC
  Filled 2020-08-26: qty 1

## 2020-08-26 MED ORDER — PROPOFOL 10 MG/ML IV BOLUS
INTRAVENOUS | Status: DC | PRN
  Administered 2020-08-26: 100 mg via INTRAVENOUS
  Administered 2020-08-26: 30 mg via INTRAVENOUS

## 2020-08-26 MED ORDER — DEXAMETHASONE SODIUM PHOSPHATE 10 MG/ML IJ SOLN
INTRAMUSCULAR | Status: DC | PRN
  Administered 2020-08-26: 10 mg via INTRAVENOUS

## 2020-08-26 MED ORDER — PROPOFOL 10 MG/ML IV BOLUS
INTRAVENOUS | Status: AC
  Filled 2020-08-26: qty 20

## 2020-08-26 MED ORDER — TETANUS-DIPHTH-ACELL PERTUSSIS 5-2.5-18.5 LF-MCG/0.5 IM SUSY
0.5000 mL | PREFILLED_SYRINGE | Freq: Once | INTRAMUSCULAR | Status: AC
  Administered 2020-08-26: 0.5 mL via INTRAMUSCULAR
  Filled 2020-08-26: qty 0.5

## 2020-08-26 MED ORDER — LACTATED RINGERS IV SOLN: INTRAVENOUS | Status: DC

## 2020-08-26 MED ORDER — FENTANYL CITRATE (PF) 100 MCG/2ML IJ SOLN
INTRAMUSCULAR | Status: DC | PRN
  Administered 2020-08-26: 100 ug via INTRAVENOUS
  Administered 2020-08-26: 50 ug via INTRAVENOUS

## 2020-08-26 MED ORDER — FENTANYL CITRATE (PF) 100 MCG/2ML IJ SOLN: 25.0000 ug | INTRAMUSCULAR | Status: DC | PRN

## 2020-08-26 MED ORDER — FENTANYL CITRATE (PF) 100 MCG/2ML IJ SOLN
50.0000 ug | Freq: Once | INTRAMUSCULAR | Status: AC
  Administered 2020-08-26: 50 ug via INTRAVENOUS
  Filled 2020-08-26: qty 2

## 2020-08-26 MED ORDER — LACTATED RINGERS IV SOLN: INTRAVENOUS | Status: DC | PRN

## 2020-08-26 MED ORDER — HYDROCODONE-ACETAMINOPHEN 5-325 MG PO TABS
1.0000 | ORAL_TABLET | Freq: Four times a day (QID) | ORAL | 0 refills | Status: AC | PRN
Start: 2020-08-26 — End: 2020-09-20

## 2020-08-26 MED ORDER — SODIUM CHLORIDE 0.9 % IR SOLN
Status: DC | PRN
  Administered 2020-08-26: 1000 mL

## 2020-08-26 MED ORDER — CEFAZOLIN SODIUM-DEXTROSE 2-4 GM/100ML-% IV SOLN: 2.0000 g | INTRAVENOUS | Status: DC

## 2020-08-26 MED ORDER — ONDANSETRON HCL 4 MG/2ML IJ SOLN
INTRAMUSCULAR | Status: DC | PRN
Start: 2020-08-26 — End: 2020-08-26
  Administered 2020-08-26: 4 mg via INTRAVENOUS

## 2020-08-26 SURGICAL SUPPLY — 50 items
BAG ZIPLOCK 12X15 (MISCELLANEOUS) ×2 IMPLANT
BIT DRILL 2.0 LNG QUCK RELEASE (BIT) ×1 IMPLANT
BIT DRILL QC 2.8X5 (BIT) ×2 IMPLANT
BNDG ELASTIC 4X5.8 VLCR STR LF (GAUZE/BANDAGES/DRESSINGS) ×2 IMPLANT
BNDG GAUZE ELAST 4 BULKY (GAUZE/BANDAGES/DRESSINGS) ×2 IMPLANT
CORD BIPOLAR FORCEPS 12FT (ELECTRODE) ×2 IMPLANT
COVER SURGICAL LIGHT HANDLE (MISCELLANEOUS) ×2 IMPLANT
COVER WAND RF STERILE (DRAPES) IMPLANT
CUFF TOURN SGL QUICK 18X4 (TOURNIQUET CUFF) ×2 IMPLANT
CUFF TOURN SGL QUICK 24 (TOURNIQUET CUFF)
CUFF TRNQT CYL 24X4X16.5-23 (TOURNIQUET CUFF) IMPLANT
DRAIN PENROSE 0.5X18 (DRAIN) IMPLANT
DRAPE SURG 17X11 SM STRL (DRAPES) ×4 IMPLANT
DRILL 2.0 LNG QUICK RELEASE (BIT) ×2
ELECT REM PT RETURN 15FT ADLT (MISCELLANEOUS) ×2 IMPLANT
GAUZE SPONGE 4X4 12PLY STRL (GAUZE/BANDAGES/DRESSINGS) ×2 IMPLANT
GAUZE XEROFORM 1X8 LF (GAUZE/BANDAGES/DRESSINGS) ×2 IMPLANT
GLOVE BIOGEL M 8.0 STRL (GLOVE) ×4 IMPLANT
GLOVE SURG UNDER POLY LF SZ6.5 (GLOVE) ×2 IMPLANT
GLOVE SURG UNDER POLY LF SZ7 (GLOVE) ×2 IMPLANT
GLOVE SURG UNDER POLY LF SZ7.5 (GLOVE) ×2 IMPLANT
GUIDEWIRE ORTHO 0.054X6 (WIRE) ×2 IMPLANT
HANDPIECE INTERPULSE COAX TIP (DISPOSABLE) ×1
KIT BASIN OR (CUSTOM PROCEDURE TRAY) ×2 IMPLANT
KIT TURNOVER KIT A (KITS) ×2 IMPLANT
NEEDLE HYPO 22GX1.5 SAFETY (NEEDLE) ×2 IMPLANT
NS IRRIG 1000ML POUR BTL (IV SOLUTION) ×2 IMPLANT
PACK ORTHO EXTREMITY (CUSTOM PROCEDURE TRAY) ×2 IMPLANT
PADDING CAST SYNTHETIC 4 (CAST SUPPLIES) ×1
PADDING CAST SYNTHETIC 4X4 STR (CAST SUPPLIES) ×1 IMPLANT
PENCIL SMOKE EVACUATOR (MISCELLANEOUS) IMPLANT
PLATE PROX NARROW LEFT (Plate) ×2 IMPLANT
PROTECTOR NERVE ULNAR (MISCELLANEOUS) ×2 IMPLANT
SCOTCHCAST PLUS 4X4 WHITE (CAST SUPPLIES) ×2 IMPLANT
SCREW BN FT 16X2.3XLCK HEX CRT (Screw) ×2 IMPLANT
SCREW CORTICAL 2.3X10MM (Screw) ×2 IMPLANT
SCREW CORTICAL LOCKING 2.3X14M (Screw) ×4 IMPLANT
SCREW CORTICAL LOCKING 2.3X16M (Screw) ×2 IMPLANT
SCREW HEXALOBE NON-LOCK 3.5X14 (Screw) ×2 IMPLANT
SCREW NONLOCK HEX 3.5X12 (Screw) ×6 IMPLANT
SET HNDPC FAN SPRY TIP SCT (DISPOSABLE) ×1 IMPLANT
STOCKINETTE 6  STRL (DRAPES) ×1
STOCKINETTE 6 STRL (DRAPES) ×1 IMPLANT
SUT ETHILON 3 0 PS 1 (SUTURE) ×4 IMPLANT
SUT ETHILON 4 0 PS 2 18 (SUTURE) ×2 IMPLANT
SUT VIC AB 3-0 SH 27 (SUTURE) ×1
SUT VIC AB 3-0 SH 27X BRD (SUTURE) ×1 IMPLANT
SWAB COLLECTION DEVICE MRSA (MISCELLANEOUS) IMPLANT
SWAB CULTURE ESWAB REG 1ML (MISCELLANEOUS) IMPLANT
SYR CONTROL 10ML LL (SYRINGE) ×2 IMPLANT

## 2020-08-26 NOTE — Discharge Instructions (Signed)
General Anesthesia, Adult, Care After This sheet gives you information about how to care for yourself after your procedure. Your health care provider may also give you more specific instructions. If you have problems or questions, contact your health care provider. What can I expect after the procedure? After the procedure, the following side effects are common:  Pain or discomfort at the IV site.  Nausea.  Vomiting.  Sore throat.  Trouble concentrating.  Feeling cold or chills.  Feeling weak or tired.  Sleepiness and fatigue.  Soreness and body aches. These side effects can affect parts of the body that were not involved in surgery. Follow these instructions at home: For the time period you were told by your health care provider:  Rest.  Do not participate in activities where you could fall or become injured.  Do not drive or use machinery.  Do not drink alcohol.  Do not take sleeping pills or medicines that cause drowsiness.  Do not make important decisions or sign legal documents.  Do not take care of children on your own.   Eating and drinking  Follow any instructions from your health care provider about eating or drinking restrictions.  When you feel hungry, start by eating small amounts of foods that are soft and easy to digest (bland), such as toast. Gradually return to your regular diet.  Drink enough fluid to keep your urine pale yellow.  If you vomit, rehydrate by drinking water, juice, or clear broth. General instructions  If you have sleep apnea, surgery and certain medicines can increase your risk for breathing problems. Follow instructions from your health care provider about wearing your sleep device: ? Anytime you are sleeping, including during daytime naps. ? While taking prescription pain medicines, sleeping medicines, or medicines that make you drowsy.  Have a responsible adult stay with you for the time you are told. It is important to have  someone help care for you until you are awake and alert.  Return to your normal activities as told by your health care provider. Ask your health care provider what activities are safe for you.  Take over-the-counter and prescription medicines only as told by your health care provider.  If you smoke, do not smoke without supervision.  Keep all follow-up visits as told by your health care provider. This is important. Contact a health care provider if:  You have nausea or vomiting that does not get better with medicine.  You cannot eat or drink without vomiting.  You have pain that does not get better with medicine.  You are unable to pass urine.  You develop a skin rash.  You have a fever.  You have redness around your IV site that gets worse. Get help right away if:  You have difficulty breathing.  You have chest pain.  You have blood in your urine or stool, or you vomit blood. Summary  After the procedure, it is common to have a sore throat or nausea. It is also common to feel tired.  Have a responsible adult stay with you for the time you are told. It is important to have someone help care for you until you are awake and alert.  When you feel hungry, start by eating small amounts of foods that are soft and easy to digest (bland), such as toast. Gradually return to your regular diet.  Drink enough fluid to keep your urine pale yellow.  Return to your normal activities as told by your health care provider.  Ask your health care provider what activities are safe for you. This information is not intended to replace advice given to you by your health care provider. Make sure you discuss any questions you have with your health care provider. Document Revised: 02/01/2020 Document Reviewed: 08/31/2019 Elsevier Patient Education  2021 [place].   Discharge Instructions:  Keep your dressing clean, dry and in place until instructed to remove by Dr. Lenon Curt.  If the dressing  becomes dirty or wet call the office for instructions during business hours. Elevate the extremity to help with swelling, this will also help with any discomfort. Take your medication as prescribed. No lifting with the injured  extremity. If you feel that the dressing is too tight, you may loosen it, but keep it on; finger tips should be pink; if there is a concern, call the office. [phone] Ice may be used if the injury is a fracture, do not apply ice directly to the skin. Please call the office on the next business day after discharge to arrange a follow up appointment.  Call [phone] between the hours of 9am - 5pm M-Th or 9am - 1pm on Fri. For most hand injuries and/or conditions, you may return to work using the uninjured hand (one handed duty) within 24-72 hours.  A detailed note will be provided to you at your follow up appointment or may contact the office prior to your follow up.

## 2020-08-26 NOTE — Anesthesia Postprocedure Evaluation (Signed)
Anesthesia Post Note  Patient: Sandra Cooper  Procedure(s) Performed: IRRIGATION AND DEBRIDEMENT EXTREMITY (Left Wrist) OPEN REDUCTION INTERNAL FIXATION (ORIF) WRIST FRACTURE (Left Wrist)     Patient location during evaluation: PACU Anesthesia Type: General Level of consciousness: awake and alert and oriented Pain management: pain level controlled Vital Signs Assessment: post-procedure vital signs reviewed and stable Respiratory status: spontaneous breathing, nonlabored ventilation and respiratory function stable Cardiovascular status: blood pressure returned to baseline and stable Postop Assessment: no apparent nausea or vomiting Anesthetic complications: no   No complications documented.  Last Vitals:  Vitals:   08/26/20 1915 08/26/20 1945  BP:    Pulse:    Resp:  13  Temp: 36.9 C   SpO2:  94%    Last Pain:  Vitals:   08/26/20 1945  TempSrc:   PainSc: Asleep                 Zymier Rodgers A.

## 2020-08-26 NOTE — Progress Notes (Signed)
Pt seen and examined.  Procedure explained to patient including surgery and early post op course.Consent signed.

## 2020-08-26 NOTE — ED Notes (Signed)
Son in central waiting- going to wait for pt to be done with surgery, please call or see son when surgery complete, number in chart

## 2020-08-26 NOTE — ED Provider Notes (Signed)
[facility] DEPT Provider Note   CSN: [id number] Arrival date & time: 08/26/20  1037     History Chief Complaint  Patient presents with  . Wrist Pain  . Knee Pain  . Wrist Injury    Sandra Cooper is a 87 y.o. female.  87 year old female with past medical history below including hypertension and lung cancer who presents with fall.  Just prior to arrival, patient had a mechanical fall forward during which she struck her left wrist and bumped her nose and right knee.  She reports mild pain in her right knee and nose, no epistaxis.  She has severe, constant pain in her left wrist with obvious deformity.  No medications prior to arrival.  She reports normal sensation and movement of her fingers.  She is right-handed. No anticoagulant use. She denies head injury, only bumped nose and did not strike head or lose consciousness.  The history is provided by the patient.  Wrist Pain  Knee Pain Wrist Injury      Past Medical History:  Diagnosis Date  . Arthritis   . Asthma   . Hypertension   . Lung cancer ([place])    adenocarcinoma LLL    Patient Active Problem List   Diagnosis Date Noted  . Goals of care, counseling/discussion 03/27/2019  . Primary malignant neoplasm of bronchus of left lower lobe ([place]) 03/16/2019  . Dyspnea 03/10/2019  . Hemoptysis 03/09/2019  . Lung mass 02/03/2019  . Post-menopausal osteoporosis 11/24/2018  . Hypertension, essential 02/10/2016    Past Surgical History:  Procedure Laterality Date  . APPENDECTOMY    . CT guided lung biopsy    . HERNIA REPAIR       OB History   No obstetric history on file.     Family History  Problem Relation Age of Onset  . Cancer Neg Hx     Social History   Tobacco Use  . Smoking status: Never Smoker  . Smokeless tobacco: Never Used  . Tobacco comment: Exposed to second hand smoke  Vaping Use  . Vaping Use: Never used  Substance Use Topics  . Alcohol use: Never  . Drug use:  Never    Home Medications Prior to Admission medications   Medication Sig Start Date End Date Taking? Authorizing Provider  acetaminophen (TYLENOL) 325 MG tablet Take 2 tablets (650 mg total) by mouth every 6 (six) hours as needed for mild pain (or Fever >/= 101). Patient not taking: Reported on 09/04/2019 03/15/19   Kinnie Feil, MD  albuterol (VENTOLIN HFA) 108 (90 Base) MCG/ACT inhaler Inhale 2 puffs into the lungs every 6 (six) hours as needed for wheezing or shortness of breath. 03/15/19   Kinnie Feil, MD  alum & mag hydroxide-simeth (MAALOX/MYLANTA) 200-200-20 MG/5ML suspension Take 15 mLs by mouth every 6 (six) hours as needed for indigestion or heartburn. Patient not taking: Reported on 09/04/2019 03/15/19   Kinnie Feil, MD  amLODipine (NORVASC) 5 MG tablet Take 1 tablet (5 mg total) by mouth daily. 03/15/19   Kinnie Feil, MD  budesonide-formoterol (SYMBICORT) 160-4.5 MCG/ACT inhaler Inhale 2 puffs into the lungs 2 (two) times daily. 04/13/19   Chesley Mires, MD  Calcium Carb-Cholecalciferol (CALCIUM-VITAMIN D) 600-400 MG-UNIT TABS Take 1 tablet by mouth daily.    [provider]  Camphor-Menthol-Methyl Sal (SALONPAS) 3.06-06-08 % PTCH Place 1 patch onto the skin daily as needed (pain).    [provider]  Cholecalciferol (VITAMIN D) 50 MCG (2000  UT) CAPS Take 2,000 Units by mouth daily.    [provider]  DHA-Vitamin C-Lutein (EYE HEALTH FORMULA PO) Take 1 tablet by mouth daily.    [provider]  losartan (COZAAR) 100 MG tablet Take 100 mg by mouth every evening. 01/21/19   [provider]  magnesium gluconate (MAGONATE) 500 MG tablet Take 1,000 mg by mouth daily.     [provider]  mirtazapine (REMERON) 15 MG tablet Take 15 mg by mouth at bedtime. 11/29/18   [provider]  Multiple Vitamin (MULTIVITAMIN) tablet Take 1 tablet by mouth daily.    [provider]  naproxen sodium (ALEVE) 220 MG  tablet Take 220 mg by mouth.    [provider]  Omega-3-6-9 CAPS Take 1 capsule by mouth 2 (two) times daily.     [provider]  OVER THE COUNTER MEDICATION Take 1 tablet by mouth daily. Life Extension, Brain Support Supplement.    [provider]  Potassium 99 MG TABS Take 99 mg by mouth daily.    [provider]  Spacer/Aero-Holding Chambers (AEROCHAMBER MV) inhaler Use as instructed 04/05/19   Chesley Mires, MD  vitamin B-12 (CYANOCOBALAMIN) 500 MCG tablet Take 500 mcg by mouth daily.    [provider]    Allergies    Fluoxetine  Review of Systems   Review of Systems All other systems reviewed and are negative except that which was mentioned in HPI  Physical Exam Updated Vital Signs BP 126/73   Pulse 76   Temp (!) 97.4 F (36.3 C) (Oral)   Resp 18   Ht 5' (1.524 m)   Wt 52.2 kg   SpO2 94%   BMI 22.46 kg/m   Physical Exam Vitals and nursing note reviewed.  Constitutional:      General: She is not in acute distress.    Appearance: She is well-developed.  HENT:     Head: Normocephalic and atraumatic.     Nose:     Comments: Small bruise R side of nose near glasses, no epistaxis Eyes:     Conjunctiva/sclera: Conjunctivae normal.     Pupils: Pupils are equal, round, and reactive to light.  Cardiovascular:     Pulses: Normal pulses.  Pulmonary:     Effort: Pulmonary effort is normal.  Musculoskeletal:        General: Tenderness and deformity present.     Cervical back: Neck supple. No tenderness.     Comments: Obvious deformity of L wrist with bleeding abrasion on dorsal wrist; normal ROM L fingers; normal ROM R knee with small abrasion  Skin:    General: Skin is warm and dry.  Neurological:     Mental Status: She is alert and oriented to person, place, and time.  Psychiatric:        Judgment: Judgment normal.     ED Results / Procedures / Treatments   Labs (all labs ordered are listed, but only abnormal results  are displayed) Labs Reviewed  RESP PANEL BY RT-PCR (FLU A&B, COVID) ARPGX2  BASIC METABOLIC PANEL  CBC    EKG None  Radiology DG Forearm Left  Result Date: 08/26/2020 CLINICAL DATA:  LEFT wrist deformity after fall. EXAM: LEFT FOREARM - 2 VIEW COMPARISON:  Wrist evaluation of the same date. FINDINGS: Comminuted impacted intra-articular fracture of the distal radius with complete displacement of the distal radial articular surface relative to proximal radius with associated ulnar styloid fracture, approximately 2 cm overriding at the fracture  site and completely displaced with ventral displacement of the distal fracture relative to proximal fracture as seen on the wrist radiographs. No additional signs of fracture of the forearm. IMPRESSION: Comminuted, ventrally displaced and overriding fracture of the distal radius with associated displaced ulnar styloid fracture as described. Electronically Signed   By: Zetta Bills M.D.   On: 08/26/2020 13:00   DG Wrist Complete Left  Result Date: 08/26/2020 CLINICAL DATA:  Left wrist deformity after fall, initial encounter. EXAM: LEFT WRIST - COMPLETE 3+ VIEW COMPARISON:  None. FINDINGS: Impacted fracture of the distal radius with significant override measuring approximately 1.7 cm. There is over 1 shaft-width ventral displacement of the distal fracture fragment. Displaced ulnar styloid avulsion fracture. Osteopenia. Advanced degenerative changes in the first carpometacarpal joint and scaphoid trapezium trapezoid joint. IMPRESSION: 1. Impacted fracture of the distal radius with override and ventral displacement of the distal fracture fragment. 2. Displaced ulnar styloid avulsion fracture. 3. Osteopenia. 4. First carpometacarpal and scaphoid trapezium trapezoid joint osteoarthritis. Electronically Signed   By: Lorin Picket M.D.   On: 08/26/2020 12:53   DG Knee Complete 4 Views Right  Result Date: 08/26/2020 CLINICAL DATA:  Fall, knee pain. EXAM: RIGHT  KNEE - COMPLETE 4+ VIEW COMPARISON:  None FINDINGS: Mild prepatellar soft tissue swelling. No visible fracture. No joint effusion. Oblique view mildly limited by projection. Signs of vascular calcification in the soft tissues. Chondrocalcinosis and signs of degenerative change. IMPRESSION: 1. No acute fracture or effusion. Mild prepatellar soft tissue swelling. 2. Chondrocalcinosis and signs of degenerative change. Electronically Signed   By: Zetta Bills M.D.   On: 08/26/2020 12:56    Procedures Procedures   Medications Ordered in ED Medications  Tdap (BOOSTRIX) injection 0.5 mL (has no administration in time range)  fentaNYL (SUBLIMAZE) injection 50 mcg (50 mcg Intravenous Given 08/26/20 1318)  fentaNYL (SUBLIMAZE) injection 75 mcg (75 mcg Intravenous Given 08/26/20 1420)  ceFAZolin (ANCEF) IVPB 2g/100 mL premix (2 g Intravenous New Bag/Given 08/26/20 1454)    ED Course  I have reviewed the triage vital signs and the nursing notes.  Pertinent labs & imaging results that were available during my care of the patient were reviewed by me and considered in my medical decision making (see chart for details).    MDM Rules/Calculators/A&P                          PT neurovascularly intact distally. XR shows impacted and displaced distal radius fracture with associated ulnar styloid fracture.  On repeat inspection, the patient has had moderate bleeding from her abrasion which on second evaluation does appear to possibly be subtle open fracture. Added ancef and tdap. Contacted hand, discussed w/ Hilbert Odor. He has discussed w/ Dr. Lenon Curt who will take pt to OR. COVID negative. Final Clinical Impression(s) / ED Diagnoses Final diagnoses:  Pain and swelling of knee, right    Rx / DC Orders ED Discharge Orders    None       Kervin Bones, Wenda Overland, MD 08/26/20 1553

## 2020-08-26 NOTE — Op Note (Signed)
[name], Sandra Cooper MEDICAL RECORD NO: [id number] ACCOUNT NO: [id number] DATE OF BIRTH: 1929-12-15 FACILITY: [facility] LOCATION: [facility] PHYSICIAN: [place] Yeagley C. Lenon Curt, MD  Operative Report   DATE OF PROCEDURE: 08/26/2020  PREOPERATIVE DIAGNOSIS:  Grade I open fracture of the left distal radius and ulnar styloid.  POSTOPERATIVE DIAGNOSIS:  Grade I open fracture of the left distal radius and ulnar styloid.  PROCEDURES:    1.  Open reduction and internal fixation of the distal radius fracture with an Acumed volar plate. 2.  Irrigation and washout of dorsal grade I open wound.  INDICATIONS:  The patient is an 87 year old female who fell this afternoon on outstretched hand sustaining the above-mentioned fracture.  Risks, benefits, and alternatives of treatment options were discussed with her and her son.  Open reduction and  internal fixation was determined to be the best course of action.  Consent was obtained.  DESCRIPTION OF PROCEDURE:  The patient was taken to the operating room and placed supine on the operating room table.  Timeout was performed.  Anesthesia was administered.  The left upper extremity was prepped and draped in normal sterile fashion.  The  tourniquet was used on the upper arm.  The arm was exsanguinated.  The tourniquet was inflated to 250 mmHg after adequate draping.  A volar incision overlying the FCR tendon was made.  Dissection was carried down to the fascia.  The fracture site was  extremely comminuted.  The pronator quadratus muscle was taken off of the radius.  The fracture site was addressed.  The wound was irrigated.  An appropriate size Acumed volar plate was chosen, temporarily held in place with a K-wire while plate length  and orientation was obtained.  Next, the radial shaft screw was drilled, measured, and an appropriate size cortical screw was placed holding the plate in place.  Additional cortical screws were placed in the radial shaft followed by distal radial   threaded locking screws.  Each was drilled, measured, and x-ray confirmation afterwards revealed good screw length without any entry into the joint.  Irrigation was then performed.  Approximately 4 mL of Zynrelef was infiltrated along the plate into the  wound for postoperative pain control.  The deep fascia was closed with interrupted 3-0 Vicryl as well as subcutaneous tissues and the skin was run with a 4-0 nylon in a running locking fashion.  The dorsal hand wound was irrigated with approximately a  liter of saline solution.  The wound was loosely approximated with 4-0 nylon.  The patient was placed in a sterile dressing and volar resting splint.  Estimated blood loss was minimal.  No acute complications.  The patient was awakened and taken to  recovery room stable.   ROH D: 08/26/2020 6:56:24 pm T: 08/26/2020 9:56:00 pm  JOB: [id number]/ [id number]

## 2020-08-26 NOTE — Consult Note (Signed)
Reason for Consult:Left wrist fxx Referring Physician: R Little Time called: 6314 Time at bedside: [place] is an 87 y.o. female.  HPI: Sandra Cooper was walking and was stepping up onto a concrete curb when she tripped and fell onto her left hand. She had immediate pain and deformity was noted. She was brought to the ED where x-rays showed a wrist fx and hand surgery was consulted. It is likely open. She lives at home alone and is RHD.  Past Medical History:  Diagnosis Date  . Arthritis   . Asthma   . Hypertension   . Lung cancer ([place])    adenocarcinoma LLL    Past Surgical History:  Procedure Laterality Date  . APPENDECTOMY    . CT guided lung biopsy    . HERNIA REPAIR      Family History  Problem Relation Age of Onset  . Cancer Neg Hx     Social History:  reports that she has never smoked. She has never used smokeless tobacco. She reports that she does not drink alcohol and does not use drugs.  Allergies:  Allergies  Allergen Reactions  . Fluoxetine Swelling    Medications: I have reviewed the patient's current medications.  No results found for this or any previous visit (from the past 48 hour(s)).  DG Forearm Left  Result Date: 08/26/2020 CLINICAL DATA:  LEFT wrist deformity after fall. EXAM: LEFT FOREARM - 2 VIEW COMPARISON:  Wrist evaluation of the same date. FINDINGS: Comminuted impacted intra-articular fracture of the distal radius with complete displacement of the distal radial articular surface relative to proximal radius with associated ulnar styloid fracture, approximately 2 cm overriding at the fracture site and completely displaced with ventral displacement of the distal fracture relative to proximal fracture as seen on the wrist radiographs. No additional signs of fracture of the forearm. IMPRESSION: Comminuted, ventrally displaced and overriding fracture of the distal radius with associated displaced ulnar styloid fracture as described. Electronically Signed    By: Zetta Bills M.D.   On: 08/26/2020 13:00   DG Wrist Complete Left  Result Date: 08/26/2020 CLINICAL DATA:  Left wrist deformity after fall, initial encounter. EXAM: LEFT WRIST - COMPLETE 3+ VIEW COMPARISON:  None. FINDINGS: Impacted fracture of the distal radius with significant override measuring approximately 1.7 cm. There is over 1 shaft-width ventral displacement of the distal fracture fragment. Displaced ulnar styloid avulsion fracture. Osteopenia. Advanced degenerative changes in the first carpometacarpal joint and scaphoid trapezium trapezoid joint. IMPRESSION: 1. Impacted fracture of the distal radius with override and ventral displacement of the distal fracture fragment. 2. Displaced ulnar styloid avulsion fracture. 3. Osteopenia. 4. First carpometacarpal and scaphoid trapezium trapezoid joint osteoarthritis. Electronically Signed   By: Lorin Picket M.D.   On: 08/26/2020 12:53   DG Knee Complete 4 Views Right  Result Date: 08/26/2020 CLINICAL DATA:  Fall, knee pain. EXAM: RIGHT KNEE - COMPLETE 4+ VIEW COMPARISON:  None FINDINGS: Mild prepatellar soft tissue swelling. No visible fracture. No joint effusion. Oblique view mildly limited by projection. Signs of vascular calcification in the soft tissues. Chondrocalcinosis and signs of degenerative change. IMPRESSION: 1. No acute fracture or effusion. Mild prepatellar soft tissue swelling. 2. Chondrocalcinosis and signs of degenerative change. Electronically Signed   By: Zetta Bills M.D.   On: 08/26/2020 12:56    Review of Systems  HENT: Negative for ear discharge, ear pain, hearing loss and tinnitus.   Eyes: Negative for photophobia and pain.  Respiratory: Negative  for cough and shortness of breath.   Cardiovascular: Negative for chest pain.  Gastrointestinal: Negative for abdominal pain, nausea and vomiting.  Genitourinary: Negative for dysuria, flank pain, frequency and urgency.  Musculoskeletal: Positive for arthralgias (Left  wrist). Negative for back pain, myalgias and neck pain.  Neurological: Negative for dizziness and headaches.  Hematological: Does not bruise/bleed easily.  Psychiatric/Behavioral: The patient is not nervous/anxious.    Blood pressure 124/78, pulse 74, temperature (!) 97.4 F (36.3 C), temperature source Oral, resp. rate 18, height 5' (1.524 m), weight 52.2 kg, SpO2 94 %. Physical Exam Constitutional:      General: She is not in acute distress.    Appearance: She is well-developed. She is not diaphoretic.  HENT:     Head: Normocephalic and atraumatic.  Eyes:     General: No scleral icterus.       Right eye: No discharge.        Left eye: No discharge.     Conjunctiva/sclera: Conjunctivae normal.  Cardiovascular:     Rate and Rhythm: Normal rate and regular rhythm.  Pulmonary:     Effort: Pulmonary effort is normal. No respiratory distress.  Musculoskeletal:     Cervical back: Normal range of motion.     Comments: Left shoulder, elbow, wrist, digits- Dorsal wrist wound, severe deformity, severe TTP, no instability, no blocks to motion  Sens  Ax/R/M/U intact  Mot   Ax/ R/ PIN/ M/ AIN/ U grossly intact  Rad 2+  Skin:    General: Skin is warm and dry.  Neurological:     Mental Status: She is alert.  Psychiatric:        Behavior: Behavior normal.     Assessment/Plan: Left open wrist fx -- Plan I&D, ORIF today with Dr. Lenon Curt. Please keep NPO. Anticipate discharge after surgery.    Lisette Abu, PA-C Orthopedic Surgery [phone] 08/26/2020, 2:23 PM

## 2020-08-26 NOTE — Anesthesia Procedure Notes (Signed)
Procedure Name: LMA Insertion Date/Time: 08/26/2020 5:23 PM Performed by: Lissa Morales, CRNA Pre-anesthesia Checklist: Patient identified, Emergency Drugs available, Suction available, Patient being monitored and Timeout performed Patient Re-evaluated:Patient Re-evaluated prior to induction Oxygen Delivery Method: Circle system utilized Preoxygenation: Pre-oxygenation with 100% oxygen LMA: LMA flexible inserted and LMA with gastric port inserted Laryngoscope size: attemped by Tawni Millers crna #4 proseal, , # 4 and # 3 LMA attempted insertion wby Dr, Royce Macadamia. unable to seat LMA"S so proceeded with GA with ETT #4. Grade View: Grade II Tube type: Oral Tube size: 7.0 mm Number of attempts: 1 Airway Equipment and Method: Stylet Placement Confirmation: ETT inserted through vocal cords under direct vision,  positive ETCO2 and breath sounds checked- equal and bilateral Secured at: 7 cm Tube secured with: Tape Dental Injury: Teeth and Oropharynx as per pre-operative assessment

## 2020-08-26 NOTE — Transfer of Care (Signed)
Immediate Anesthesia Transfer of Care Note  Patient: Sandra Cooper  Procedure(s) Performed: Procedure(s): IRRIGATION AND DEBRIDEMENT EXTREMITY (Left) OPEN REDUCTION INTERNAL FIXATION (ORIF) WRIST FRACTURE (Left)  Patient Location: PACU  Anesthesia Type:General  Level of Consciousness: Alert, Awake, Oriented  Airway & Oxygen Therapy: Patient Spontanous Breathing  Post-op Assessment: Report given to RN  Post vital signs: Reviewed and stable  Last Vitals:  Vitals:   08/26/20 1530 08/26/20 1650  BP: 126/73 (!) 147/88  Pulse: 76 79  Resp: 18 14  Temp:  37.1 C  SpO2: 45% 36%    Complications: No apparent anesthesia complications

## 2020-08-26 NOTE — Anesthesia Preprocedure Evaluation (Addendum)
Anesthesia Evaluation  Patient identified by MRN, date of birth, ID band Patient awake    Reviewed: Allergy & Precautions, NPO status , Patient's Chart, lab work & pertinent test results, reviewed documented beta blocker date and time   Airway Mallampati: II  TM Distance: >3 FB Neck ROM: Full    Dental  (+) Teeth Intact, Caps, Dental Advisory Given, Missing   Pulmonary shortness of breath and with exertion, asthma ,  Primary Lung Ca LLL   Pulmonary exam normal breath sounds clear to auscultation       Cardiovascular hypertension, Pt. on medications Normal cardiovascular exam Rhythm:Regular Rate:Normal     Neuro/Psych negative neurological ROS  negative psych ROS   GI/Hepatic negative GI ROS, Neg liver ROS,   Endo/Other  Hypothyroidism   Renal/GU negative Renal ROS  negative genitourinary   Musculoskeletal  (+) Arthritis , Osteoarthritis,  Open left wrist Fx   Abdominal   Peds  Hematology   Anesthesia Other Findings   Reproductive/Obstetrics                            Anesthesia Physical Anesthesia Plan  ASA: III  Anesthesia Plan: General   Post-op Pain Management:    Induction: Intravenous  PONV Risk Score and Plan: 4 or greater and Treatment may vary due to age or medical condition and Ondansetron  Airway Management Planned: LMA  Additional Equipment: None  Intra-op Plan:   Post-operative Plan:   Informed Consent: I have reviewed the patients History and Physical, chart, labs and discussed the procedure including the risks, benefits and alternatives for the proposed anesthesia with the patient or authorized representative who has indicated his/her understanding and acceptance.     Dental advisory given  Plan Discussed with: CRNA and Anesthesiologist  Anesthesia Plan Comments:         Anesthesia Quick Evaluation

## 2020-08-26 NOTE — ED Triage Notes (Signed)
Patient presents post fall today. She complains of nose, left wrist and right knee pain. Left wrist deformity is present.

## 2020-08-27 ENCOUNTER — Encounter (HOSPITAL_COMMUNITY): Payer: Self-pay | Admitting: General Surgery

## 2020-09-27 ENCOUNTER — Ambulatory Visit (HOSPITAL_COMMUNITY)
Admission: RE | Admit: 2020-09-27 | Discharge: 2020-09-27 | Disposition: A | Discharge status: Home / Self Care | Payer: Medicare HMO | Source: Ambulatory Visit | Attending: Internal Medicine | Admitting: Internal Medicine

## 2020-09-27 ENCOUNTER — Inpatient Hospital Stay: Payer: Medicare HMO | Attending: Internal Medicine

## 2020-09-27 ENCOUNTER — Other Ambulatory Visit: Payer: Self-pay

## 2020-09-27 ENCOUNTER — Encounter (HOSPITAL_COMMUNITY): Payer: Self-pay

## 2020-09-27 DIAGNOSIS — C3490 Malignant neoplasm of unspecified part of unspecified bronchus or lung: Secondary | ICD-10-CM | POA: Diagnosis present

## 2020-09-27 LAB — CBC WITH DIFFERENTIAL (CANCER CENTER ONLY)
Abs Immature Granulocytes: 0.01 10*3/uL (ref 0.00–0.07)
Basophils Absolute: 0.1 10*3/uL (ref 0.0–0.1)
Basophils Relative: 1 %
Eosinophils Absolute: 0.2 10*3/uL (ref 0.0–0.5)
Eosinophils Relative: 2 %
HCT: 41.3 % (ref 36.0–46.0)
Hemoglobin: 13.1 g/dL (ref 12.0–15.0)
Immature Granulocytes: 0 %
Lymphocytes Relative: 25 %
Lymphs Abs: 1.8 10*3/uL (ref 0.7–4.0)
MCH: 27.9 pg (ref 26.0–34.0)
MCHC: 31.7 g/dL (ref 30.0–36.0)
MCV: 87.9 fL (ref 80.0–100.0)
Monocytes Absolute: 0.6 10*3/uL (ref 0.1–1.0)
Monocytes Relative: 9 %
Neutro Abs: 4.4 10*3/uL (ref 1.7–7.7)
Neutrophils Relative %: 63 %
Platelet Count: 348 10*3/uL (ref 150–400)
RBC: 4.7 MIL/uL (ref 3.87–5.11)
RDW: 14.3 % (ref 11.5–15.5)
WBC Count: 7 10*3/uL (ref 4.0–10.5)
nRBC: 0 % (ref 0.0–0.2)

## 2020-09-27 LAB — CMP (CANCER CENTER ONLY)
ALT: 20 U/L (ref 0–44)
AST: 23 U/L (ref 15–41)
Albumin: 3.8 g/dL (ref 3.5–5.0)
Alkaline Phosphatase: 94 U/L (ref 38–126)
Anion gap: 8 (ref 5–15)
BUN: 27 mg/dL — ABNORMAL HIGH (ref 8–23)
CO2: 25 mmol/L (ref 22–32)
Calcium: 9.6 mg/dL (ref 8.9–10.3)
Chloride: 107 mmol/L (ref 98–111)
Creatinine: 0.98 mg/dL (ref 0.44–1.00)
GFR, Estimated: 56 mL/min — ABNORMAL LOW (ref >60)
Glucose, Bld: 98 mg/dL (ref 70–99)
Potassium: 4.3 mmol/L (ref 3.5–5.1)
Sodium: 140 mmol/L (ref 135–145)
Total Bilirubin: 0.5 mg/dL (ref 0.3–1.2)
Total Protein: 7.1 g/dL (ref 6.5–8.1)

## 2020-09-27 MED ORDER — IOHEXOL 300 MG/ML  SOLN
75.0000 mL | Freq: Once | INTRAMUSCULAR | Status: AC | PRN
  Administered 2020-09-27: 75 mL via INTRAVENOUS

## 2020-10-01 ENCOUNTER — Telehealth: Payer: Self-pay | Admitting: Medical Oncology

## 2020-10-01 NOTE — Telephone Encounter (Signed)
Asking for results of CT chest -No F/U appt was ordered but not made. Do you want me to schedule her ?

## 2020-10-02 ENCOUNTER — Telehealth: Payer: Self-pay | Admitting: Internal Medicine

## 2020-10-02 NOTE — Telephone Encounter (Signed)
Scheduled appt per 5/4 sch msg. Called pt, no answer. VM was full. Mailed updated calendar to pt.

## 2020-10-21 ENCOUNTER — Telehealth: Payer: Self-pay | Admitting: Internal Medicine

## 2020-10-21 ENCOUNTER — Inpatient Hospital Stay: Payer: Medicare HMO | Attending: Internal Medicine | Admitting: Internal Medicine

## 2020-10-21 ENCOUNTER — Other Ambulatory Visit: Payer: Self-pay

## 2020-10-21 VITALS — BP 113/83 | HR 73 | Temp 98.8°F | Resp 19 | Ht 59.0 in | Wt 116.0 lb

## 2020-10-21 DIAGNOSIS — Z79899 Other long term (current) drug therapy: Secondary | ICD-10-CM | POA: Diagnosis not present

## 2020-10-21 DIAGNOSIS — C3492 Malignant neoplasm of unspecified part of left bronchus or lung: Secondary | ICD-10-CM | POA: Insufficient documentation

## 2020-10-21 DIAGNOSIS — Z923 Personal history of irradiation: Secondary | ICD-10-CM | POA: Insufficient documentation

## 2020-10-21 DIAGNOSIS — C3490 Malignant neoplasm of unspecified part of unspecified bronchus or lung: Secondary | ICD-10-CM

## 2020-10-21 DIAGNOSIS — I10 Essential (primary) hypertension: Secondary | ICD-10-CM | POA: Insufficient documentation

## 2020-10-21 DIAGNOSIS — C3432 Malignant neoplasm of lower lobe, left bronchus or lung: Secondary | ICD-10-CM

## 2020-10-21 NOTE — Progress Notes (Signed)
[place] [phone]) [phone]   [phone]  OFFICE PROGRESS NOTE  [name], MD [place] [address]  DIAGNOSIS:  stage Ia (T1c, N0, M0) non-small cell lung cancer, adenocarcinoma presented with left lower lobe lung nodule diagnosed in October 2020. The patient is not a good surgical candidate for resection.  She has a complicated event after her CT-guided core biopsy with severe respiratory failure.  PRIOR THERAPY: Status post curative SBRT to the left lower lobe lung nodule under the care of Dr. Tammi Klippel completed on April 11, 2019.  CURRENT THERAPY: Observation.  INTERVAL HISTORY: Sandra Cooper 87 y.o. female returns to the clinic today for follow-up visit accompanied by her son.  The patient is feeling fine today with no concerning complaints except for mild cough.  She denied having any chest pain, shortness of breath or hemoptysis.  She denied having any weight loss or night sweats.  She has no nausea, vomiting, diarrhea or constipation.  She has no headache or visual changes.  The patient had repeat CT scan of the chest performed recently and she is here today for evaluation and discussion of her risk her results.  MEDICAL HISTORY: Past Medical History:  Diagnosis Date  . Arthritis   . Asthma   . Hypertension   . Lung cancer ([place])    adenocarcinoma LLL    ALLERGIES:  is allergic to fluoxetine.  MEDICATIONS:  Current Outpatient Medications  Medication Sig Dispense Refill  . acetaminophen (TYLENOL) 325 MG tablet Take 2 tablets (650 mg total) by mouth every 6 (six) hours as needed for mild pain (or Fever >/= 101).    Marland Kitchen albuterol (VENTOLIN HFA) 108 (90 Base) MCG/ACT inhaler Inhale 2 puffs into the lungs every 6 (six) hours as needed for wheezing or shortness of breath. 6.7 g 2  . alum & mag hydroxide-simeth (MAALOX/MYLANTA) 200-200-20 MG/5ML suspension Take 15 mLs by mouth every 6 (six) hours as needed for indigestion  or heartburn. (Patient not taking: No sig reported) 355 mL 0  . amLODipine (NORVASC) 10 MG tablet Take 10 mg by mouth daily.    . ASPIRIN PO Take 1 tablet by mouth daily.    . budesonide-formoterol (SYMBICORT) 160-4.5 MCG/ACT inhaler Inhale 2 puffs into the lungs 2 (two) times daily. (Patient taking differently: Inhale 1 puff into the lungs in the morning and at bedtime.) 1 Inhaler 6  . Calcium Carb-Cholecalciferol (CALCIUM-VITAMIN D) 600-400 MG-UNIT TABS Take 1 tablet by mouth daily.    . Camphor-Menthol-Methyl Sal (SALONPAS) 3.06-06-08 % PTCH Place 1 patch onto the skin daily as needed (to painful site).    . Cholecalciferol (VITAMIN D) 50 MCG (2000 UT) CAPS Take 2,000 Units by mouth daily.    Marland Kitchen DHA-Vitamin C-Lutein (EYE HEALTH FORMULA PO) Take 1 capsule by mouth daily.    Marland Kitchen losartan (COZAAR) 100 MG tablet Take 100 mg by mouth every evening.    . magnesium gluconate (MAGONATE) 500 MG tablet Take 500-1,000 mg by mouth daily.    . mirtazapine (REMERON) 15 MG tablet Take 15 mg by mouth at bedtime.    . Multiple Vitamin (MULTIVITAMIN) tablet Take 1 tablet by mouth daily.    . Omega-3-6-9 CAPS Take 1 capsule by mouth 2 (two) times daily.     Marland Kitchen OVER THE COUNTER MEDICATION Take 1 tablet by mouth See admin instructions. Life Extension, Brain Support Supplement tablets- Take 1 tablet by mouth once a day    .  Potassium 99 MG TABS Take 99 mg by mouth daily.    Marland Kitchen Spacer/Aero-Holding Chambers (AEROCHAMBER MV) inhaler Use as instructed 1 each 0  . vitamin B-12 (CYANOCOBALAMIN) 500 MCG tablet Take 500 mcg by mouth daily.     No current facility-administered medications for this visit.    SURGICAL HISTORY:  Past Surgical History:  Procedure Laterality Date  . APPENDECTOMY    . CT guided lung biopsy    . HERNIA REPAIR    . I & D EXTREMITY Left 08/26/2020   Procedure: IRRIGATION AND DEBRIDEMENT EXTREMITY;  Surgeon: Dayna Barker, MD;  Location: [facility];  Service: Plastics;  Laterality: Left;  . ORIF WRIST  FRACTURE Left 08/26/2020   Procedure: OPEN REDUCTION INTERNAL FIXATION (ORIF) WRIST FRACTURE;  Surgeon: Dayna Barker, MD;  Location: [facility];  Service: Plastics;  Laterality: Left;    REVIEW OF SYSTEMS:  A comprehensive review of systems was negative except for: Respiratory: positive for cough   PHYSICAL EXAMINATION: General appearance: alert, cooperative and no distress Head: Normocephalic, without obvious abnormality, atraumatic Neck: no adenopathy, no JVD, supple, symmetrical, trachea midline and thyroid not enlarged, symmetric, no tenderness/mass/nodules Lymph nodes: Cervical, supraclavicular, and axillary nodes normal. Resp: clear to auscultation bilaterally Back: symmetric, no curvature. ROM normal. No CVA tenderness. Cardio: regular rate and rhythm, S1, S2 normal, no murmur, click, rub or gallop GI: soft, non-tender; bowel sounds normal; no masses,  no organomegaly Extremities: extremities normal, atraumatic, no cyanosis or edema  ECOG PERFORMANCE STATUS: 1 - Symptomatic but completely ambulatory  Blood pressure 113/83, pulse 73, temperature 98.8 F (37.1 C), resp. rate 19, height 4\' 11"  (1.499 m), weight 116 lb (52.6 kg), SpO2 94 %.  LABORATORY DATA: Lab Results  Component Value Date   WBC 7.0 09/27/2020   HGB 13.1 09/27/2020   HCT 41.3 09/27/2020   MCV 87.9 09/27/2020   PLT 348 09/27/2020      Chemistry      Component Value Date/Time   NA 140 09/27/2020 1453   K 4.3 09/27/2020 1453   CL 107 09/27/2020 1453   CO2 25 09/27/2020 1453   BUN 27 (H) 09/27/2020 1453   CREATININE 0.98 09/27/2020 1453      Component Value Date/Time   CALCIUM 9.6 09/27/2020 1453   ALKPHOS 94 09/27/2020 1453   AST 23 09/27/2020 1453   ALT 20 09/27/2020 1453   BILITOT 0.5 09/27/2020 1453       RADIOGRAPHIC STUDIES: CT Chest W Contrast  Result Date: 09/30/2020 CLINICAL DATA:  Follow-up left non-small cell lung carcinoma. Status post SBRT. EXAM: CT CHEST WITH CONTRAST TECHNIQUE:  Multidetector CT imaging of the chest was performed during intravenous contrast administration. CONTRAST:  66mL OMNIPAQUE IOHEXOL 300 MG/ML  SOLN COMPARISON:  03/01/2020 FINDINGS: Cardiovascular: No acute findings. Aortic and coronary atherosclerotic calcification noted. Mediastinum/Nodes: No pathologically enlarged lymph nodes identified. 2 cm low-attenuation left thyroid lobe nodule remains stable since prior PET-CT on 02/15/2019, when it showed absence of FDG uptake. This is not clinically significant, and no follow-up imaging recommended. Lungs/Pleura: Masslike opacity in the left lower lobe abutting the diaphragm shows slight decrease in size, currently measuring 3.1 x 2.3 cm compared to 3.8 x 2.4 cm previously. No other suspicious pulmonary nodules or masses are identified. No evidence of acute infiltrate or pleural effusion. Upper Abdomen: Small hepatic cysts again noted. Normal appearance of both adrenal glands. Musculoskeletal:  No suspicious bone lesions. IMPRESSION: Slight decrease in size of left lower lobe lung mass. No evidence of  metastatic disease or other acute findings. Aortic Atherosclerosis (ICD10-I70.0). Electronically Signed   By: Marlaine Hind M.D.   On: 09/30/2020 09:04    ASSESSMENT AND PLAN: This is a very pleasant 87 years old white female with history of stage Ia non-small cell lung cancer, adenocarcinoma diagnosed in October 2020 status post SBRT to the left lower lobe lung nodule under the care of Dr. Tammi Klippel completed in November 2020. The patient is currently on observation and she is feeling fine today with no concerning complaints except for intermittent cough. She had repeat CT scan of the chest performed recently.  I personally and independently reviewed the scans and discussed the results with the patient and her son. Her scan showed no concerning findings for disease recurrence or metastasis and there was a slight decrease in the size of the left lower lobe lung mass. I  recommended for the patient to continue on observation with repeat CT scan of the chest in 1 year. She was advised to call immediately if she has any other concerning symptoms in the interval. The patient voices understanding of current disease status and treatment options and is in agreement with the current care plan.  All questions were answered. The patient knows to call the clinic with any problems, questions or concerns. We can certainly see the patient much sooner if necessary.  Disclaimer: This note was dictated with voice recognition software. Similar sounding words can inadvertently be transcribed and may not be corrected upon review.

## 2020-10-21 NOTE — Telephone Encounter (Signed)
Scheduled per 05/23 los, patient received updated calender.

## 2021-02-20 ENCOUNTER — Telehealth: Payer: Self-pay | Admitting: Medical Oncology

## 2021-02-20 NOTE — Telephone Encounter (Signed)
Left sided chest pain and rib pain.  Dr Melford Aase instructed her to contact Dr Julien Nordmann  for appt.   09/13-Per Dr Melford Aase, "I remain concerned about recurrent cancer and feel Sandra Cooper needs to follow up with her oncologist Dr. Inda Merlin for further eval and discussion. I have notifed her and have asked my staff to help facilitate an appt soon".   Next appt is May.

## 2021-03-13 ENCOUNTER — Telehealth: Payer: Self-pay | Admitting: Medical Oncology

## 2021-03-13 NOTE — Telephone Encounter (Addendum)
Pt requests appt. To  f/u chest pain she had and then heard something crack in chest /rib area. She saw Dr Melford Aase who from what I can understand from her did a cxr.   I requested records from Dr Melford Aase .

## 2021-03-18 ENCOUNTER — Telehealth: Payer: Self-pay | Admitting: Medical Oncology

## 2021-03-18 NOTE — Telephone Encounter (Signed)
Request for appt soon . Dx-"new left rib lesion ".  I returned call and asked to fax imaging for rib lesion.

## 2021-03-19 ENCOUNTER — Telehealth: Payer: Self-pay | Admitting: Internal Medicine

## 2021-03-19 IMAGING — US US THYROID
1 series · 13 of 25 positions shown · non-contrast
Comparison: None.

CLINICAL DATA: 88-year-old female with a thyroid nodule

EXAM:
THYROID ULTRASOUND
TECHNIQUE: Ultrasound examination of the thyroid gland and adjacent soft
tissues was performed.

[Series 1: us thyroid · 13 of 66 slices shown]
[im 1/66]
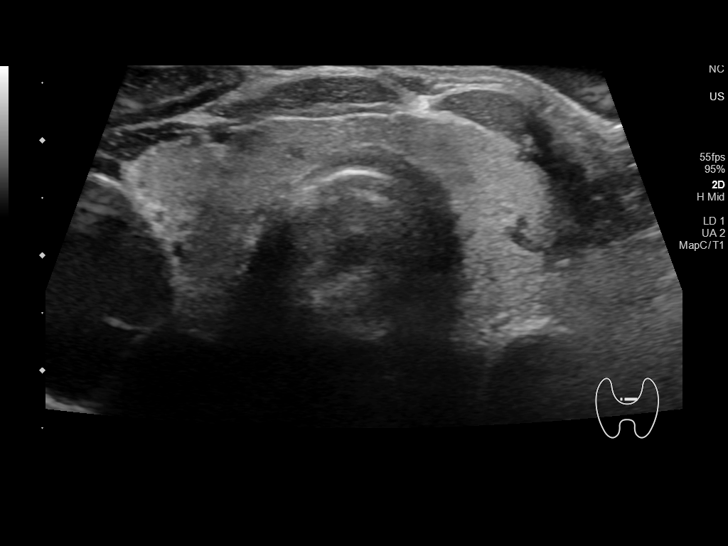
[im 6/66]
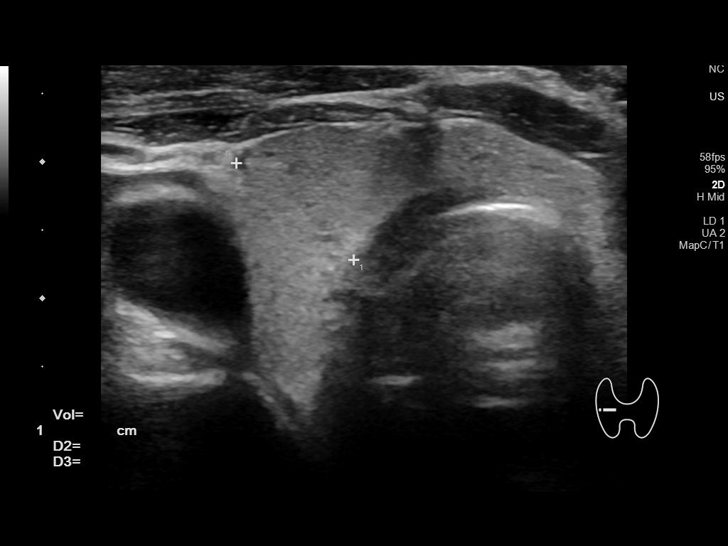
[im 11/66]
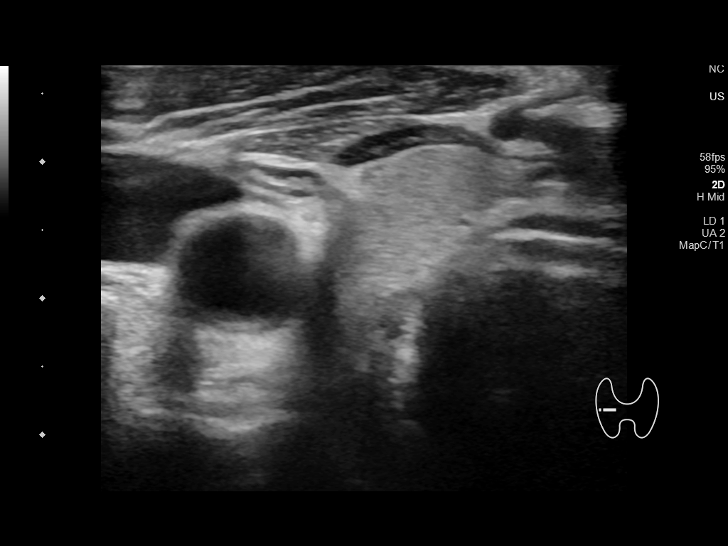
[im 17/66]
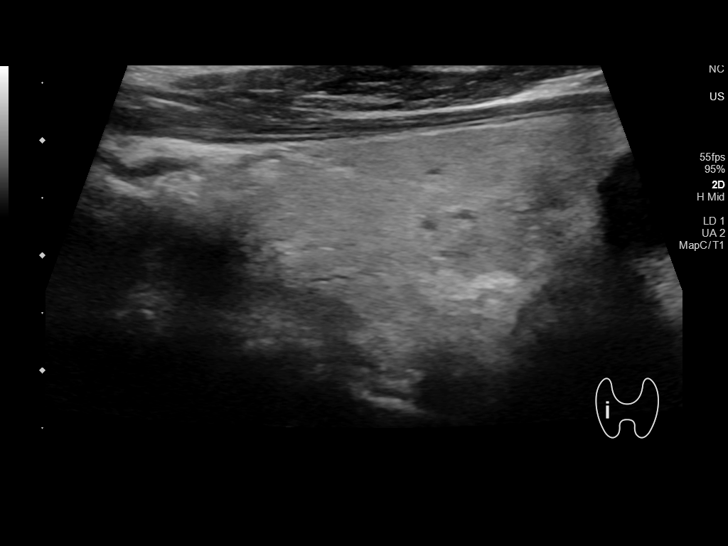
[im 22/66]
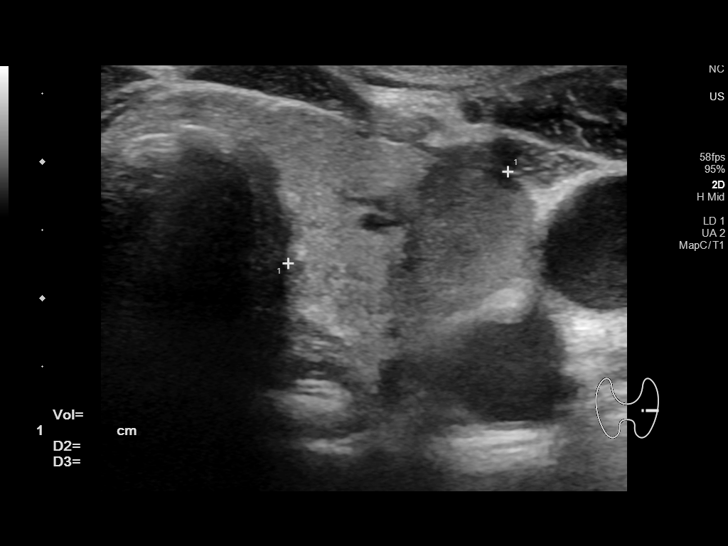
[im 28/66]
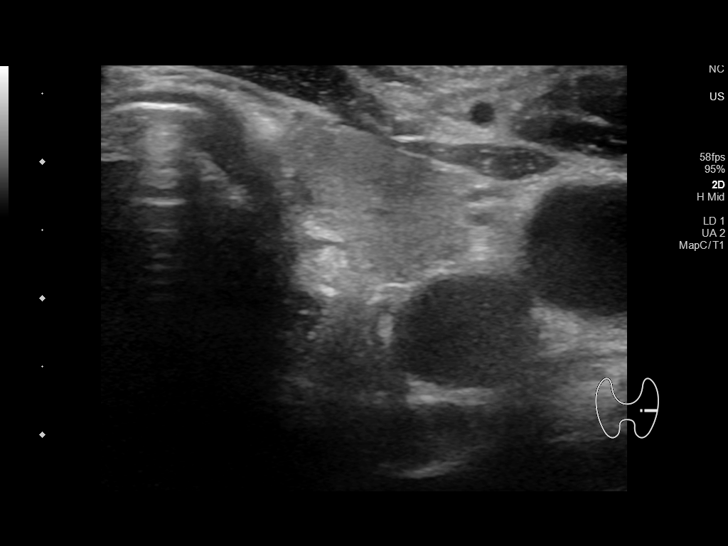
[im 33/66]
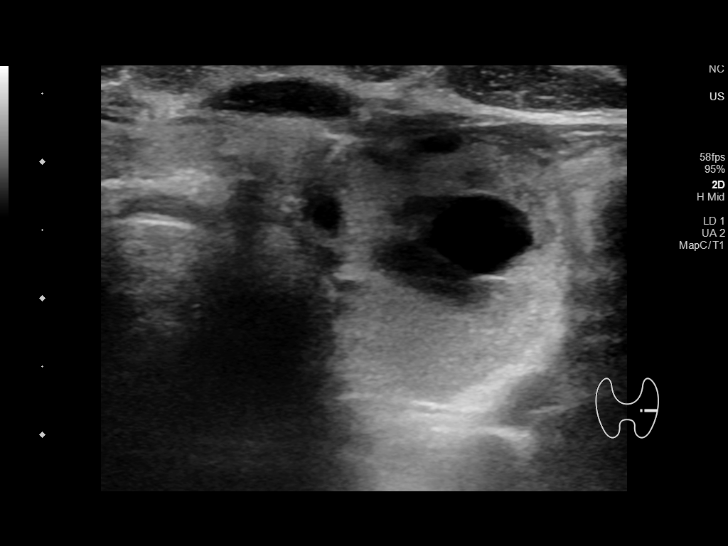
[im 38/66]
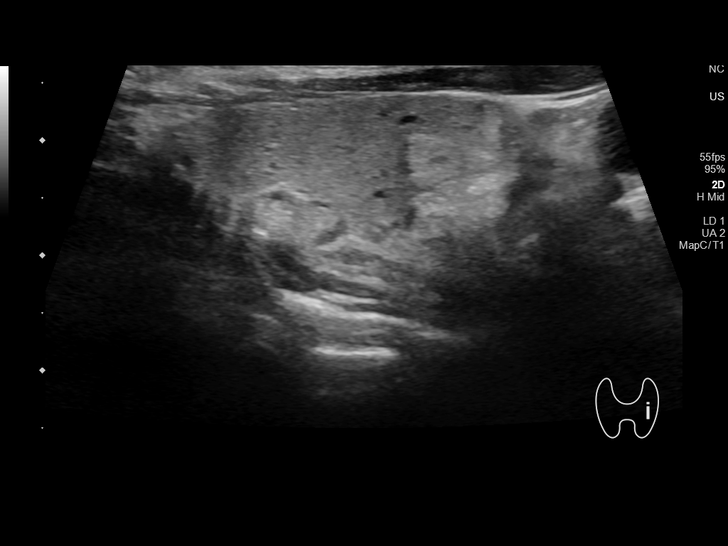
[im 44/66]
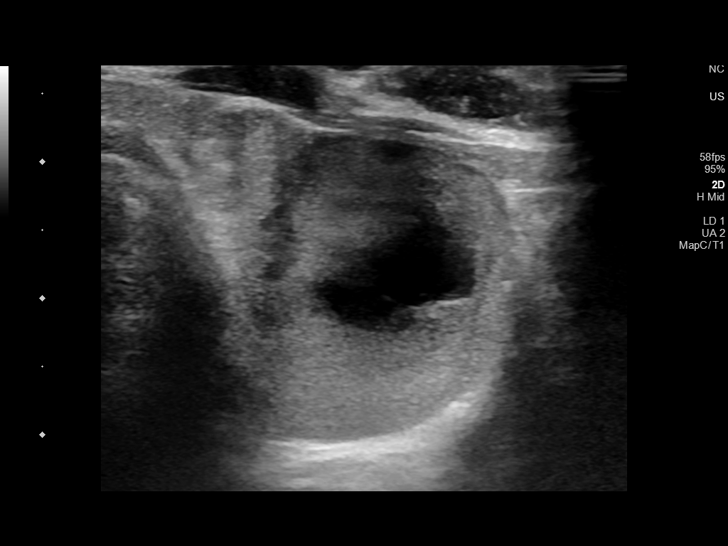
[im 49/66]
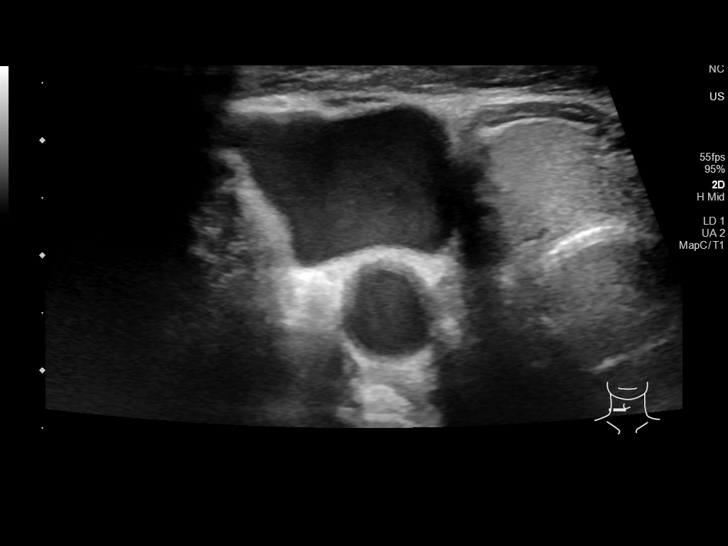
[im 55/66]
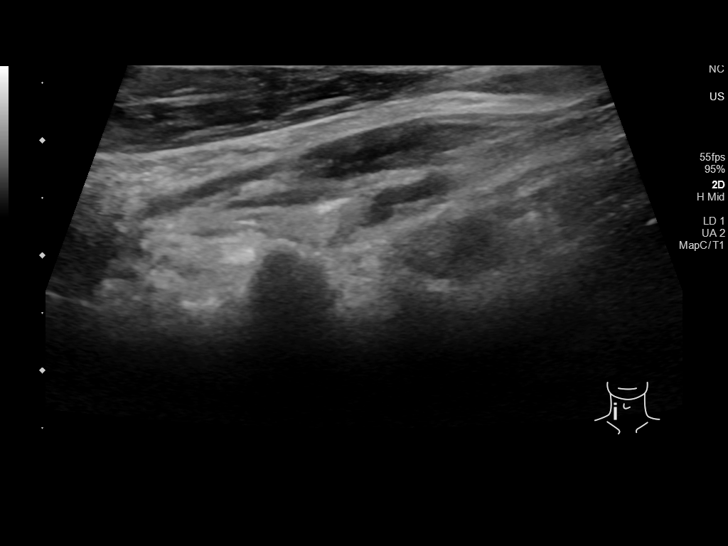
[im 60/66]
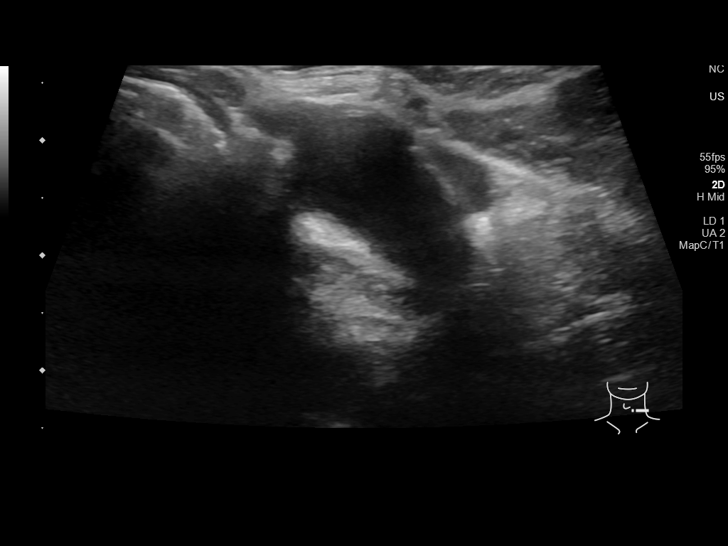
[im 66/66]
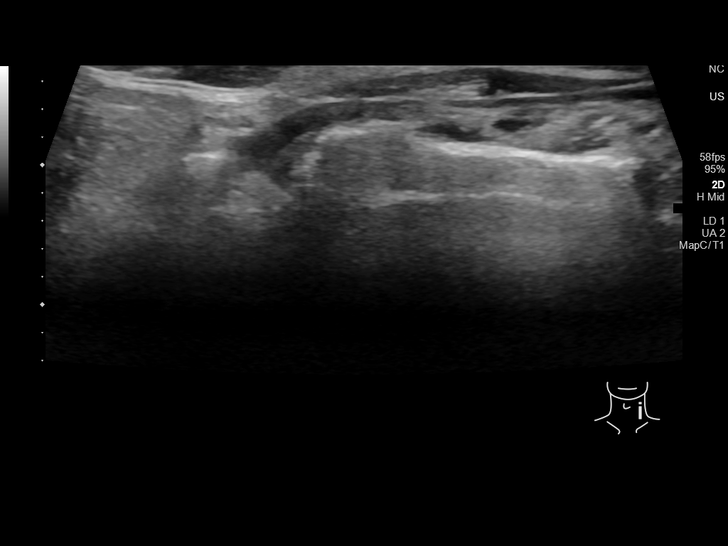

[13 of 25 positions shown; findings below may reference images not displayed]

FINDINGS: Parenchymal Echotexture: Normal

Isthmus: 3.4 cm

Right lobe: 3.6 cm x 1.4 cm x 1.1 cm

Left lobe: 4.2 cm x 1.8 cm x 1.7 cm

_________________________________________________________

Estimated total number of nodules >/= 1 cm: 1

Number of spongiform nodules >/=  2 cm not described below (TR1): 0

Number of mixed cystic and solid nodules >/= 1.5 cm not described
below (TR2): 0

_________________________________________________________

Nodule # 1:

Location: Left; Inferior

Maximum size: 2.6 cm; Other 2 dimensions: 2.1 cm x 2.1 cm

Composition: mixed cystic and solid (1)

Echogenicity: isoechoic (1)

Shape: taller-than-wide (3)

Margins: smooth (0)

Echogenic foci: none (0)

ACR TI-RADS total points: 5.

ACR TI-RADS risk category: TR4 (4-6 points).

ACR TI-RADS recommendations:

Nodule meets criteria for biopsy

_________________________________________________________

No adenopathy
IMPRESSION: Left inferior thyroid nodule (labeled 1, TR 4) meets criteria for
biopsy, as designated by the newly established ACR TI-RADS criteria,
and referral for biopsy is recommended.

Recommendations follow those established by the new ACR TI-RADS
criteria ([HOSPITAL] 4549;[DATE]).

## 2021-03-19 NOTE — Telephone Encounter (Signed)
Scheduled per sch mg. Called, not able to leave msg. Will call again

## 2021-03-20 ENCOUNTER — Ambulatory Visit: Payer: Medicare HMO | Admitting: Internal Medicine

## 2021-03-20 ENCOUNTER — Other Ambulatory Visit: Payer: Medicare HMO

## 2021-03-24 ENCOUNTER — Telehealth: Payer: Self-pay

## 2021-03-24 NOTE — Telephone Encounter (Signed)
Pts son called stating he was contacted by our scheduling department to schedule a follow-up appt. With pt and her son on the phone, she states she requested from her PCP to be seen here in Oncology because of her rib pain but has learned the rib is broken and no longer needs to be seen here in [place]. Pt states she wants to talk to her PCP again before scheduling an appt here at the [facility].

## 2021-03-25 ENCOUNTER — Telehealth: Payer: Self-pay | Admitting: Internal Medicine

## 2021-03-25 NOTE — Telephone Encounter (Signed)
I confirmed with pts son again that pt does not wish to be seen at this time. I have cancelled the appts as requested.

## 2021-03-25 NOTE — Telephone Encounter (Signed)
Scheduled per sch msg. Called and spoke with patient. Confirmed appt  

## 2021-03-26 ENCOUNTER — Ambulatory Visit: Payer: Medicare HMO | Admitting: Internal Medicine

## 2021-03-26 ENCOUNTER — Other Ambulatory Visit: Payer: Medicare HMO

## 2021-04-01 ENCOUNTER — Inpatient Hospital Stay: Payer: Medicare HMO | Attending: Internal Medicine

## 2021-04-01 ENCOUNTER — Inpatient Hospital Stay: Payer: Medicare HMO | Admitting: Internal Medicine

## 2021-04-01 ENCOUNTER — Other Ambulatory Visit: Payer: Self-pay

## 2021-04-01 VITALS — BP 126/78 | HR 63 | Temp 98.2°F | Resp 17 | Wt 117.2 lb

## 2021-04-01 DIAGNOSIS — R091 Pleurisy: Secondary | ICD-10-CM | POA: Insufficient documentation

## 2021-04-01 DIAGNOSIS — R079 Chest pain, unspecified: Secondary | ICD-10-CM | POA: Insufficient documentation

## 2021-04-01 DIAGNOSIS — Z9181 History of falling: Secondary | ICD-10-CM | POA: Diagnosis not present

## 2021-04-01 DIAGNOSIS — C3432 Malignant neoplasm of lower lobe, left bronchus or lung: Secondary | ICD-10-CM | POA: Diagnosis not present

## 2021-04-01 DIAGNOSIS — Z79899 Other long term (current) drug therapy: Secondary | ICD-10-CM | POA: Insufficient documentation

## 2021-04-01 DIAGNOSIS — I10 Essential (primary) hypertension: Secondary | ICD-10-CM | POA: Insufficient documentation

## 2021-04-01 DIAGNOSIS — Z923 Personal history of irradiation: Secondary | ICD-10-CM | POA: Diagnosis not present

## 2021-04-01 DIAGNOSIS — C3490 Malignant neoplasm of unspecified part of unspecified bronchus or lung: Secondary | ICD-10-CM

## 2021-04-01 LAB — CMP (CANCER CENTER ONLY)
ALT: 14 U/L (ref 0–44)
AST: 18 U/L (ref 15–41)
Albumin: 3.6 g/dL (ref 3.5–5.0)
Alkaline Phosphatase: 83 U/L (ref 38–126)
Anion gap: 6 (ref 5–15)
BUN: 31 mg/dL — ABNORMAL HIGH (ref 8–23)
CO2: 27 mmol/L (ref 22–32)
Calcium: 9.4 mg/dL (ref 8.9–10.3)
Chloride: 105 mmol/L (ref 98–111)
Creatinine: 1.11 mg/dL — ABNORMAL HIGH (ref 0.44–1.00)
GFR, Estimated: 48 mL/min — ABNORMAL LOW (ref >60)
Glucose, Bld: 112 mg/dL — ABNORMAL HIGH (ref 70–99)
Potassium: 5 mmol/L (ref 3.5–5.1)
Sodium: 138 mmol/L (ref 135–145)
Total Bilirubin: 0.4 mg/dL (ref 0.3–1.2)
Total Protein: 6.8 g/dL (ref 6.5–8.1)

## 2021-04-01 LAB — CBC WITH DIFFERENTIAL (CANCER CENTER ONLY)
Abs Immature Granulocytes: 0.03 10*3/uL (ref 0.00–0.07)
Basophils Absolute: 0.1 10*3/uL (ref 0.0–0.1)
Basophils Relative: 1 %
Eosinophils Absolute: 0.2 10*3/uL (ref 0.0–0.5)
Eosinophils Relative: 3 %
HCT: 41.5 % (ref 36.0–46.0)
Hemoglobin: 13.4 g/dL (ref 12.0–15.0)
Immature Granulocytes: 0 %
Lymphocytes Relative: 22 %
Lymphs Abs: 1.5 10*3/uL (ref 0.7–4.0)
MCH: 28.6 pg (ref 26.0–34.0)
MCHC: 32.3 g/dL (ref 30.0–36.0)
MCV: 88.5 fL (ref 80.0–100.0)
Monocytes Absolute: 0.7 10*3/uL (ref 0.1–1.0)
Monocytes Relative: 10 %
Neutro Abs: 4.5 10*3/uL (ref 1.7–7.7)
Neutrophils Relative %: 64 %
Platelet Count: 361 10*3/uL (ref 150–400)
RBC: 4.69 MIL/uL (ref 3.87–5.11)
RDW: 14.6 % (ref 11.5–15.5)
WBC Count: 7.1 10*3/uL (ref 4.0–10.5)
nRBC: 0 % (ref 0.0–0.2)

## 2021-04-01 NOTE — Progress Notes (Signed)
[place] Telephone:([phone]   Fax:([phone]  OFFICE PROGRESS NOTE  [name], MD [place] [address]  DIAGNOSIS:  stage Ia (T1c, N0, M0) non-small cell lung cancer, adenocarcinoma presented with left lower lobe lung nodule diagnosed in October 2020. The patient is not a good surgical candidate for resection.  She has a complicated event after her CT-guided core biopsy with severe respiratory failure.  PRIOR THERAPY: Status post curative SBRT to the left lower lobe lung nodule under the care of Dr. Tammi Klippel completed on April 11, 2019.  CURRENT THERAPY: Observation.  INTERVAL HISTORY: Damoni Erker 87 y.o. female returns to the clinic today for follow-up visit complaining of left-sided chest pain.  The patient had a fall in in September 2022 and had fracture of the left eighth rib as well as distal radius fracture.  She was concerned about recurrence of her disease.  She came today for evaluation and recommendation regarding her condition.  She denied having any shortness of breath except with exertion with no cough or hemoptysis.  She denied having any fever or chills.  She has no nausea, vomiting, diarrhea or constipation.  She has no headache or visual changes.  She has no weight loss or night sweats.  MEDICAL HISTORY: Past Medical History:  Diagnosis Date   Arthritis    Asthma    Hypertension    Lung cancer ([place])    adenocarcinoma LLL    ALLERGIES:  is allergic to fluoxetine.  MEDICATIONS:  Current Outpatient Medications  Medication Sig Dispense Refill   acetaminophen (TYLENOL) 325 MG tablet Take 2 tablets (650 mg total) by mouth every 6 (six) hours as needed for mild pain (or Fever >/= 101).     albuterol (VENTOLIN HFA) 108 (90 Base) MCG/ACT inhaler Inhale 2 puffs into the lungs every 6 (six) hours as needed for wheezing or shortness of breath. 6.7 g 2   alum & mag hydroxide-simeth (MAALOX/MYLANTA) 200-200-20 MG/5ML  suspension Take 15 mLs by mouth every 6 (six) hours as needed for indigestion or heartburn. (Patient not taking: No sig reported) 355 mL 0   amLODipine (NORVASC) 10 MG tablet Take 10 mg by mouth daily.     ASPIRIN PO Take 1 tablet by mouth daily.     budesonide-formoterol (SYMBICORT) 160-4.5 MCG/ACT inhaler Inhale 2 puffs into the lungs 2 (two) times daily. (Patient taking differently: Inhale 1 puff into the lungs in the morning and at bedtime.) 1 Inhaler 6   Calcium Carb-Cholecalciferol (CALCIUM-VITAMIN D) 600-400 MG-UNIT TABS Take 1 tablet by mouth daily.     Camphor-Menthol-Methyl Sal (SALONPAS) 3.06-06-08 % PTCH Place 1 patch onto the skin daily as needed (to painful site).     Cholecalciferol (VITAMIN D) 50 MCG (2000 UT) CAPS Take 2,000 Units by mouth daily.     DHA-Vitamin C-Lutein (EYE HEALTH FORMULA PO) Take 1 capsule by mouth daily.     losartan (COZAAR) 100 MG tablet Take 100 mg by mouth every evening.     magnesium gluconate (MAGONATE) 500 MG tablet Take 500-1,000 mg by mouth daily.     mirtazapine (REMERON) 15 MG tablet Take 15 mg by mouth at bedtime.     Multiple Vitamin (MULTIVITAMIN) tablet Take 1 tablet by mouth daily.     Omega-3-6-9 CAPS Take 1 capsule by mouth 2 (two) times daily.      OVER THE COUNTER MEDICATION Take 1 tablet by mouth See admin instructions. Life Extension, Brain  Support Supplement tablets- Take 1 tablet by mouth once a day     Potassium 99 MG TABS Take 99 mg by mouth daily.     Spacer/Aero-Holding Chambers (AEROCHAMBER MV) inhaler Use as instructed 1 each 0   vitamin B-12 (CYANOCOBALAMIN) 500 MCG tablet Take 500 mcg by mouth daily.     No current facility-administered medications for this visit.    SURGICAL HISTORY:  Past Surgical History:  Procedure Laterality Date   APPENDECTOMY     CT guided lung biopsy     HERNIA REPAIR     I & D EXTREMITY Left 08/26/2020   Procedure: IRRIGATION AND DEBRIDEMENT EXTREMITY;  Surgeon: Dayna Barker, MD;  Location: [facility];  Service: Plastics;  Laterality: Left;   ORIF WRIST FRACTURE Left 08/26/2020   Procedure: OPEN REDUCTION INTERNAL FIXATION (ORIF) WRIST FRACTURE;  Surgeon: Dayna Barker, MD;  Location: [facility];  Service: Plastics;  Laterality: Left;    REVIEW OF SYSTEMS:  A comprehensive review of systems was negative except for: Respiratory: positive for pleurisy/chest pain   PHYSICAL EXAMINATION: General appearance: alert, cooperative, and no distress Head: Normocephalic, without obvious abnormality, atraumatic Neck: no adenopathy, no JVD, supple, symmetrical, trachea midline, and thyroid not enlarged, symmetric, no tenderness/mass/nodules Lymph nodes: Cervical, supraclavicular, and axillary nodes normal. Resp: clear to auscultation bilaterally Back: symmetric, no curvature. ROM normal. No CVA tenderness. Cardio: regular rate and rhythm, S1, S2 normal, no murmur, click, rub or gallop GI: soft, non-tender; bowel sounds normal; no masses,  no organomegaly Extremities: extremities normal, atraumatic, no cyanosis or edema  ECOG PERFORMANCE STATUS: 1 - Symptomatic but completely ambulatory  Blood pressure 126/78, pulse 63, temperature 98.2 F (36.8 C), temperature source Tympanic, resp. rate 17, weight 117 lb 3 oz (53.2 kg), SpO2 100 %.  LABORATORY DATA: Lab Results  Component Value Date   WBC 7.1 04/01/2021   HGB 13.4 04/01/2021   HCT 41.5 04/01/2021   MCV 88.5 04/01/2021   PLT 361 04/01/2021      Chemistry      Component Value Date/Time   NA 140 09/27/2020 1453   K 4.3 09/27/2020 1453   CL 107 09/27/2020 1453   CO2 25 09/27/2020 1453   BUN 27 (H) 09/27/2020 1453   CREATININE 0.98 09/27/2020 1453      Component Value Date/Time   CALCIUM 9.6 09/27/2020 1453   ALKPHOS 94 09/27/2020 1453   AST 23 09/27/2020 1453   ALT 20 09/27/2020 1453   BILITOT 0.5 09/27/2020 1453       RADIOGRAPHIC STUDIES: No results found.   ASSESSMENT AND PLAN: This is a very pleasant 86 years old white  female with history of stage Ia non-small cell lung cancer, adenocarcinoma diagnosed in October 2020 status post SBRT to the left lower lobe lung nodule under the care of Dr. Tammi Klippel completed in November 2020. The patient has been in observation since that time and she is feeling fine except for the recent left-sided chest pain which likely secondary to the fracture of the left eighth rib after her fall. I reviewed her imaging studies from [facility] and likely her symptoms are related to the fracture. I gave the patient the option of repeating CT scan of the chest next week versus continuous observation and waiting for her scheduled the scan in May 2023. After discussion with the patient and her son, she would like to wait until her routine scheduled scan next year. She was also advised to call immediately if she notices  any concerning changes or if she changes her mind regarding having the scan done earlier.  The patient voices understanding of current disease status and treatment options and is in agreement with the current care plan.  All questions were answered. The patient knows to call the clinic with any problems, questions or concerns. We can certainly see the patient much sooner if necessary.  Disclaimer: This note was dictated with voice recognition software. Similar sounding words can inadvertently be transcribed and may not be corrected upon review.

## 2021-04-03 ENCOUNTER — Telehealth: Payer: Self-pay | Admitting: Internal Medicine

## 2021-04-03 NOTE — Telephone Encounter (Signed)
Scheduled follow-up appointments per 11/1 los. Patient is aware.

## 2021-04-10 IMAGING — DX DG CHEST 1V PORT
1 series · 1 of 1 positions shown · non-contrast
Comparison: January 31, 2019.

CLINICAL DATA: Hemoptysis.  Lung biopsy.

EXAM:
PORTABLE CHEST 1 VIEW

[chest]
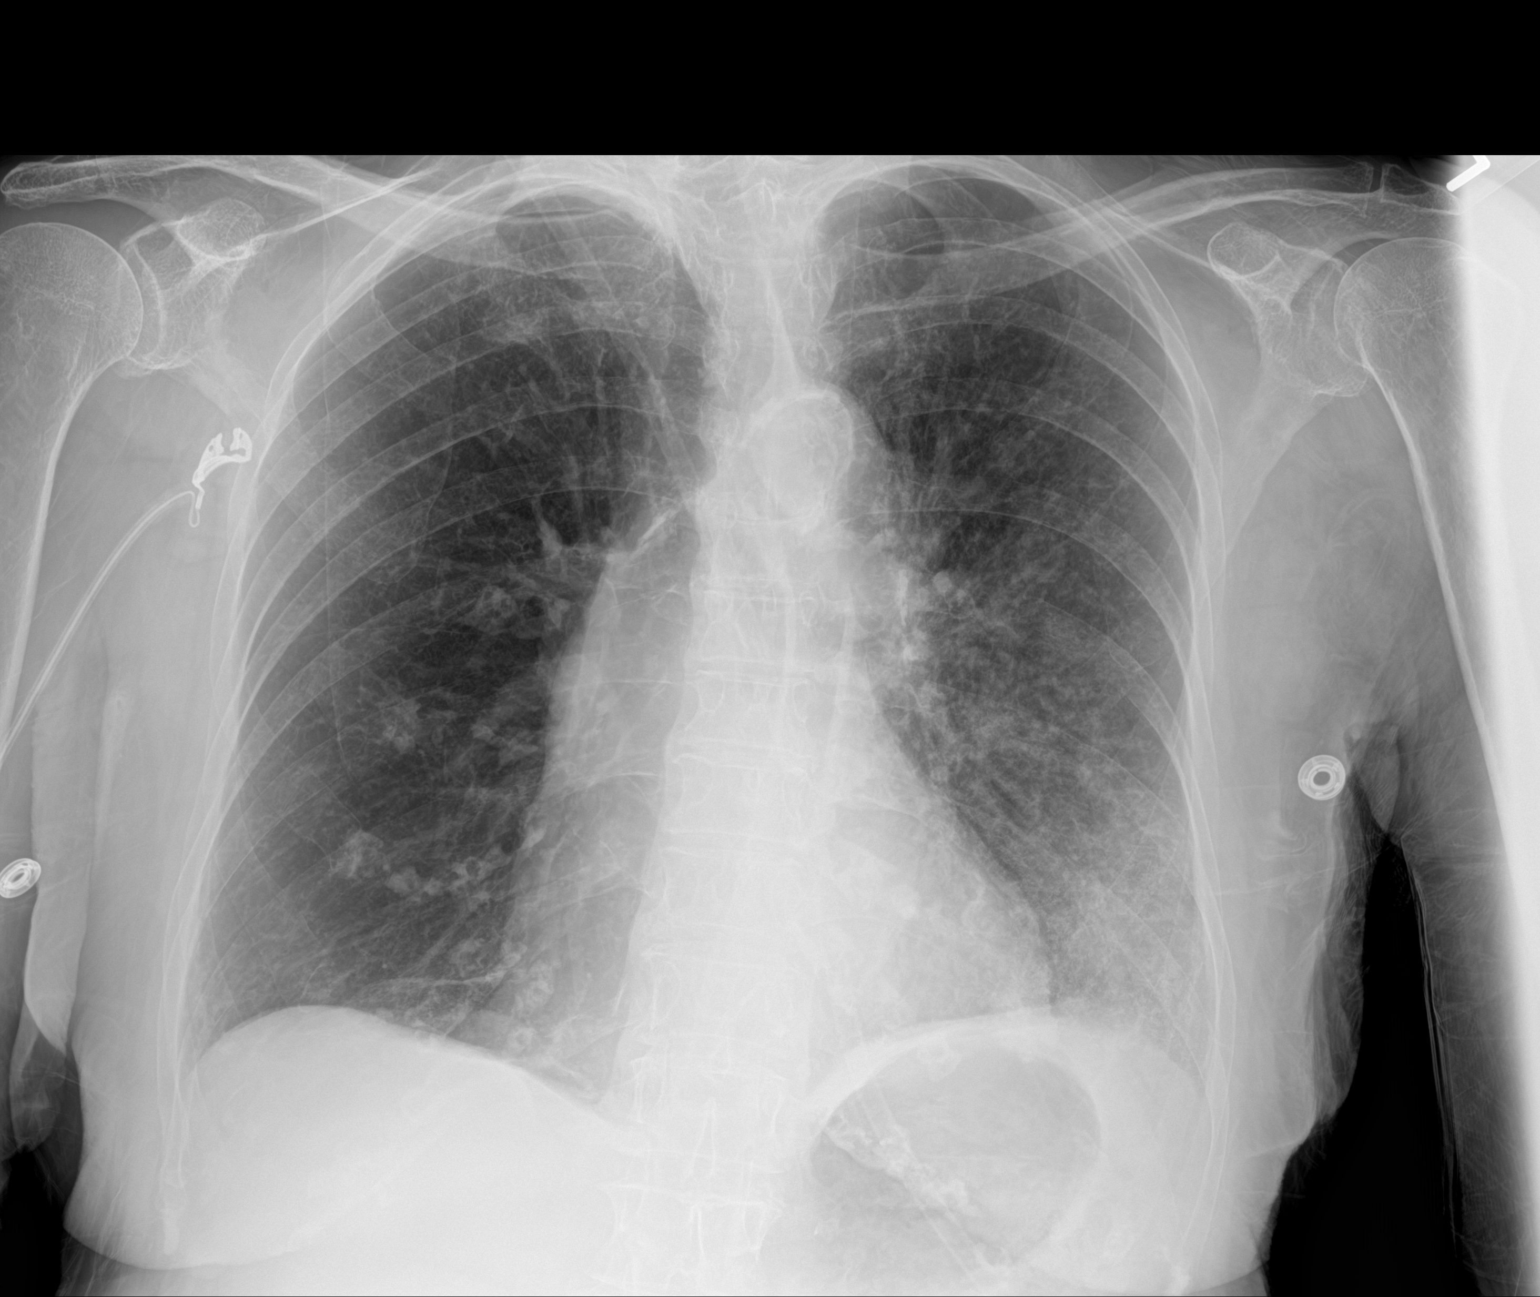

[1 of 1 positions shown; findings below may reference images not displayed]

FINDINGS: Stable cardiomediastinal silhouette. Atherosclerosis of thoracic
aorta is noted. Stable right basilar scarring is noted. Slightly
increased left basilar opacity is noted which may represent
pulmonary hemorrhage secondary to biopsy. Bony thorax is
unremarkable. No pneumothorax or significant effusion is noted.
IMPRESSION: No pneumothorax status post left lung biopsy. Slightly increased
left basilar density is noted which may represent pulmonary
hemorrhage secondary to biopsy.

Aortic Atherosclerosis ([id number]).

## 2021-04-10 IMAGING — DX DG CHEST 1V PORT
1 series · 1 of 1 positions shown · non-contrast
Comparison: Chest x-rays and CT biopsy from same day.

CLINICAL DATA: Hemoptysis after lung biopsy.

EXAM:
PORTABLE CHEST 1 VIEW

[chest]
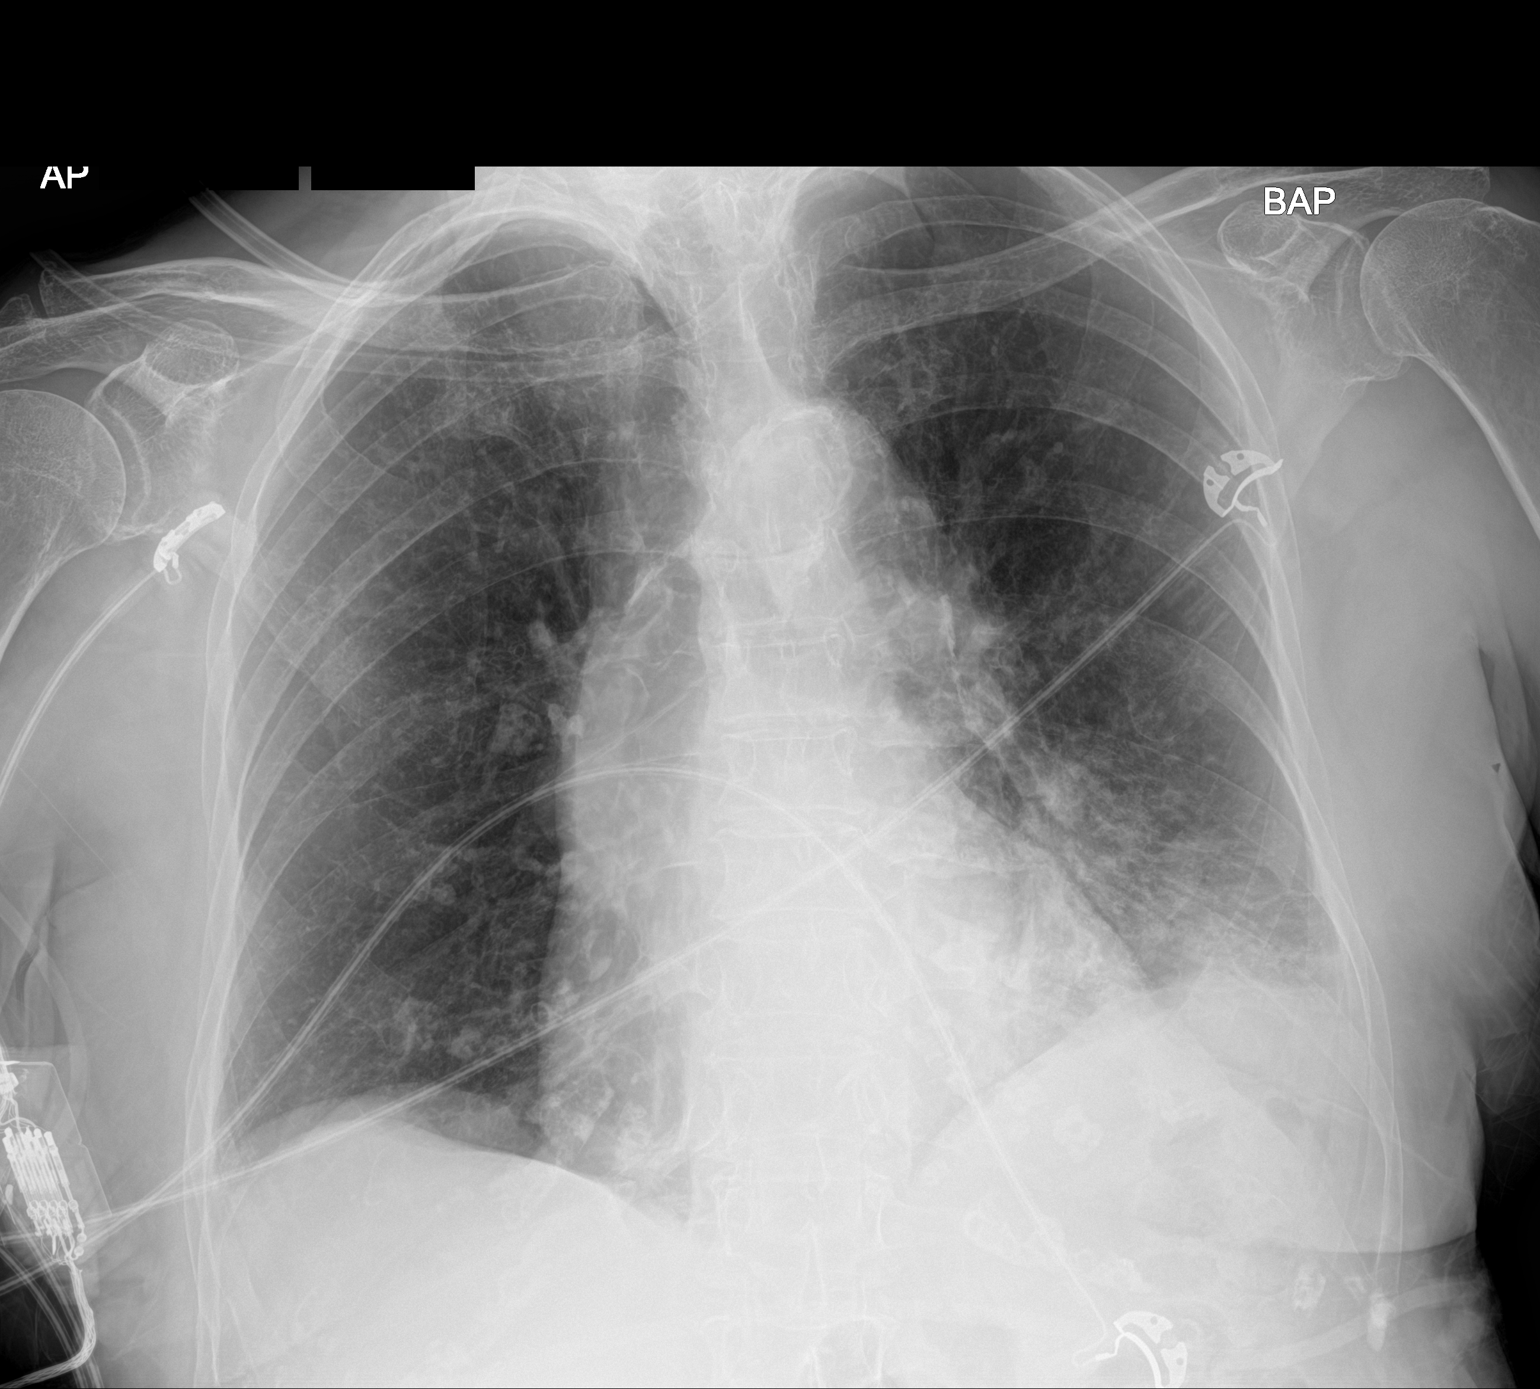

[1 of 1 positions shown; findings below may reference images not displayed]

FINDINGS: The heart size and mediastinal contours are within normal limits.
Atherosclerotic calcification of the aortic arch. Normal pulmonary
vascularity. Unchanged patchy airspace disease at the left lung
base. No pneumothorax or pleural effusion. No acute osseous
abnormality.
IMPRESSION: 1. No pneumothorax. The suspected pleural line on the prior x-ray
represented a skin fold as it extended beyond the margins of the
lung.
2. Unchanged post biopsy hemorrhage in the left lower lobe.

## 2021-04-11 IMAGING — CR DG CHEST 2V
2 series · 2 of 2 positions shown · non-contrast
Comparison: None.

CLINICAL DATA: Hemoptysis

EXAM:
CHEST - 2 VIEW

[chest pa]
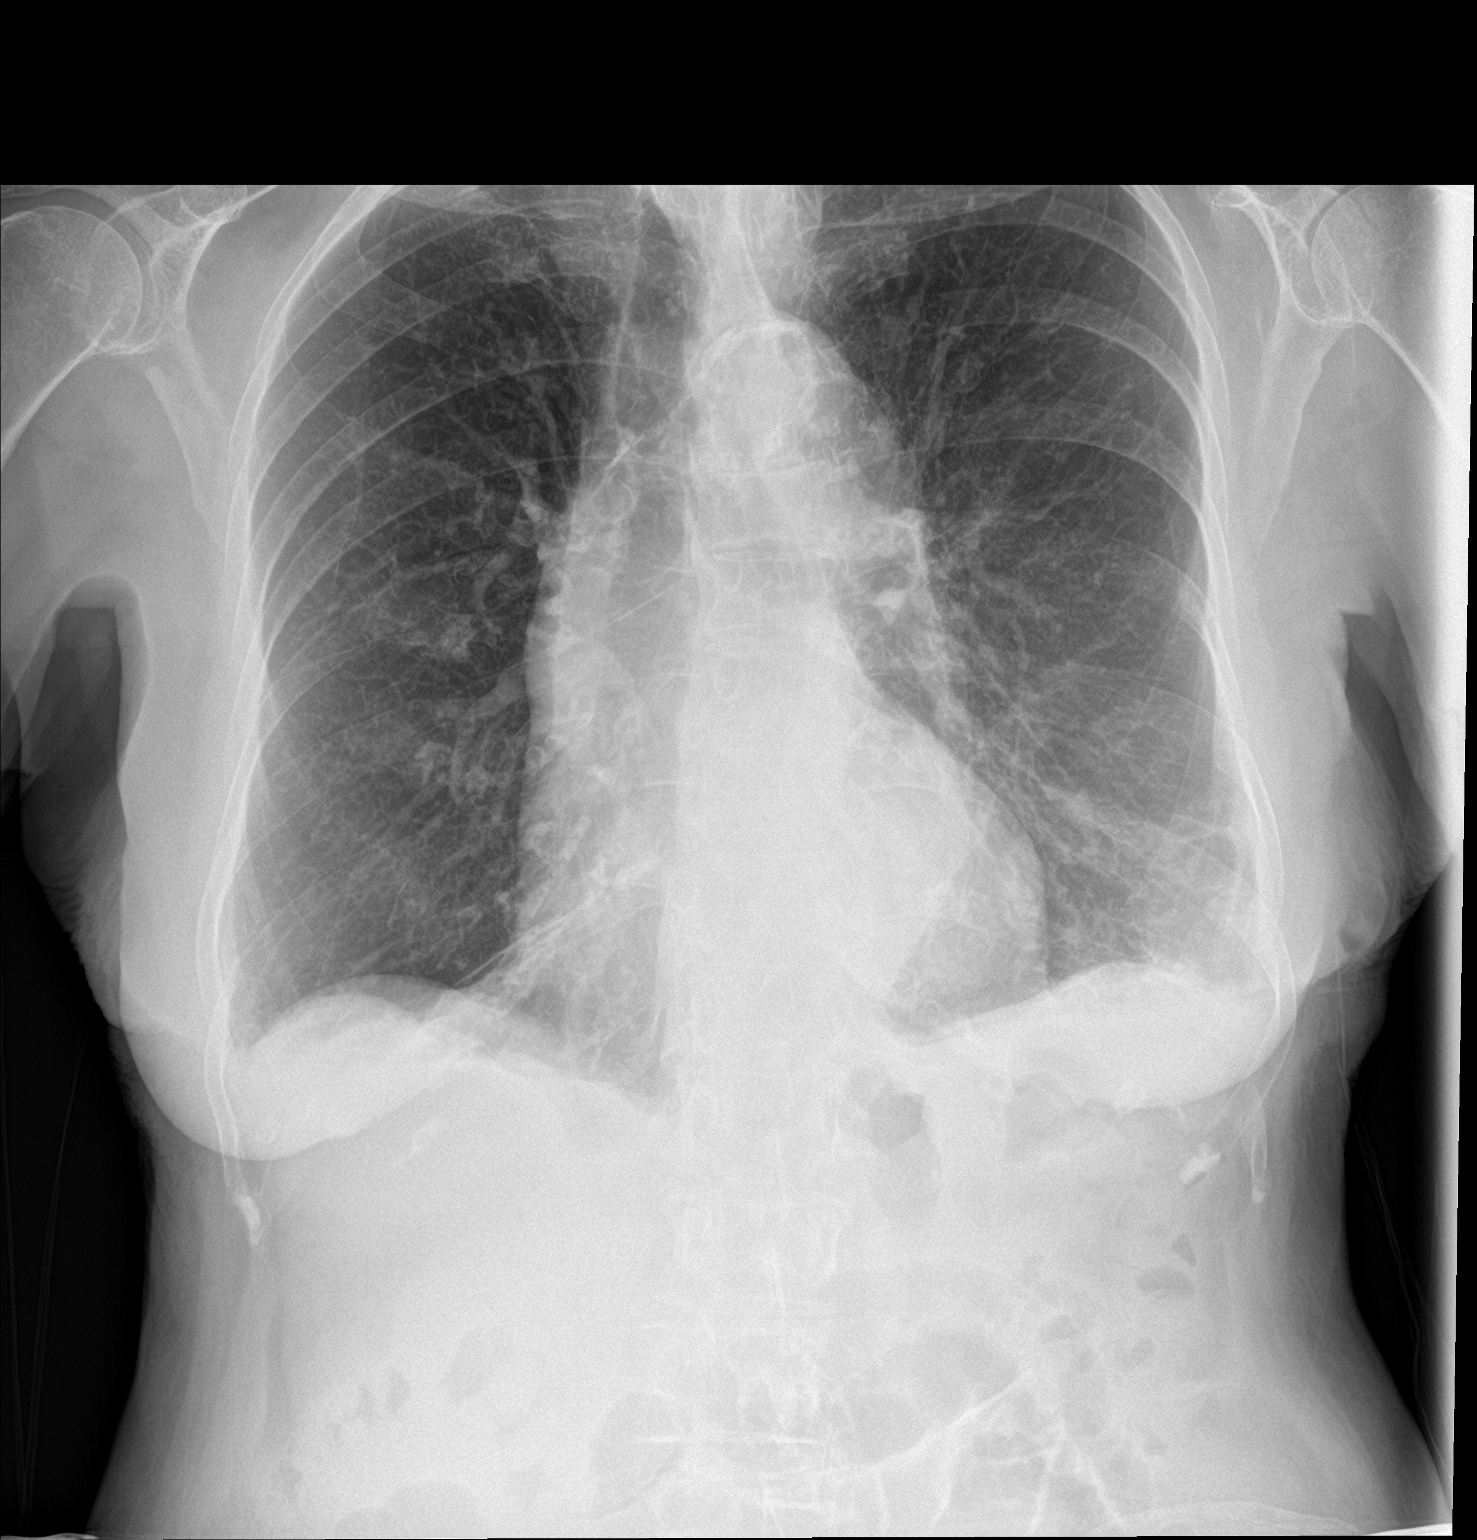

[chest lat]
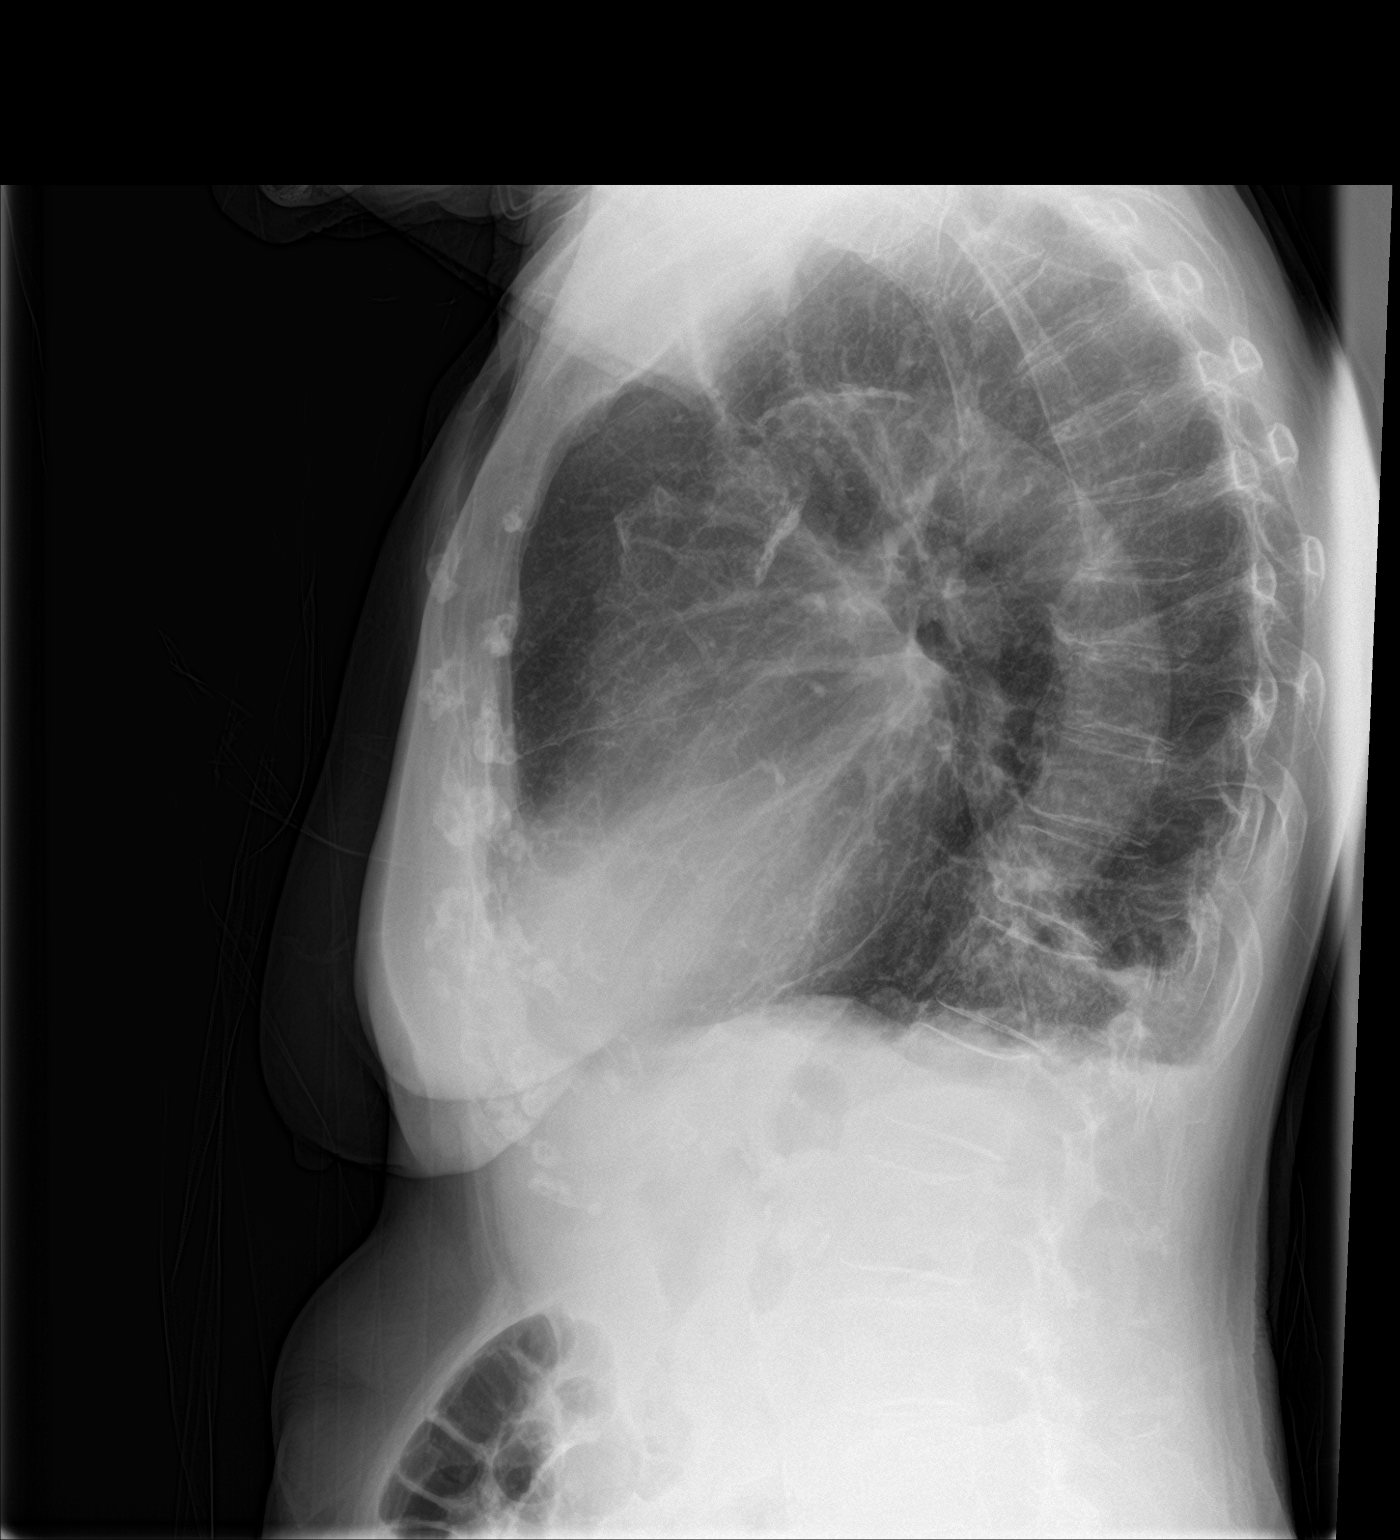

[2 of 2 positions shown; findings below may reference images not displayed]

FINDINGS: The heart size is normal. Vascular calcifications are seen in the
aortic arch. An airspace opacity in the left lung base posteriorly
likely represents the patient's previously biopsied pulmonary nodule
with surrounding post biopsy hemorrhage. The right lung is clear.
There is a tiny left pleural effusion. There is no pneumothorax. The
visualized skeletal structures are unremarkable.
IMPRESSION: 1. Tiny left pleural effusion. No pneumothorax.
2. Left basilar airspace opacity, likely the patient's previously
biopsied pulmonary nodule with surrounding post biopsy hemorrhage.

Aortic Atherosclerosis ([id number]).

## 2021-06-12 ENCOUNTER — Telehealth: Payer: Self-pay

## 2021-06-12 ENCOUNTER — Other Ambulatory Visit: Payer: Self-pay

## 2021-06-12 NOTE — Telephone Encounter (Signed)
Encounter opened in error

## 2021-06-12 NOTE — Telephone Encounter (Signed)
Pt called stating she would like to move forward with getting her chest CT done sooner than May 2023.  I have changed the expected date on the CT order and advised the pt to contact Radiology Scheduling to schedule this exam. Pt expressed understanding of this information.

## 2021-06-16 ENCOUNTER — Ambulatory Visit (HOSPITAL_COMMUNITY)
Admission: RE | Admit: 2021-06-16 | Discharge: 2021-06-16 | Disposition: A | Discharge status: Home / Self Care | Payer: Medicare HMO | Source: Ambulatory Visit | Attending: Internal Medicine | Admitting: Internal Medicine

## 2021-06-16 ENCOUNTER — Other Ambulatory Visit: Payer: Self-pay

## 2021-06-16 DIAGNOSIS — C3490 Malignant neoplasm of unspecified part of unspecified bronchus or lung: Secondary | ICD-10-CM | POA: Diagnosis present

## 2021-06-16 LAB — POCT I-STAT CREATININE: Creatinine, Ser: 0.8 mg/dL (ref 0.44–1.00)

## 2021-06-16 MED ORDER — SODIUM CHLORIDE (PF) 0.9 % IJ SOLN
INTRAMUSCULAR | Status: AC
  Filled 2021-06-16: qty 50

## 2021-06-16 MED ORDER — IOHEXOL 300 MG/ML  SOLN
75.0000 mL | Freq: Once | INTRAMUSCULAR | Status: AC | PRN
  Administered 2021-06-16: 75 mL via INTRAVENOUS

## 2021-06-20 ENCOUNTER — Telehealth: Payer: Self-pay

## 2021-06-20 NOTE — Telephone Encounter (Signed)
Pt called wanting to know her CT results. Pt was advised to please follow-up with her PCP regarding her results. She expressed understanding of the information.  IMPRESSION: 1. Multiple new left-sided rib fractures which appear acute/subacute.  Given that pts child also call after the pt attempts to relay what she has been advised, I have tried to call her son and daughter twice and was unable to leave a message because both of their mailboxes are full.  I have faxed the CT report to pt PCP, Dr. Melford Aase.

## 2021-06-25 ENCOUNTER — Telehealth: Payer: Self-pay

## 2021-06-25 NOTE — Telephone Encounter (Signed)
Pt called again wanting to know her CT results. Pt was again advised to please follow-up with her PCP regarding her results. She expressed understanding of the information.  The following was again reviewed with the pt IMPRESSION: 1. Multiple new left-sided rib fractures which appear acute/subacute.   Given that pt has some trouble remembering conversations, I have again attempted to reach her son and daughter listed. I have again tried each of them twice each to no avail as I was not ale to leave either of them a message because both of their mailboxes are full.

## 2021-09-23 ENCOUNTER — Other Ambulatory Visit (HOSPITAL_COMMUNITY): Payer: Self-pay | Admitting: Physician Assistant

## 2021-09-23 ENCOUNTER — Other Ambulatory Visit: Payer: Self-pay | Admitting: Physician Assistant

## 2021-09-23 DIAGNOSIS — R1310 Dysphagia, unspecified: Secondary | ICD-10-CM

## 2021-09-29 ENCOUNTER — Other Ambulatory Visit: Payer: Self-pay

## 2021-09-29 DIAGNOSIS — C3432 Malignant neoplasm of lower lobe, left bronchus or lung: Secondary | ICD-10-CM

## 2021-09-30 ENCOUNTER — Inpatient Hospital Stay: Payer: Medicare HMO | Attending: Family Medicine

## 2021-09-30 ENCOUNTER — Ambulatory Visit (HOSPITAL_COMMUNITY): Payer: Medicare HMO

## 2021-10-02 ENCOUNTER — Inpatient Hospital Stay: Payer: Medicare HMO | Admitting: Internal Medicine

## 2021-10-03 ENCOUNTER — Ambulatory Visit (HOSPITAL_COMMUNITY)
Admission: RE | Admit: 2021-10-03 | Discharge: 2021-10-03 | Disposition: A | Discharge status: Home / Self Care | Payer: Medicare HMO | Source: Ambulatory Visit | Attending: Physician Assistant | Admitting: Physician Assistant

## 2021-10-03 DIAGNOSIS — R1310 Dysphagia, unspecified: Secondary | ICD-10-CM | POA: Diagnosis not present

## 2021-10-20 ENCOUNTER — Other Ambulatory Visit: Payer: Medicare HMO

## 2021-10-21 ENCOUNTER — Ambulatory Visit: Payer: Medicare HMO | Admitting: Internal Medicine

## 2022-03-19 ENCOUNTER — Inpatient Hospital Stay (HOSPITAL_COMMUNITY)
Admission: EM | Admit: 2022-03-19 | Discharge: 2022-03-23 | DRG: 193 | Disposition: A | Discharge status: Home Health | Payer: Medicare HMO | Attending: Family Medicine | Admitting: Family Medicine

## 2022-03-19 ENCOUNTER — Ambulatory Visit: Payer: Self-pay

## 2022-03-19 ENCOUNTER — Ambulatory Visit
Admission: EM | Admit: 2022-03-19 | Discharge: 2022-03-19 | Disposition: A | Discharge status: Other Acute Inpatient Hospital | Payer: Medicare HMO

## 2022-03-19 ENCOUNTER — Other Ambulatory Visit: Payer: Self-pay

## 2022-03-19 ENCOUNTER — Emergency Department (HOSPITAL_COMMUNITY): Payer: Medicare HMO

## 2022-03-19 DIAGNOSIS — R1310 Dysphagia, unspecified: Secondary | ICD-10-CM | POA: Diagnosis present

## 2022-03-19 DIAGNOSIS — J9601 Acute respiratory failure with hypoxia: Secondary | ICD-10-CM

## 2022-03-19 DIAGNOSIS — I10 Essential (primary) hypertension: Secondary | ICD-10-CM | POA: Diagnosis present

## 2022-03-19 DIAGNOSIS — Z881 Allergy status to other antibiotic agents status: Secondary | ICD-10-CM

## 2022-03-19 DIAGNOSIS — M1990 Unspecified osteoarthritis, unspecified site: Secondary | ICD-10-CM | POA: Diagnosis present

## 2022-03-19 DIAGNOSIS — C3432 Malignant neoplasm of lower lobe, left bronchus or lung: Secondary | ICD-10-CM | POA: Diagnosis not present

## 2022-03-19 DIAGNOSIS — J189 Pneumonia, unspecified organism: Principal | ICD-10-CM | POA: Diagnosis present

## 2022-03-19 DIAGNOSIS — F32A Depression, unspecified: Secondary | ICD-10-CM | POA: Diagnosis present

## 2022-03-19 DIAGNOSIS — I129 Hypertensive chronic kidney disease with stage 1 through stage 4 chronic kidney disease, or unspecified chronic kidney disease: Secondary | ICD-10-CM | POA: Diagnosis present

## 2022-03-19 DIAGNOSIS — E559 Vitamin D deficiency, unspecified: Secondary | ICD-10-CM | POA: Diagnosis present

## 2022-03-19 DIAGNOSIS — R079 Chest pain, unspecified: Secondary | ICD-10-CM | POA: Diagnosis not present

## 2022-03-19 DIAGNOSIS — R0602 Shortness of breath: Secondary | ICD-10-CM | POA: Diagnosis not present

## 2022-03-19 DIAGNOSIS — Z7951 Long term (current) use of inhaled steroids: Secondary | ICD-10-CM

## 2022-03-19 DIAGNOSIS — F419 Anxiety disorder, unspecified: Secondary | ICD-10-CM | POA: Diagnosis present

## 2022-03-19 DIAGNOSIS — R5381 Other malaise: Secondary | ICD-10-CM | POA: Diagnosis present

## 2022-03-19 DIAGNOSIS — Z923 Personal history of irradiation: Secondary | ICD-10-CM

## 2022-03-19 DIAGNOSIS — J4521 Mild intermittent asthma with (acute) exacerbation: Secondary | ICD-10-CM | POA: Diagnosis present

## 2022-03-19 DIAGNOSIS — N1832 Chronic kidney disease, stage 3b: Secondary | ICD-10-CM

## 2022-03-19 DIAGNOSIS — Z85118 Personal history of other malignant neoplasm of bronchus and lung: Secondary | ICD-10-CM

## 2022-03-19 DIAGNOSIS — J45901 Unspecified asthma with (acute) exacerbation: Secondary | ICD-10-CM | POA: Diagnosis not present

## 2022-03-19 DIAGNOSIS — E538 Deficiency of other specified B group vitamins: Secondary | ICD-10-CM | POA: Diagnosis present

## 2022-03-19 DIAGNOSIS — Z79899 Other long term (current) drug therapy: Secondary | ICD-10-CM | POA: Diagnosis not present

## 2022-03-19 DIAGNOSIS — Z1152 Encounter for screening for COVID-19: Secondary | ICD-10-CM | POA: Diagnosis not present

## 2022-03-19 DIAGNOSIS — Z7982 Long term (current) use of aspirin: Secondary | ICD-10-CM | POA: Diagnosis not present

## 2022-03-19 LAB — CBC WITH DIFFERENTIAL/PLATELET
Abs Immature Granulocytes: 0.59 10*3/uL — ABNORMAL HIGH (ref 0.00–0.07)
Basophils Absolute: 0.1 10*3/uL (ref 0.0–0.1)
Basophils Relative: 0 %
Eosinophils Absolute: 0 10*3/uL (ref 0.0–0.5)
Eosinophils Relative: 0 %
HCT: 40.1 % (ref 36.0–46.0)
Hemoglobin: 12.8 g/dL (ref 12.0–15.0)
Immature Granulocytes: 2 %
Lymphocytes Relative: 4 %
Lymphs Abs: 1.2 10*3/uL (ref 0.7–4.0)
MCH: 28.8 pg (ref 26.0–34.0)
MCHC: 31.9 g/dL (ref 30.0–36.0)
MCV: 90.1 fL (ref 80.0–100.0)
Monocytes Absolute: 1.8 10*3/uL — ABNORMAL HIGH (ref 0.1–1.0)
Monocytes Relative: 6 %
Neutro Abs: 25.7 10*3/uL — ABNORMAL HIGH (ref 1.7–7.7)
Neutrophils Relative %: 88 %
Platelets: 366 10*3/uL (ref 150–400)
RBC: 4.45 MIL/uL (ref 3.87–5.11)
RDW: 14.9 % (ref 11.5–15.5)
WBC: 29.5 10*3/uL — ABNORMAL HIGH (ref 4.0–10.5)
nRBC: 0 % (ref 0.0–0.2)

## 2022-03-19 LAB — BASIC METABOLIC PANEL
Anion gap: 9 (ref 5–15)
BUN: 23 mg/dL (ref 8–23)
CO2: 25 mmol/L (ref 22–32)
Calcium: 9.3 mg/dL (ref 8.9–10.3)
Chloride: 102 mmol/L (ref 98–111)
Creatinine, Ser: 1.08 mg/dL — ABNORMAL HIGH (ref 0.44–1.00)
GFR, Estimated: 49 mL/min — ABNORMAL LOW (ref >60)
Glucose, Bld: 121 mg/dL — ABNORMAL HIGH (ref 70–99)
Potassium: 4.2 mmol/L (ref 3.5–5.1)
Sodium: 136 mmol/L (ref 135–145)

## 2022-03-19 LAB — TROPONIN I (HIGH SENSITIVITY)
Troponin I (High Sensitivity): 9 ng/L (ref <18)
Troponin I (High Sensitivity): 8 ng/L (ref <18)

## 2022-03-19 LAB — SARS CORONAVIRUS 2 BY RT PCR: SARS Coronavirus 2 by RT PCR: NEGATIVE

## 2022-03-19 LAB — BRAIN NATRIURETIC PEPTIDE: B Natriuretic Peptide: 175.7 pg/mL — ABNORMAL HIGH (ref 0.0–100.0)

## 2022-03-19 LAB — D-DIMER, QUANTITATIVE: D-Dimer, Quant: 0.63 ug/mL-FEU — ABNORMAL HIGH (ref 0.00–0.50)

## 2022-03-19 MED ORDER — MELATONIN 5 MG PO TABS: 5.0000 mg | ORAL_TABLET | Freq: Every evening | ORAL | Status: DC | PRN

## 2022-03-19 MED ORDER — DM-GUAIFENESIN ER 30-600 MG PO TB12
1.0000 | ORAL_TABLET | Freq: Two times a day (BID) | ORAL | Status: DC
  Filled 2022-03-19: qty 1

## 2022-03-19 MED ORDER — IPRATROPIUM-ALBUTEROL 0.5-2.5 (3) MG/3ML IN SOLN
3.0000 mL | Freq: Four times a day (QID) | RESPIRATORY_TRACT | Status: DC
  Filled 2022-03-19: qty 3

## 2022-03-19 MED ORDER — ACETAMINOPHEN 325 MG PO TABS
650.0000 mg | ORAL_TABLET | Freq: Four times a day (QID) | ORAL | Status: DC | PRN
  Administered 2022-03-22: 650 mg via ORAL
  Filled 2022-03-19 (×2): qty 2

## 2022-03-19 MED ORDER — PROCHLORPERAZINE EDISYLATE 10 MG/2ML IJ SOLN: 5.0000 mg | Freq: Four times a day (QID) | INTRAMUSCULAR | Status: DC | PRN

## 2022-03-19 MED ORDER — SODIUM CHLORIDE 0.9 % IV SOLN
2.0000 g | INTRAVENOUS | Status: DC
  Administered 2022-03-19 – 2022-03-22 (×4): 2 g via INTRAVENOUS
  Filled 2022-03-19 (×4): qty 20

## 2022-03-19 MED ORDER — SODIUM CHLORIDE 0.9 % IV SOLN
500.0000 mg | Freq: Once | INTRAVENOUS | Status: DC
  Filled 2022-03-19: qty 5

## 2022-03-19 MED ORDER — ENOXAPARIN SODIUM 30 MG/0.3ML IJ SOSY
30.0000 mg | PREFILLED_SYRINGE | INTRAMUSCULAR | Status: DC
  Administered 2022-03-20 – 2022-03-23 (×4): 30 mg via SUBCUTANEOUS
  Filled 2022-03-19 (×4): qty 0.3

## 2022-03-19 MED ORDER — SODIUM CHLORIDE 0.9 % IV SOLN: INTRAVENOUS | Status: DC

## 2022-03-19 MED ORDER — SODIUM CHLORIDE 0.9 % IV SOLN
1.0000 g | Freq: Once | INTRAVENOUS | Status: DC
  Filled 2022-03-19: qty 10

## 2022-03-19 MED ORDER — SODIUM CHLORIDE 0.9 % IV SOLN
500.0000 mg | INTRAVENOUS | Status: DC
  Administered 2022-03-20 – 2022-03-22 (×3): 500 mg via INTRAVENOUS
  Filled 2022-03-19 (×2): qty 5

## 2022-03-19 MED ORDER — LOSARTAN POTASSIUM 25 MG PO TABS
100.0000 mg | ORAL_TABLET | Freq: Once | ORAL | Status: AC
  Administered 2022-03-19: 100 mg via ORAL
  Filled 2022-03-19: qty 4

## 2022-03-19 NOTE — ED Provider Notes (Signed)
[facility]  Note:  This document was prepared using Systems analyst and may include unintentional dictation errors.  MRN: [id number] DOB: 10-28-1929  Subjective:   Sandra Cooper is a 88 y.o. female presenting for 1 day history of acute onset shortness of breath, difficulty breathing.  Reports that this was preceded by left-sided chest pain behind her ribs and breast.  It has improved but still feels pain when she presses on her chest.  Patient last received radiation therapy ~2 years ago and has been in remission.  She did not have any surgical procedures.  She also has a history of asthma, takes Symbicort and albuterol.  Has been using this consistently.  Patient does not use oxygen therapy at home.  She has not a smoker.  Has previously had exposure to secondhand smoke.  No history of pulmonary embolism.  She gets monitored yearly for lung cancer through chest CT scans.  Last one did not show any change in the lung nodule.  No current facility-administered medications for this encounter.  Current Outpatient Medications:    Budesonide-Formoterol Fumarate (SYMBICORT IN), Inhale into the lungs., Disp: , Rfl:    acetaminophen (TYLENOL) 325 MG tablet, Take 2 tablets (650 mg total) by mouth every 6 (six) hours as needed for mild pain (or Fever >/= 101). (Patient not taking: Reported on 03/19/2022), Disp:  , Rfl:    albuterol (VENTOLIN HFA) 108 (90 Base) MCG/ACT inhaler, Inhale 2 puffs into the lungs every 6 (six) hours as needed for wheezing or shortness of breath., Disp: 6.7 g, Rfl: 2   alum & mag hydroxide-simeth (MAALOX/MYLANTA) 200-200-20 MG/5ML suspension, Take 15 mLs by mouth every 6 (six) hours as needed for indigestion or heartburn. (Patient not taking: No sig reported), Disp: 355 mL, Rfl: 0   amLODipine (NORVASC) 10 MG tablet, Take 10 mg by mouth daily., Disp: , Rfl:    ASPIRIN PO, Take 1 tablet by mouth daily., Disp: , Rfl:    budesonide-formoterol  (SYMBICORT) 160-4.5 MCG/ACT inhaler, Inhale 2 puffs into the lungs 2 (two) times daily. (Patient taking differently: Inhale 1 puff into the lungs in the morning and at bedtime.), Disp: 1 Inhaler, Rfl: 6   Calcium Carb-Cholecalciferol (CALCIUM-VITAMIN D) 600-400 MG-UNIT TABS, Take 1 tablet by mouth daily., Disp: , Rfl:    Camphor-Menthol-Methyl Sal (SALONPAS) 3.06-06-08 % PTCH, Place 1 patch onto the skin daily as needed (to painful site)., Disp: , Rfl:    Cholecalciferol (VITAMIN D) 50 MCG (2000 UT) CAPS, Take 2,000 Units by mouth daily., Disp: , Rfl:    DHA-Vitamin C-Lutein (EYE HEALTH FORMULA PO), Take 1 capsule by mouth daily., Disp: , Rfl:    losartan (COZAAR) 100 MG tablet, Take 100 mg by mouth every evening., Disp: , Rfl:    magnesium gluconate (MAGONATE) 500 MG tablet, Take 500-1,000 mg by mouth daily., Disp: , Rfl:    mirtazapine (REMERON) 15 MG tablet, Take 15 mg by mouth at bedtime. (Patient not taking: Reported on 03/19/2022), Disp: , Rfl:    Multiple Vitamin (MULTIVITAMIN) tablet, Take 1 tablet by mouth daily., Disp: , Rfl:    Omega-3-6-9 CAPS, Take 1 capsule by mouth 2 (two) times daily.  (Patient not taking: Reported on 03/19/2022), Disp: , Rfl:    OVER THE COUNTER MEDICATION, Take 1 tablet by mouth See admin instructions. Life Extension, Brain Support Supplement tablets- Take 1 tablet by mouth once a day, Disp: , Rfl:    Potassium 99 MG TABS, Take 99  mg by mouth daily., Disp: , Rfl:    Spacer/Aero-Holding Chambers (AEROCHAMBER MV) inhaler, Use as instructed, Disp: 1 each, Rfl: 0   vitamin B-12 (CYANOCOBALAMIN) 500 MCG tablet, Take 500 mcg by mouth daily., Disp: , Rfl:    Allergies  Allergen Reactions   Fluoxetine Swelling and Other (See Comments)    Facial swelling and shakiness    Past Medical History:  Diagnosis Date   Arthritis    Asthma    Hypertension    Lung cancer ([place])    adenocarcinoma LLL     Past Surgical History:  Procedure Laterality Date   APPENDECTOMY      CT guided lung biopsy     HERNIA REPAIR     I & D EXTREMITY Left 08/26/2020   Procedure: IRRIGATION AND DEBRIDEMENT EXTREMITY;  Surgeon: Dayna Barker, MD;  Location: [facility];  Service: Plastics;  Laterality: Left;   ORIF WRIST FRACTURE Left 08/26/2020   Procedure: OPEN REDUCTION INTERNAL FIXATION (ORIF) WRIST FRACTURE;  Surgeon: Dayna Barker, MD;  Location: [facility];  Service: Plastics;  Laterality: Left;    Family History  Problem Relation Age of Onset   Cancer Neg Hx     Social History   Tobacco Use   Smoking status: Never   Smokeless tobacco: Never   Tobacco comments:    Exposed to second hand smoke  Vaping Use   Vaping Use: Never used  Substance Use Topics   Alcohol use: Never   Drug use: Never    ROS   Objective:   Vitals: BP 105/67 (BP Location: Right Arm)   Pulse 91   Temp 98.2 F (36.8 C) (Oral)   Resp 18   SpO2 92%   Pulse oximetry at 88% on room air at triage. She was placed on 4 liters of oxygen and improved her pulse oximetry at 94%.   Physical Exam Constitutional:      General: She is not in acute distress.    Appearance: Normal appearance. She is well-developed. She is not ill-appearing, toxic-appearing or diaphoretic.  HENT:     Head: Normocephalic and atraumatic.     Nose: Nose normal.     Mouth/Throat:     Mouth: Mucous membranes are moist.  Eyes:     General: No scleral icterus.       Right eye: No discharge.        Left eye: No discharge.     Extraocular Movements: Extraocular movements intact.  Cardiovascular:     Rate and Rhythm: Normal rate and regular rhythm.     Heart sounds: Normal heart sounds. No murmur heard.    No friction rub. No gallop.  Pulmonary:     Effort: Pulmonary effort is normal. No respiratory distress.     Breath sounds: No stridor. Examination of the right-middle field reveals decreased breath sounds. Examination of the left-middle field reveals rhonchi. Examination of the right-lower field reveals decreased breath  sounds. Examination of the left-lower field reveals rhonchi. Decreased breath sounds and rhonchi present. No wheezing or rales.  Chest:     Chest wall: No tenderness.  Skin:    General: Skin is warm and dry.  Neurological:     General: No focal deficit present.     Mental Status: She is alert and oriented to person, place, and time.  Psychiatric:        Mood and Affect: Mood normal.        Behavior: Behavior normal.    ED ECG REPORT  Date: 03/19/2022  EKG Time: 6:10 PM  Rate: 85bpm  Rhythm: normal sinus rhythm,  unchanged from previous tracings  Axis: normal  Intervals:none  ST&T Change: t-wave flattening in III, aVL  Narrative Interpretation: sinus rhythm at 85bpm with non-specific t-wave changes as above. No changes from prior ecg in 2020.   Assessment and Plan :   PDMP not reviewed this encounter.  1. Acute respiratory failure with hypoxia ([place])   2. History of lung cancer   3. Shortness of breath   4. Left-sided chest pain     Patient has signs of respiratory failure with hypoxia.  In clinic she is not in respiratory distress but only improved to 93-94% pulse oximetry with 4 L of oxygen.  Differential is extensive but of the most pressing would be respiratory failure, pulmonary embolism, pneumonia, viral pneumonia, acute cardiopulmonary event.  As such given her risk factors she requires a higher level of care than we can provide in the urgent setting.  I discussed this with the patient and her family member.  They declined transport to the hospital by ambulance, recommended presenting to the emergency room by personal vehicle immediately.   Jaynee Eagles, [place] 03/19/22 1610

## 2022-03-19 NOTE — H&P (Addendum)
History and Physical  Sandra Cooper UVO:[id number] DOB: [birth year] DOA: 03/19/2022  Referring physician: Montine Circle, PA-EDP  PCP: [name], MD  Outpatient Specialists: Medical oncology. Patient coming from: Home.  Chief Complaint: Shortness of breath and dry cough.  HPI: Sandra Cooper is a 88 y.o. female with medical history significant for history of asthma, left lower lung cancer status post radiation therapy in 2020, in remission, chronic anxiety/depression, essential hypertension, vitamin D deficiency, vitamin B12 deficiency, who presented to [facility] ED from home with complaints of shortness of breath and a non productive cough of 2 days duration.  No subjective fevers or chills.  She initially went to urgent care where she was noted to be hypoxic with O2 saturation in the 80s and required 4 L nasal cannula to maintain O2 saturation above 92%.  She was advised to go to the ED for further evaluation and management.    At baseline, the patient stays active.  Takes care of all her ADLs, drives her own car, cooks for herself, walks on the treadmill in her home 3 x per week and exercises on her stationary bike also weekly.  States for the past 24 hours, she gets short of breath with minimal movement.  Conversational dyspnea noted on exam and mild diffuse wheezes with concern for acute asthma exacerbation.  The patient also reports history of dysphagia to solids and liquids.  States she has to be very careful when she drinks or eat.  Feels like bolus of food gets stuck in her esophageus and wants to come out.  Presented to the ED by personal vehicle as they declined transport to the hospital by ambulance.  Work-up in the ED revealed acute asthma exacerbation with acute hypoxic respiratory failure, right lower lobe community-acquired pneumonia.  The patient was started on empiric IV antibiotics.  TR[facility], hospitalist service, was asked to admit.  ED Course: Tmax 98.9.  BP 125/70, pulse 96, respiratory  25, saturation 95% on 2 L.  Lab studies remarkable for serum glucose 121, creatinine 1.08 with GFR 49.  BNP 175.  Troponin 9, 8.  WBC 29.5.  Neutrophil count 25.7.  Review of Systems: Review of systems as noted in the HPI. All other systems reviewed and are negative.   Past Medical History:  Diagnosis Date   Arthritis    Asthma    Hypertension    Lung cancer ([place])    adenocarcinoma LLL   Past Surgical History:  Procedure Laterality Date   APPENDECTOMY     CT guided lung biopsy     HERNIA REPAIR     I & D EXTREMITY Left 08/26/2020   Procedure: IRRIGATION AND DEBRIDEMENT EXTREMITY;  Surgeon: Dayna Barker, MD;  Location: [facility];  Service: Plastics;  Laterality: Left;   ORIF WRIST FRACTURE Left 08/26/2020   Procedure: OPEN REDUCTION INTERNAL FIXATION (ORIF) WRIST FRACTURE;  Surgeon: Dayna Barker, MD;  Location: [facility];  Service: Plastics;  Laterality: Left;    Social History:  reports that she has never smoked. She has never used smokeless tobacco. She reports that she does not drink alcohol and does not use drugs.   Allergies  Allergen Reactions   Fluoxetine Swelling and Other (See Comments)    Facial swelling and shakiness    Family History  Problem Relation Age of Onset   Cancer Neg Hx       Prior to Admission medications   Medication Sig Start Date End Date Taking? Authorizing Provider  albuterol (VENTOLIN HFA) 108 (90  Base) MCG/ACT inhaler Inhale 2 puffs into the lungs every 6 (six) hours as needed for wheezing or shortness of breath. 03/15/19  Yes Buriev, Arie Sabina, MD  amLODipine (NORVASC) 10 MG tablet Take 10 mg by mouth daily.   Yes [provider]  ASPIRIN PO Take 1 tablet by mouth daily.   Yes [provider]  budesonide-formoterol (SYMBICORT) 160-4.5 MCG/ACT inhaler Inhale 2 puffs into the lungs 2 (two) times daily. Patient taking differently: Inhale 1 puff into the lungs in the morning and at bedtime. 04/13/19  Yes Chesley Mires, MD  Calcium  Carb-Cholecalciferol (CALCIUM-VITAMIN D) 600-400 MG-UNIT TABS Take 1 tablet by mouth daily.   Yes [provider]  Camphor-Menthol-Methyl Sal (SALONPAS) 3.06-06-08 % PTCH Place 1 patch onto the skin daily as needed (to painful site).   Yes [provider]  Cholecalciferol (VITAMIN D) 50 MCG (2000 UT) CAPS Take 2,000 Units by mouth daily.   Yes [provider]  DHA-Vitamin C-Lutein (EYE HEALTH FORMULA PO) Take 1 capsule by mouth daily.   Yes [provider]  losartan (COZAAR) 100 MG tablet Take 100 mg by mouth every evening. 01/21/19  Yes [provider]  magnesium gluconate (MAGONATE) 500 MG tablet Take 500-1,000 mg by mouth daily.   Yes [provider]  mirtazapine (REMERON) 15 MG tablet Take 15 mg by mouth at bedtime. 11/29/18  Yes [provider]  Multiple Vitamin (MULTIVITAMIN) tablet Take 1 tablet by mouth daily.   Yes [provider]  OVER THE COUNTER MEDICATION Take 1 tablet by mouth See admin instructions. Life Extension, Brain Support Supplement tablets- Take 1 tablet by mouth once a day   Yes [provider]  Potassium 99 MG TABS Take 99 mg by mouth daily.   Yes [provider]  vitamin B-12 (CYANOCOBALAMIN) 500 MCG tablet Take 500 mcg by mouth daily.   Yes [provider]  acetaminophen (TYLENOL) 325 MG tablet Take 2 tablets (650 mg total) by mouth every 6 (six) hours as needed for mild pain (or Fever >/= 101). Patient not taking: Reported on 03/19/2022 03/15/19   Kinnie Feil, MD  alum & mag hydroxide-simeth (MAALOX/MYLANTA) 200-200-20 MG/5ML suspension Take 15 mLs by mouth every 6 (six) hours as needed for indigestion or heartburn. Patient not taking: Reported on 08/26/2020 03/15/19   Kinnie Feil, MD  Omega-3-6-9 CAPS Take 1 capsule by mouth 2 (two) times daily.  Patient not taking: Reported on 03/19/2022    [provider]  Spacer/Aero-Holding Chambers (AEROCHAMBER MV)  inhaler Use as instructed 04/05/19   Chesley Mires, MD    Physical Exam: BP 111/70   Pulse 91   Temp 98.9 F (37.2 C)   Resp 14   Ht 4\' 11"  (1.499 m)   Wt 53 kg   SpO2 97%   BMI 23.60 kg/m   General: 88 y.o. year-old female well developed well nourished in no acute distress.  Alert and oriented x3. Cardiovascular: Regular rate and rhythm with no rubs or gallops.  No thyromegaly or JVD noted.  No lower extremity edema. 2/4 pulses in all 4 extremities. Respiratory: Conversational dyspnea noted on exam.  Diffuse mild wheezing bilaterally.  Poor inspiratory effort. Abdomen: Soft nontender nondistended with normal bowel sounds x4 quadrants. Muskuloskeletal: No cyanosis, clubbing or edema noted bilaterally Neuro: CN II-XII intact, strength, sensation, reflexes Skin: No ulcerative lesions noted or rashes Psychiatry: Judgement and insight appear normal. Mood is appropriate for condition and setting  Labs on Admission:  Basic Metabolic Panel: Recent Labs  Lab 03/19/22 2000  NA 136  K 4.2  CL 102  CO2 25  GLUCOSE 121*  BUN 23  CREATININE 1.08*  CALCIUM 9.3   Liver Function Tests: No results for input(s): "AST", "ALT", "ALKPHOS", "BILITOT", "PROT", "ALBUMIN" in the last 168 hours. No results for input(s): "LIPASE", "AMYLASE" in the last 168 hours. No results for input(s): "AMMONIA" in the last 168 hours. CBC: Recent Labs  Lab 03/19/22 2000  WBC 29.5*  NEUTROABS 25.7*  HGB 12.8  HCT 40.1  MCV 90.1  PLT 366   Cardiac Enzymes: No results for input(s): "CKTOTAL", "CKMB", "CKMBINDEX", "TROPONINI" in the last 168 hours.  BNP (last 3 results) Recent Labs    03/19/22 2000  BNP 175.7*    ProBNP (last 3 results) No results for input(s): "PROBNP" in the last 8760 hours.  CBG: No results for input(s): "GLUCAP" in the last 168 hours.  Radiological Exams on Admission: DG Chest 2 View  Result Date: 03/19/2022 CLINICAL DATA:  Shortness of breath, chills, body  aches and fatigue. Non-small cell lung cancer diagnosed October 2020 with radiation therapy at that time, left lower lobe lateral base. EXAM: CHEST - 2 VIEW COMPARISON:  Chest CTs with contrast 06/16/2021 and 09/27/2020 FINDINGS: There is a patchy hazy infiltrate in the right lower lung field, with scattered linear atelectatic foci, most likely a combination of pneumonia and atelectasis. On the left, in the lateral lower lobe base there is opacity abutting the lateral hemidiaphragm consistent with the known treated lesion, with adjacent streaky scar-like linear opacities tracking medially from the lesion to the hilum. The appearance is similar to the prior chest CTs with no new abnormality on the left. A pleurodiaphragmatic adhesion in the lateral left base producing sulcal blunting as before. The remaining lungs are clear. The cardiac size is normal. The aorta is tortuous and calcified with stable mediastinum. Severe chronic compression fracture again noted T3 vertebral body, mild chronic wedging at T6 and T11. There is osteopenia and chronic thoracic kyphosis. IMPRESSION: Patchy infiltrate in the right lower lung field most likely due to pneumonia, with scattered linear atelectatic foci. This is best seen on the PA, not well localized on the lateral. Stable chronic appearance of treated neoplasm in the left lower lobe with scar-like opacities extending to the hilum from the lesion. Osteopenia, chronic compression fractures and kyphosis. Electronically Signed   By: Telford Nab M.D.   On: 03/19/2022 20:17    EKG: I independently viewed the EKG done and my findings are as followed: Sinus rhythm rate of 89.  Nonspecific ST T changes.  QTc 466.  Assessment/Plan Present on Admission:  CAP (community acquired pneumonia)  Principal Problem:   CAP (community acquired pneumonia)  Right lower lobe community-acquired pneumonia, POA Started on Rocephin and azithromycin in the ED, continue. Obtain baseline  procalcitonin. DuoNebs every 6 hours Antitussives as needed. Monitor fever curve and WBC. Obtain sputum culture if able.  Acute asthma exacerbation secondary to above Add IV Solu-Medrol 40 mg daily x3 days for mild diffuse wheezing Bronchodilators Incentive spirometer Mobilize as tolerated Continue IV antibiotics empirically  Acute hypoxic respiratory failure secondary to above. Not on oxygen supplementation at baseline Currently requiring 2 L to maintain O2 saturation greater than 92%. Continue to treat underlying condition. Wean off oxygen supplementation as tolerated. Incentive spirometer.  Dysphagia to solids and liquids Speech therapist consulted to assist with swallow evaluation. Aspiration precautions.  History of left lower  lung cancer status post radiation in 2020. Follows with medical oncology outpatient.  CKD 3B Appears to be at her baseline creatinine 1.08 with GFR 49. Avoid nephrotoxic agents, dehydration and hypotension.   Monitor urine output with strict I's and O's.  Physical debility PT OT assessment Fall precautions.    DVT prophylaxis: Subcu Lovenox daily  Code Status: Full code  Family Communication: None at bedside  Disposition Plan: Admitted to progressive care unit  Consults called: None.  Admission status: Inpatient status.   Status is: Inpatient The patient requires at least 2 midnights for further evaluation and treatment of present condition.   Kayleen Memos MD [facility] Pager [phone]  If 7PM-7AM, please contact night-coverage [email or web address] Password [email or web address]  03/19/2022, 10:56 PM

## 2022-03-19 NOTE — ED Triage Notes (Signed)
Pt presents with c/o SOB that began today.

## 2022-03-19 NOTE — ED Notes (Signed)
Pt o2 89-90% on room air. 2L ncasal cannula applied

## 2022-03-19 NOTE — ED Provider Notes (Signed)
[facility] Emergency Department Provider Note MRN:  [id number]  Arrival date & time: 03/19/22     Chief Complaint   Shortness of Breath   History of Present Illness   Sandra Cooper is a 88 y.o. year-old female presents to the ED with chief complaint of SOB for the past 2 days.  She states the symptoms started with body aches, myalgias and chills.  She reports associated new non-productive cough. Hx of asthma. No documented fever.  Patient reports worsening SOB with exertion.  Denies heart hx.  She denies any chest pain.  Remote hx of lung cancer, but has been in remission for the past 3 years.  History provided by patient.   Review of Systems  Pertinent positive and negative review of systems noted in HPI.    Physical Exam   Vitals:   03/19/22 2245 03/19/22 2253  BP: 111/70   Pulse: 90 91  Resp: 18 14  Temp: 98.9 F (37.2 C)   SpO2: 95% 97%    CONSTITUTIONAL:  well-appearing, NAD NEURO:  Alert and oriented x 3, CN 3-12 grossly intact EYES:  eyes equal and reactive ENT/NECK:  Supple, no stridor  CARDIO:  normal rate, regular rhythm, appears well-perfused  PULM:  No respiratory distress, a few rhonchi in right lower lobe,  GI/GU:  non-distended, non tenders MSK/SPINE:  No gross deformities, no pitting edema, moves all extremities  SKIN:  no rash, atraumatic   *Additional and/or pertinent findings included in MDM below  Diagnostic and Interventional Summary    EKG Interpretation  Date/Time:    Ventricular Rate:    PR Interval:    QRS Duration:   QT Interval:    QTC Calculation:   R Axis:     Text Interpretation:         Labs Reviewed  BASIC METABOLIC PANEL - Abnormal; Notable for the following components:      Result Value   Glucose, Bld 121 (*)    Creatinine, Ser 1.08 (*)    GFR, Estimated 49 (*)    All other components within normal limits  BRAIN NATRIURETIC PEPTIDE - Abnormal; Notable for the following components:   B  Natriuretic Peptide 175.7 (*)    All other components within normal limits  D-DIMER, QUANTITATIVE - Abnormal; Notable for the following components:   D-Dimer, Quant 0.63 (*)    All other components within normal limits  CBC WITH DIFFERENTIAL/PLATELET - Abnormal; Notable for the following components:   WBC 29.5 (*)    Neutro Abs 25.7 (*)    Monocytes Absolute 1.8 (*)    Abs Immature Granulocytes 0.59 (*)    All other components within normal limits  SARS CORONAVIRUS 2 BY RT PCR  TROPONIN I (HIGH SENSITIVITY)  TROPONIN I (HIGH SENSITIVITY)    DG Chest 2 View  Final Result      Medications  losartan (COZAAR) tablet 100 mg (has no administration in time range)  cefTRIAXone (ROCEPHIN) 1 g in sodium chloride 0.9 % 100 mL IVPB (has no administration in time range)  azithromycin (ZITHROMAX) 500 mg in sodium chloride 0.9 % 250 mL IVPB (has no administration in time range)     Procedures  /  Critical Care .Critical Care  Performed by: Montine Circle, PA-C Authorized by: Montine Circle, PA-C   Critical care provider statement:    Critical care time (minutes):  30   Critical care was necessary to treat or prevent imminent or life-threatening deterioration of the following conditions:  Respiratory failure   Critical care was time spent personally by me on the following activities:  Development of treatment plan with patient or surrogate, discussions with consultants, evaluation of patient's response to treatment, examination of patient, ordering and review of laboratory studies, ordering and review of radiographic studies, ordering and performing treatments and interventions, pulse oximetry, re-evaluation of patient's condition and review of old charts   ED Course and Medical Decision Making  I have reviewed the triage vital signs, the nursing notes, and pertinent available records from the EMR.  Social Determinants Affecting Complexity of Care: Patient has no clinically significant  social determinants affecting this chief complaint..   ED Course:    Medical Decision Making Patient here with 2 days of worsening SOB and non-productive cough.  Started out with flu like symptoms.  Was seen at urgent care and sent to ED due to new hypoxia.   Patient appears to have CAP as seen on CXR, but is normotensive and not tachycardic.  No sign of other end organ damage.  Will hold off on weight based fluid resuscitation unless something changes.  Will start azithro and rocephin.  Will need admission due to new hypoxia.  Amount and/or Complexity of Data Reviewed Labs: ordered.    Details: Ordered in triage, reviewed by me.  Leukocytosis to 29.5. Cr 1.08.  BNP 175.7.  D-dimer 0.63, ordered in triage.  Age adjusted is ok, but patient does have hx of lung cancer, however patient symptoms are more consistent with CAP, though recurrent cancer or PE is not ruled out. Radiology: ordered and independent interpretation performed.    Details: Ordered in triage, no free air, RLL opacity  Risk Prescription drug management. Decision regarding hospitalization.     Consultants: I discussed the case with Hospitalist, Dr. Nevada Crane, who is appreciated for admitting.   Treatment and Plan: Patient's exam and diagnostic results are concerning for CAP with hypoxia.  Feel that patient will need admission to the hospital for further treatment and evaluation.  Patient discussed with attending physician, Dr. Darl Householder, who agrees with plan for admission.  Final Clinical Impressions(s) / ED Diagnoses     ICD-10-CM   1. Acute respiratory failure with hypoxia (HCC)  J96.01     2. Community acquired pneumonia of right lower lobe of lung  J18.9       ED Discharge Orders     None         Discharge Instructions Discussed with and Provided to Patient:   Discharge Instructions   None      Montine Circle, PA-C 03/19/22 2257    Drenda Freeze, MD 03/24/22 [phone]

## 2022-03-19 NOTE — ED Provider Triage Note (Signed)
Emergency Medicine Provider Triage Evaluation Note  Sandra Cooper , a 88 y.o. female  was evaluated in triage.  Pt complains of shortness of breath.  Started 2 days ago.  Was feeling unwell, body aches and joint pain at that time.  Shortness of breath started later that day.  Shortness of breath worse with exertion.  Denies chest pain.  Denies calf tenderness thought she might have the flu or COVID.  Endorses a new cough as of yesterday.  Does have a history of asthma.  Takes Symbicort daily and uses a rescue inhaler as needed.  Has a remote history of lung cancer but has been in remission for 3 years.  Per daughter, otherwise very healthy for her age.  Review of Systems  Positive: See above Negative: See above  Physical Exam  BP (!) 135/109 (BP Location: Left Arm)   Pulse 87   Temp 98.9 F (37.2 C) (Oral)   Resp 18   Ht 4\' 11"  (1.499 m)   Wt 53 kg   SpO2 94%   BMI 23.60 kg/m  Gen:   Awake, no distress   Resp:  Tachypneic, hypoxic, symptoms improved after placed on nasal cannula.  No wheezing or crackles MSK:   Moves extremities without difficulty  Other:    Medical Decision Making  Medically screening exam initiated at 7:30 PM.  Appropriate orders placed.  Sandra Cooper was informed that the remainder of the evaluation will be completed by another provider, this initial triage assessment does not replace that evaluation, and the importance of remaining in the ED until their evaluation is complete.  Work up initiated   Harriet Pho, PA-C 03/19/22 1932

## 2022-03-19 NOTE — ED Triage Notes (Signed)
Pt reports shob, chills, bodyaches, fatigue x2 days.  Ibuprofen with relief.  Pt reports being seen at UC with 88% on RA.

## 2022-03-19 NOTE — ED Notes (Signed)
Pt placed on 4 L O2 via [place] per MM PA.

## 2022-03-19 NOTE — ED Notes (Signed)
Patient is being discharged from the Urgent Care and sent to the Emergency Department via POV . Per Lina Sar, patient is in need of higher level of care due to SOB and CP. Patient is aware and verbalizes understanding of plan of care.  Vitals:   03/19/22 1744 03/19/22 1748  BP: 105/67   Pulse: 91   Resp: 18   Temp: 98.2 F (36.8 C)   SpO2: (!) 88% 92%

## 2022-03-19 NOTE — ED Notes (Signed)
Pt is refusing EKG at this time.

## 2022-03-19 NOTE — Discharge Instructions (Addendum)
You are in need of a higher level of care than we can provide in the urgent care clinic. You have severely low oxygen level, in our clinic 88% on room air.  Please had to the emergency room now for further testing including concern ration for chest x-ray, chest CT scan, lab testing.

## 2022-03-20 ENCOUNTER — Telehealth: Payer: Self-pay | Admitting: Medical Oncology

## 2022-03-20 DIAGNOSIS — J189 Pneumonia, unspecified organism: Secondary | ICD-10-CM | POA: Diagnosis present

## 2022-03-20 DIAGNOSIS — J9601 Acute respiratory failure with hypoxia: Secondary | ICD-10-CM

## 2022-03-20 LAB — CBC WITH DIFFERENTIAL/PLATELET
Abs Immature Granulocytes: 0.51 10*3/uL — ABNORMAL HIGH (ref 0.00–0.07)
Basophils Absolute: 0.1 10*3/uL (ref 0.0–0.1)
Basophils Relative: 0 %
Eosinophils Absolute: 0 10*3/uL (ref 0.0–0.5)
Eosinophils Relative: 0 %
HCT: 35.6 % — ABNORMAL LOW (ref 36.0–46.0)
Hemoglobin: 11.5 g/dL — ABNORMAL LOW (ref 12.0–15.0)
Immature Granulocytes: 2 %
Lymphocytes Relative: 2 %
Lymphs Abs: 0.5 10*3/uL — ABNORMAL LOW (ref 0.7–4.0)
MCH: 29 pg (ref 26.0–34.0)
MCHC: 32.3 g/dL (ref 30.0–36.0)
MCV: 89.9 fL (ref 80.0–100.0)
Monocytes Absolute: 1.1 10*3/uL — ABNORMAL HIGH (ref 0.1–1.0)
Monocytes Relative: 4 %
Neutro Abs: 25.3 10*3/uL — ABNORMAL HIGH (ref 1.7–7.7)
Neutrophils Relative %: 92 %
Platelets: 307 10*3/uL (ref 150–400)
RBC: 3.96 MIL/uL (ref 3.87–5.11)
RDW: 14.9 % (ref 11.5–15.5)
WBC: 27.4 10*3/uL — ABNORMAL HIGH (ref 4.0–10.5)
nRBC: 0 % (ref 0.0–0.2)

## 2022-03-20 LAB — BASIC METABOLIC PANEL
Anion gap: 7 (ref 5–15)
BUN: 20 mg/dL (ref 8–23)
CO2: 24 mmol/L (ref 22–32)
Calcium: 8.7 mg/dL — ABNORMAL LOW (ref 8.9–10.3)
Chloride: 107 mmol/L (ref 98–111)
Creatinine, Ser: 0.98 mg/dL (ref 0.44–1.00)
GFR, Estimated: 55 mL/min — ABNORMAL LOW (ref >60)
Glucose, Bld: 132 mg/dL — ABNORMAL HIGH (ref 70–99)
Potassium: 3.6 mmol/L (ref 3.5–5.1)
Sodium: 138 mmol/L (ref 135–145)

## 2022-03-20 LAB — PHOSPHORUS: Phosphorus: 2.6 mg/dL (ref 2.5–4.6)

## 2022-03-20 LAB — MAGNESIUM: Magnesium: 2.6 mg/dL — ABNORMAL HIGH (ref 1.7–2.4)

## 2022-03-20 LAB — PROCALCITONIN: Procalcitonin: 1.95 ng/mL

## 2022-03-20 MED ORDER — IPRATROPIUM-ALBUTEROL 0.5-2.5 (3) MG/3ML IN SOLN
3.0000 mL | Freq: Three times a day (TID) | RESPIRATORY_TRACT | Status: DC
  Administered 2022-03-21 – 2022-03-23 (×7): 3 mL via RESPIRATORY_TRACT
  Filled 2022-03-20 (×6): qty 3

## 2022-03-20 MED ORDER — MIRTAZAPINE 15 MG PO TABS
15.0000 mg | ORAL_TABLET | Freq: Every day | ORAL | Status: DC
  Administered 2022-03-20 – 2022-03-22 (×3): 15 mg via ORAL
  Filled 2022-03-20 (×3): qty 1

## 2022-03-20 MED ORDER — IPRATROPIUM-ALBUTEROL 0.5-2.5 (3) MG/3ML IN SOLN
3.0000 mL | Freq: Four times a day (QID) | RESPIRATORY_TRACT | Status: DC
  Administered 2022-03-20 (×4): 3 mL via RESPIRATORY_TRACT
  Filled 2022-03-20: qty 9
  Filled 2022-03-20: qty 3

## 2022-03-20 MED ORDER — ALBUTEROL SULFATE (2.5 MG/3ML) 0.083% IN NEBU: 3.0000 mL | INHALATION_SOLUTION | Freq: Four times a day (QID) | RESPIRATORY_TRACT | Status: DC | PRN

## 2022-03-20 MED ORDER — GUAIFENESIN 100 MG/5ML PO LIQD
5.0000 mL | ORAL | Status: DC | PRN
  Administered 2022-03-20: 5 mL via ORAL
  Filled 2022-03-20: qty 10

## 2022-03-20 MED ORDER — LOSARTAN POTASSIUM 50 MG PO TABS
100.0000 mg | ORAL_TABLET | Freq: Every evening | ORAL | Status: DC
  Administered 2022-03-21 – 2022-03-22 (×2): 100 mg via ORAL
  Filled 2022-03-20 (×4): qty 2

## 2022-03-20 MED ORDER — MOMETASONE FURO-FORMOTEROL FUM 200-5 MCG/ACT IN AERO
2.0000 | INHALATION_SPRAY | Freq: Two times a day (BID) | RESPIRATORY_TRACT | Status: DC
  Administered 2022-03-20 – 2022-03-23 (×6): 2 via RESPIRATORY_TRACT
  Filled 2022-03-20: qty 8.8

## 2022-03-20 MED ORDER — METHYLPREDNISOLONE SODIUM SUCC 40 MG IJ SOLR: 40.0000 mg | Freq: Two times a day (BID) | INTRAMUSCULAR | Status: DC

## 2022-03-20 MED ORDER — AMLODIPINE BESYLATE 10 MG PO TABS
10.0000 mg | ORAL_TABLET | Freq: Every day | ORAL | Status: DC
  Administered 2022-03-20 – 2022-03-23 (×4): 10 mg via ORAL
  Filled 2022-03-20 (×4): qty 1

## 2022-03-20 MED ORDER — CYANOCOBALAMIN 500 MCG PO TABS
500.0000 ug | ORAL_TABLET | Freq: Every day | ORAL | Status: DC
  Administered 2022-03-20 – 2022-03-23 (×4): 500 ug via ORAL
  Filled 2022-03-20 (×4): qty 1

## 2022-03-20 MED ORDER — METHYLPREDNISOLONE SODIUM SUCC 40 MG IJ SOLR
40.0000 mg | Freq: Every day | INTRAMUSCULAR | Status: AC
  Administered 2022-03-20 – 2022-03-22 (×3): 40 mg via INTRAVENOUS
  Filled 2022-03-20 (×3): qty 1

## 2022-03-20 NOTE — Evaluation (Addendum)
Physical Therapy Evaluation Patient Detail Name: Sandra Cooper MRN: [id number] DOB: July 01, 1929 Today's Date: 03/20/2022  History of Present Illness  88 year old with history of mild intermittent asthma, left lower lobe lung cancer status postradiation therapy in 2020, chronic anxiety depression, hypertension presents to the ER 03/19/22 from home with shortness of breath and nonproductive cough . CT- infiltrates/pneumonia.  Clinical Impression  The patient admitted for above medical problems.  Patient re[orts feeling SOB and  fatigued.  Patient  92% on   3 LPM .  HR 107  during mobility. Patient  mobilized with min guard to chair and back to  stretcher.   Patient resides alone, family close by, patient unsure if available 24/7. Patient was independent and very active and exercised until recent SOB onset.  Pt admitted with above diagnosis.  Pt currently with functional limitations due to the deficits listed below (see PT Problem List). Pt will benefit from skilled PT to increase their independence and safety with mobility to allow discharge to the venue listed below.        Recommendations for follow up therapy are one component of a multi-disciplinary discharge planning process, led by the attending physician.  Recommendations may be updated based on patient status, additional functional criteria and insurance authorization.  Follow Up Recommendations Skilled nursing-short term rehab (<3 hours/day)vs HHPT if family support available 24/7 as pt improves. Can patient physically be transported by private vehicle: Yes    Assistance Recommended at Discharge Intermittent Supervision/Assistance  Patient can return home with the following  A little help with walking and/or transfers;A lot of help with walking and/or transfers;Help with stairs or ramp for entrance;Assistance with cooking/housework    Equipment Recommendations None recommended by PT  Recommendations for Other Services        Functional Status Assessment Patient has had a recent decline in their functional status and demonstrates the ability to make significant improvements in function in a reasonable and predictable amount of time.     Precautions / Restrictions Precautions Precaution Comments: monitor sats      Mobility  Bed Mobility Overal bed mobility: Needs Assistance Bed Mobility: Supine to Sit, Sit to Supine     Supine to sit: Min guard Sit to supine: Min guard   General bed mobility comments: for lines    Transfers Overall transfer level: Needs assistance Equipment used: 1 person hand held assist Transfers: Sit to/from Stand, Bed to chair/wheelchair/BSC Sit to Stand: Min guard   Step pivot transfers: Min guard       General transfer comment: patient able to  stand a npivot step to chair then back to bed,    Ambulation/Gait               General Gait Details: declines due to fatigue and breakfast arrived  Stairs            Wheelchair Mobility    Modified Rankin (Stroke Patients Only)       Balance Overall balance assessment: Mild deficits observed, not formally tested                                           Pertinent Vitals/Pain Pain Assessment Pain Assessment: Faces Faces Pain Scale: Hurts little more Pain Location: right lower quadrant Pain Descriptors / Indicators: Cramping, Discomfort Pain Intervention(s): Monitored during session    Home Living Family/patient expects to be discharged to::  Private residence Living Arrangements: Alone Available Help at Discharge: Family;Available PRN/intermittently Type of Home: House Home Access: Stairs to enter   [employer] of Steps: 3   Home Layout: One level Home Equipment: Cane - single point;Rollator (4 wheels) Additional Comments: uses rolator in house PRN, uses cane out    Prior Function Prior Level of Function : Independent/Modified Independent              Mobility Comments: drives,       Hand Dominance   Dominant Hand: Right    Extremity/Trunk Assessment        Lower Extremity Assessment Lower Extremity Assessment: Generalized weakness    Cervical / Trunk Assessment Cervical / Trunk Assessment: Kyphotic  Communication   Communication: No difficulties  Cognition Arousal/Alertness: Awake/alert Behavior During Therapy: WFL for tasks assessed/performed Overall Cognitive Status: Within Functional Limits for tasks assessed                                          General Comments      Exercises     Assessment/Plan    PT Assessment Patient needs continued PT services  PT Problem List Decreased strength;Decreased activity tolerance;Decreased mobility;Decreased safety awareness;Cardiopulmonary status limiting activity;Decreased range of motion;Decreased knowledge of use of DME       PT Treatment Interventions DME instruction;Stair training;Therapeutic activities;Gait training;Functional mobility training;Therapeutic exercise;Patient/family education    PT Goals (Current goals can be found in the Care Plan section)  Acute Rehab PT Goals Patient Stated Goal: go home PT Goal Formulation: With patient Time For Goal Achievement: 04/03/22 Potential to Achieve Goals: Good    Frequency Min 3X/week     Co-evaluation               AM-PAC PT "6 Clicks" Mobility  Outcome Measure Help needed turning from your back to your side while in a flat bed without using bedrails?: A Little Help needed moving from lying on your back to sitting on the side of a flat bed without using bedrails?: A Little Help needed moving to and from a bed to a chair (including a wheelchair)?: A Little Help needed standing up from a chair using your arms (e.g., wheelchair or bedside chair)?: A Little Help needed to walk in hospital room?: Total Help needed climbing 3-5 steps with a railing? : Total 6 Click Score: 14    End of  Session   Activity Tolerance: Patient limited by fatigue Patient left: in bed Nurse Communication: Mobility status PT Visit Diagnosis: Unsteadiness on feet (R26.81);Muscle weakness (generalized) (M62.81);Difficulty in walking, not elsewhere classified (R26.2)    Time: 2229-7989 PT Time Calculation (min) (ACUTE ONLY): 22 min   Charges:   PT Evaluation $PT Eval Low Complexity: 1 Low          [place] Office [phone] Weekend [phone]   Claretha Cooper 03/20/2022, 12:58 PM

## 2022-03-20 NOTE — Progress Notes (Signed)
PROGRESS NOTE    Sandra Cooper  TGG:[id number] DOB: 1929-09-04 DOA: 03/19/2022 PCP: [name], MD    Brief Narrative:  88 year old with history of mild intermittent asthma, left lower lobe lung cancer status postradiation therapy in 2020, chronic anxiety depression, hypertension presents to the ER from home with shortness of breath and nonproductive cough for about 2 days.  No fever or chills.  Went to urgent care and she was found to be 80% on room air required 4 L oxygen so sent to ER.  Also has history of dysphagia to solids and liquids.  She drove herself to the ER.  In the emergency room hemodynamically stable.  95% on 2 L.  WBC 29.5 neutrophil count 25.7.  X-ray with right lower lobe infiltrate.   Assessment & Plan:   Right lower lobe pneumonia with hypoxia. Agree with admission given severity of symptoms. Antibiotics to treat bacterial pneumonia with Rocephin and azithromycin. Chest physiotherapy, incentive spirometry, deep breathing exercises, sputum induction, mucolytic's and bronchodilators. Sputum cultures, blood cultures, Legionella and streptococcal antigen. Supplemental oxygen to keep saturations more than 90%. Rule out aspiration pneumonia, speech therapy to see. She has a history of left lower lobe tumor, infiltrates on the right side.  If persistent shortness of breath, will need CT scan.  Acute asthma exacerbation secondary to pneumonia: Steroids and nebulizers.  Antibiotics as above.  Chest physiotherapy as above.  CKD stage IIIb: Appears at about baseline.  Physical debility: PT OT.  Mobility.   DVT prophylaxis: enoxaparin (LOVENOX) injection 30 mg Start: 03/20/22 1000   Code Status: Full code Family Communication: None at the bedside Disposition Plan: Status is: Inpatient Remains inpatient appropriate because: IV antibiotics, new oxygen requirement     Consultants:  None none  Procedures:  None  Antimicrobials:  Rocephin azithromycin  10/19---   Subjective: Patient was seen and examined.  Remains in ER waiting for inpatient bed.  She was 87% talking in full sentences on room air.  Has some dry cough.  Feels slightly better than last night.  Afebrile.  Objective: Vitals:   03/20/22 0600 03/20/22 0615 03/20/22 0823 03/20/22 1000  BP: 102/61 98/61 (!) 133/97 108/89  Pulse: 79 78 97 (!) 101  Resp: 19 20 (!) 23 15  Temp:   98.1 F (36.7 C)   TempSrc:   Oral   SpO2: 90% 92% 92% 92%  Weight:      Height:        Intake/Output Summary (Last 24 hours) at 03/20/2022 1159 Last data filed at 03/20/2022 0939 Gross per 24 hour  Intake 350 ml  Output --  Net 350 ml   Filed Weights   03/19/22 1901  Weight: 53 kg    Examination:  General: Age-appropriate, pleasant and conversant.  Not in any distress except occasional dry cough. Cardiovascular: S1-S2 normal.  Regular rate rhythm. Respiratory: Some expiratory fine crackles at both bases.  Otherwise good air entry. Gastrointestinal: Soft.  Nontender.  Bowel sound present. Ext: No swelling or edema.  No cyanosis. Neuro: Alert and oriented x4.  No focal logical deficits. Musculoskeletal: No deformities.     Data Reviewed: I have personally reviewed following labs and imaging studies  CBC: Recent Labs  Lab 03/19/22 2000 03/20/22 0500  WBC 29.5* 27.4*  NEUTROABS 25.7* 25.3*  HGB 12.8 11.5*  HCT 40.1 35.6*  MCV 90.1 89.9  PLT 366 703   Basic Metabolic Panel: Recent Labs  Lab 03/19/22 2000 03/20/22 0500  NA 136 138  K  4.2 3.6  CL 102 107  CO2 25 24  GLUCOSE 121* 132*  BUN 23 20  CREATININE 1.08* 0.98  CALCIUM 9.3 8.7*  MG  --  2.6*  PHOS  --  2.6   GFR: Estimated Creatinine Clearance: 28.4 mL/min (by C-G formula based on SCr of 0.98 mg/dL). Liver Function Tests: No results for input(s): "AST", "ALT", "ALKPHOS", "BILITOT", "PROT", "ALBUMIN" in the last 168 hours. No results for input(s): "LIPASE", "AMYLASE" in the last 168 hours. No results for  input(s): "AMMONIA" in the last 168 hours. Coagulation Profile: No results for input(s): "INR", "PROTIME" in the last 168 hours. Cardiac Enzymes: No results for input(s): "CKTOTAL", "CKMB", "CKMBINDEX", "TROPONINI" in the last 168 hours. BNP (last 3 results) No results for input(s): "PROBNP" in the last 8760 hours. HbA1C: No results for input(s): "HGBA1C" in the last 72 hours. CBG: No results for input(s): "GLUCAP" in the last 168 hours. Lipid Profile: No results for input(s): "CHOL", "HDL", "LDLCALC", "TRIG", "CHOLHDL", "LDLDIRECT" in the last 72 hours. Thyroid Function Tests: No results for input(s): "TSH", "T4TOTAL", "FREET4", "T3FREE", "THYROIDAB" in the last 72 hours. Anemia Panel: No results for input(s): "VITAMINB12", "FOLATE", "FERRITIN", "TIBC", "IRON", "RETICCTPCT" in the last 72 hours. Sepsis Labs: Recent Labs  Lab 03/20/22 0500  PROCALCITON 1.95    Recent Results (from the past 240 hour(s))  SARS Coronavirus 2 by RT PCR (hospital order, performed in [facility] hospital lab) *cepheid single result test* Anterior Nasal Swab     Status: None   Collection Time: 03/19/22  8:16 PM   Specimen: Anterior Nasal Swab  Result Value Ref Range Status   SARS Coronavirus 2 by RT PCR NEGATIVE NEGATIVE Final    Comment: (NOTE) SARS-CoV-2 target nucleic acids are NOT DETECTED.  The SARS-CoV-2 RNA is generally detectable in upper and lower respiratory specimens during the acute phase of infection. The lowest concentration of SARS-CoV-2 viral copies this assay can detect is 250 copies / mL. A negative result does not preclude SARS-CoV-2 infection and should not be used as the sole basis for treatment or other patient management decisions.  A negative result may occur with improper specimen collection / handling, submission of specimen other than nasopharyngeal swab, presence of viral mutation(s) within the areas targeted by this assay, and inadequate number of viral copies (<250  copies / mL). A negative result must be combined with clinical observations, patient history, and epidemiological information.  Fact Sheet for Patients:   https://www.patel.info/  Fact Sheet for Healthcare Providers: https://hall.com/  This test is not yet approved or  cleared by the [place] FDA and has been authorized for detection and/or diagnosis of SARS-CoV-2 by FDA under an Emergency Use Authorization (EUA).  This EUA will remain in effect (meaning this test can be used) for the duration of the COVID-19 declaration under Section 564(b)(1) of the Act, 21 U.S.C. section 360bbb-3(b)(1), unless the authorization is terminated or revoked sooner.  Performed at [facility], [place] [address] [place], [place] [address]          Radiology Studies: DG Chest 2 View  Result Date: 03/19/2022 CLINICAL DATA:  Shortness of breath, chills, body aches and fatigue. Non-small cell lung cancer diagnosed October 2020 with radiation therapy at that time, left lower lobe lateral base. EXAM: CHEST - 2 VIEW COMPARISON:  Chest CTs with contrast 06/16/2021 and 09/27/2020 FINDINGS: There is a patchy hazy infiltrate in the right lower lung field, with scattered linear atelectatic foci, most likely a combination  of pneumonia and atelectasis. On the left, in the lateral lower lobe base there is opacity abutting the lateral hemidiaphragm consistent with the known treated lesion, with adjacent streaky scar-like linear opacities tracking medially from the lesion to the hilum. The appearance is similar to the prior chest CTs with no new abnormality on the left. A pleurodiaphragmatic adhesion in the lateral left base producing sulcal blunting as before. The remaining lungs are clear. The cardiac size is normal. The aorta is tortuous and calcified with stable mediastinum. Severe chronic compression fracture again noted T3 vertebral body, mild chronic  wedging at T6 and T11. There is osteopenia and chronic thoracic kyphosis. IMPRESSION: Patchy infiltrate in the right lower lung field most likely due to pneumonia, with scattered linear atelectatic foci. This is best seen on the PA, not well localized on the lateral. Stable chronic appearance of treated neoplasm in the left lower lobe with scar-like opacities extending to the hilum from the lesion. Osteopenia, chronic compression fractures and kyphosis. Electronically Signed   By: Telford Nab M.D.   On: 03/19/2022 20:17        Scheduled Meds:  enoxaparin (LOVENOX) injection  30 mg Subcutaneous Q24H   ipratropium-albuterol  3 mL Nebulization Q6H   methylPREDNISolone (SOLU-MEDROL) injection  40 mg Intravenous Daily   Continuous Infusions:  azithromycin Stopped (03/20/22 0939)   cefTRIAXone (ROCEPHIN)  IV Stopped (03/20/22 0939)     LOS: 1 day    Time spent: 35 minutes     Barb Merino, MD [facility] Pager [phone]

## 2022-03-20 NOTE — Evaluation (Signed)
Clinical/Bedside Swallow Evaluation Patient Details  Name: Sandra Cooper MRN: [id number] Date of Birth: 04/10/1930  Today's Date: 03/20/2022 Time: SLP Start Time (ACUTE ONLY): 42 SLP Stop Time (ACUTE ONLY): 1401 SLP Time Calculation (min) (ACUTE ONLY): 54 min  Past Medical History:  Past Medical History:  Diagnosis Date   Arthritis    Asthma    Hypertension    Lung cancer ([place])    adenocarcinoma LLL   Past Surgical History:  Past Surgical History:  Procedure Laterality Date   APPENDECTOMY     CT guided lung biopsy     HERNIA REPAIR     I & D EXTREMITY Left 08/26/2020   Procedure: IRRIGATION AND DEBRIDEMENT EXTREMITY;  Surgeon: Dayna Barker, MD;  [facility]: [facility];  Service: Plastics;  Laterality: Left;   ORIF WRIST FRACTURE Left 08/26/2020   Procedure: OPEN REDUCTION INTERNAL FIXATION (ORIF) WRIST FRACTURE;  Surgeon: Dayna Barker, MD;  Location: [facility];  Service: Plastics;  Laterality: Left;   HPI:  51 yto female adm to increased shortness of breath - Pt found to have pna. She has h/o adenocarcinoma. She reported she has dysphagia to her MD, problems with solids and liquids.  States she senses lodging in esoph and feels as if need to regurgitate.  She has undergone prior esophagram - Oct 03, 2021 - Mild intermittent esophageal dysmotility with tertiary contractions. Very small sliding hiatal hernia. Prominent cricopharyngeus muscle impression upon the posterior aspect of the lower hypopharynx/upper cervical esophagus.  Upon SLP view of flouro loop with liquid, pharyngeal phase of swallow was [facility] and pt appeared kyphotic that may contribute to her dysphagia.  Pt advised MD offered consideration for surgical intervention but she declined, stating to this SLP that she is "too old".  Pt denies requiring heimlich manuever nor having recurrent pneumonias to this SLP.  She eats very healthily per review of meal ordered.    Assessment / Plan / Recommendation  Clinical Impression  Pt without  focal CN deficits nor overt indication of aspiration.  She admits to issues with swallowing, requiring her to expecorate pills or food at times if she is not careful.  She compensates by eating slowly and eating small amounts - stating her friends are done eating before she is finished and she frequently takes food home with her.     States she senses lodging in esoph and feels as if need to regurgitate.  She has undergone prior esophagram - Oct 03, 2021 showing  "Mild intermittent esophageal dysmotility with tertiary contractions. Very small sliding hiatal hernia. Prominent cricopharyngeus muscle impression upon the posterior aspect of the lower hypopharynx/upper cervical esophagus".  Upon SLP view of flouro loop of study, pt pharyngeal phase of swallow was [facility] without retention nor aspiration/penetratin.  Pt appeared kyphotic which combined with known dysmotility may contribute to her dysphagia symptoms.  Pt advised the GI MD offered consideration for surgical intervention after her esophagram - but she declined, stating to this SLP that she is "too old".  Pt denies requiring heimlich manuever nor having recurrent pneumonias to this SLP.  She eats very healthily per review of meal ordered.  SLP provided compensation strategies for esophageal dysmotility - in writing with teach back. In addition, advised pt maintain strength of cough and expectoration for airway protection.  Ms Sandra Cooper appears to be managing her dysphagia as best as possible.  All education completed.  Thanks for this consult. SLP Visit Diagnosis: Dysphagia, unspecified (R13.10)    Aspiration Risk  Mild aspiration risk  Diet Recommendation Regular;Thin liquid   Liquid Administration via: Cup;Straw Medication Administration: Whole meds with liquid Supervision: Patient able to self feed Compensations: Slow rate;Small sips/bites Postural Changes: Remain upright for at least 30 minutes after po intake;Seated upright at 90 degrees    Other   Recommendations Oral Care Recommendations: Oral care BID    Recommendations for follow up therapy are one component of a multi-disciplinary discharge planning process, led by the attending physician.  Recommendations may be updated based on patient status, additional functional criteria and insurance authorization.  Follow up Recommendations No SLP follow up      Assistance Recommended at Discharge None  Functional Status Assessment Patient has not had a recent decline in their functional status  Frequency and Duration   N/a          Swallow Study   General Date of Onset: 03/20/22 HPI: 73 yto female adm to increased shortness of breath - Pt found to have pna. She has h/o adenocarcinoma. She reported she has dysphagia to her MD, problems with solids and liquids.  States she senses lodging in esoph and feels as if need to regurgitate.  She has undergone prior esophagram - Oct 03, 2021 - Mild intermittent esophageal dysmotility with tertiary contractions. Very small sliding hiatal hernia. Prominent cricopharyngeus muscle impression upon the posterior aspect of the lower hypopharynx/upper cervical esophagus.  Upon SLP view of flouro loop with liquid, pharyngeal phase of swallow was [facility] and pt appeared kyphotic that may contribute to her dysphagia.  Pt advised MD offered consideration for surgical intervention but she declined, stating to this SLP that she is "too old".  Pt denies requiring heimlich manuever nor having recurrent pneumonias to this SLP.  She eats very healthily per review of meal ordered. Type of Study: Bedside Swallow Evaluation Previous Swallow Assessment: see HPI Diet Prior to this Study: Regular;Thin liquids Temperature Spikes Noted: No Respiratory Status: Nasal cannula History of Recent Intubation: No Behavior/Cognition: Alert;Cooperative;Pleasant mood Oral Cavity Assessment: Within Functional Limits Oral Care Completed by SLP: No Oral Cavity - Dentition: Other (Comment)  (partials) Vision: Functional for self-feeding Self-Feeding Abilities: Able to feed self Patient Positioning: Upright in bed Baseline Vocal Quality: Normal Volitional Cough: Strong Volitional Swallow: Able to elicit    Oral/Motor/Sensory Function Overall Oral Motor/Sensory Function: Within functional limits   Ice Chips Ice chips: Not tested   Thin Liquid Thin Liquid: Within functional limits Presentation: Self Fed;Straw    Nectar Thick Nectar Thick Liquid: Not tested   Honey Thick Honey Thick Liquid: Not tested   Puree Puree: Not tested   Solid     Solid: Within functional limits Presentation: Self Fed Other Comments: tilapia      Macario Golds 03/20/2022,4:00 PM  Kathleen Lime, MS [place] Office [phone] Pager [phone]

## 2022-03-20 NOTE — Telephone Encounter (Signed)
Pt left VM on 03/19/22-.Today the VM was retrieved. It was difficult to understand her but I heard her say she is SOB and has low energy . Pt in ED.

## 2022-03-20 NOTE — ED Notes (Signed)
ED TO INPATIENT HANDOFF REPORT  ED Nurse Name and Phone #:   S Name/Age/Gender Sandra Cooper 88 y.o. female Room/Bed: WA15/WA15  Code Status   Code Status: Full Code  Home/SNF/Other Home Patient oriented to: self, place, time, and situation Is this baseline? Yes   Triage Complete: Triage complete  Chief Complaint CAP (community acquired pneumonia) [J18.9] Pneumonia [J18.9]  Triage Note Pt reports shob, chills, bodyaches, fatigue x2 days.  Ibuprofen with relief.  Pt reports being seen at UC with 88% on RA.    Allergies Allergies  Allergen Reactions   Fluoxetine Swelling and Other (See Comments)    Facial swelling and shakiness    Level of Care/Admitting Diagnosis ED Disposition     ED Disposition  Admit   Condition  --   Comment  Hospital Area: [place] [[id number]]  Level of Care: Med-Surg [16]  May admit patient to [facility] or [facility] if equivalent level of care is available:: Yes  Covid Evaluation: Confirmed COVID Negative  Diagnosis: Pneumonia [[id number]]  Admitting Physician: Barb Merino [[id number]]  Attending Physician: Barb Merino [[id number]]  Certification:: I certify this patient will need inpatient services for at least 2 midnights          B Medical/Surgery History Past Medical History:  Diagnosis Date   Arthritis    Asthma    Hypertension    Lung cancer ([place])    adenocarcinoma LLL   Past Surgical History:  Procedure Laterality Date   APPENDECTOMY     CT guided lung biopsy     HERNIA REPAIR     I & D EXTREMITY Left 08/26/2020   Procedure: IRRIGATION AND DEBRIDEMENT EXTREMITY;  Surgeon: Dayna Barker, MD;  [facility];  Service: Plastics;  Laterality: Left;   ORIF WRIST FRACTURE Left 08/26/2020   Procedure: OPEN REDUCTION INTERNAL FIXATION (ORIF) WRIST FRACTURE;  Surgeon: Dayna Barker, MD;  [facility];  Service: Plastics;  Laterality: Left;     A IV Location/Drains/Wounds Patient  Lines/Drains/Airways Status     Active Line/Drains/Airways     Name Placement date Placement time Site Days   Peripheral IV 03/19/22 20 G Anterior;Left;Proximal Forearm 03/19/22  2001  Forearm  1   Incision (Closed) 03/09/19 Chest Left;Lateral 03/09/19  1215  -- 1107   Incision (Closed) 08/26/20 Arm 08/26/20  1902  -- 571            Intake/Output Last 24 hours  Intake/Output Summary (Last 24 hours) at 03/20/2022 1355 Last data filed at 03/20/2022 0939 Gross per 24 hour  Intake 350 ml  Output --  Net 350 ml    Labs/Imaging Results for orders placed or performed during the hospital encounter of 03/19/22 (from the past 48 hour(s))  Basic metabolic panel     Status: Abnormal   Collection Time: 03/19/22  8:00 PM  Result Value Ref Range   Sodium 136 135 - 145 mmol/L   Potassium 4.2 3.5 - 5.1 mmol/L   Chloride 102 98 - 111 mmol/L   CO2 25 22 - 32 mmol/L   Glucose, Bld 121 (H) 70 - 99 mg/dL    Comment: Glucose reference range applies only to samples taken after fasting for at least 8 hours.   BUN 23 8 - 23 mg/dL   Creatinine, Ser 1.08 (H) 0.44 - 1.00 mg/dL   Calcium 9.3 8.9 - 10.3 mg/dL   GFR, Estimated 49 (L) >60 mL/min    Comment: (NOTE) Calculated using the CKD-EPI Creatinine Equation (  2021)    Anion gap 9 5 - 15    Comment: Performed at [facility], [place] [address], [place], [place] [address]  Brain natriuretic peptide     Status: Abnormal   Collection Time: 03/19/22  8:00 PM  Result Value Ref Range   B Natriuretic Peptide 175.7 (H) 0.0 - 100.0 pg/mL    Comment: Performed at [facility], [place] [address], [place], [place] [address]  D-dimer, quantitative     Status: Abnormal   Collection Time: 03/19/22  8:00 PM  Result Value Ref Range   D-Dimer, Quant 0.63 (H) 0.00 - 0.50 ug/mL-FEU    Comment: (NOTE) At the manufacturer cut-off value of 0.5 g/mL FEU, this assay has a negative predictive value of 95-100%.This assay is intended  for use in conjunction with a clinical pretest probability (PTP) assessment model to exclude pulmonary embolism (PE) and deep venous thrombosis (DVT) in outpatients suspected of PE or DVT. Results should be correlated with clinical presentation. Performed at [facility], [place] [address], [place], [place] [address]   CBC with Differential     Status: Abnormal   Collection Time: 03/19/22  8:00 PM  Result Value Ref Range   WBC 29.5 (H) 4.0 - 10.5 K/uL   RBC 4.45 3.87 - 5.11 MIL/uL   Hemoglobin 12.8 12.0 - 15.0 g/dL   HCT 40.1 36.0 - 46.0 %   MCV 90.1 80.0 - 100.0 fL   MCH 28.8 26.0 - 34.0 pg   MCHC 31.9 30.0 - 36.0 g/dL   RDW 14.9 11.5 - 15.5 %   Platelets 366 150 - 400 K/uL   nRBC 0.0 0.0 - 0.2 %   Neutrophils Relative % 88 %   Neutro Abs 25.7 (H) 1.7 - 7.7 K/uL   Lymphocytes Relative 4 %   Lymphs Abs 1.2 0.7 - 4.0 K/uL   Monocytes Relative 6 %   Monocytes Absolute 1.8 (H) 0.1 - 1.0 K/uL   Eosinophils Relative 0 %   Eosinophils Absolute 0.0 0.0 - 0.5 K/uL   Basophils Relative 0 %   Basophils Absolute 0.1 0.0 - 0.1 K/uL   Immature Granulocytes 2 %   Abs Immature Granulocytes 0.59 (H) 0.00 - 0.07 K/uL    Comment: Performed at [facility], [place] [address] [place], [place] [address]  Troponin I (High Sensitivity)     Status: None   Collection Time: 03/19/22  8:00 PM  Result Value Ref Range   Troponin I (High Sensitivity) 9 <18 ng/L    Comment: (NOTE) Elevated high sensitivity troponin I (hsTnI) values and significant  changes across serial measurements may suggest ACS but many other  chronic and acute conditions are known to elevate hsTnI results.  Refer to the "Links" section for chest pain algorithms and additional  guidance. Performed at [facility], [place] [address], [place], [place] [address]   SARS Coronavirus 2 by RT PCR (hospital order, performed in [facility] hospital lab) *cepheid single result test* Anterior  Nasal Swab     Status: None   Collection Time: 03/19/22  8:16 PM   Specimen: Anterior Nasal Swab  Result Value Ref Range   SARS Coronavirus 2 by RT PCR NEGATIVE NEGATIVE    Comment: (NOTE) SARS-CoV-2 target nucleic acids are NOT DETECTED.  The SARS-CoV-2 RNA is generally detectable in upper and lower respiratory specimens during the acute phase of infection. The lowest concentration of SARS-CoV-2 viral copies this assay can detect  is 250 copies / mL. A negative result does not preclude SARS-CoV-2 infection and should not be used as the sole basis for treatment or other patient management decisions.  A negative result may occur with improper specimen collection / handling, submission of specimen other than nasopharyngeal swab, presence of viral mutation(s) within the areas targeted by this assay, and inadequate number of viral copies (<250 copies / mL). A negative result must be combined with clinical observations, patient history, and epidemiological information.  Fact Sheet for Patients:   https://www.patel.info/  Fact Sheet for Healthcare Providers: https://hall.com/  This test is not yet approved or  cleared by the [place] FDA and has been authorized for detection and/or diagnosis of SARS-CoV-2 by FDA under an Emergency Use Authorization (EUA).  This EUA will remain in effect (meaning this test can be used) for the duration of the COVID-19 declaration under Section 564(b)(1) of the Act, 21 U.S.C. section 360bbb-3(b)(1), unless the authorization is terminated or revoked sooner.  Performed at [facility], [place] [address], [place], [place] [address]   Troponin I (High Sensitivity)     Status: None   Collection Time: 03/19/22  9:33 PM  Result Value Ref Range   Troponin I (High Sensitivity) 8 <18 ng/L    Comment: (NOTE) Elevated high sensitivity troponin I (hsTnI) values and significant  changes across serial  measurements may suggest ACS but many other  chronic and acute conditions are known to elevate hsTnI results.  Refer to the "Links" section for chest pain algorithms and additional  guidance. Performed at [facility], [place] [address], [place], [place] [address]   Procalcitonin - Baseline     Status: None   Collection Time: 03/20/22  5:00 AM  Result Value Ref Range   Procalcitonin 1.95 ng/mL    Comment:        Interpretation: PCT > 0.5 ng/mL and <= 2 ng/mL: Systemic infection (sepsis) is possible, but other conditions are known to elevate PCT as well. (NOTE)       Sepsis PCT Algorithm           Lower Respiratory Tract                                      Infection PCT Algorithm    ----------------------------     ----------------------------         PCT < 0.25 ng/mL                PCT < 0.10 ng/mL          Strongly encourage             Strongly discourage   discontinuation of antibiotics    initiation of antibiotics    ----------------------------     -----------------------------       PCT 0.25 - 0.50 ng/mL            PCT 0.10 - 0.25 ng/mL               OR       >80% decrease in PCT            Discourage initiation of  antibiotics      Encourage discontinuation           of antibiotics    ----------------------------     -----------------------------         PCT >= 0.50 ng/mL              PCT 0.26 - 0.50 ng/mL                AND       <80% decrease in PCT             Encourage initiation of                                             antibiotics       Encourage continuation           of antibiotics    ----------------------------     -----------------------------        PCT >= 0.50 ng/mL                  PCT > 0.50 ng/mL               AND         increase in PCT                  Strongly encourage                                      initiation of antibiotics    Strongly encourage escalation           of  antibiotics                                     -----------------------------                                           PCT <= 0.25 ng/mL                                                 OR                                        > 80% decrease in PCT                                      Discontinue / Do not initiate                                             antibiotics  Performed at [place] [address], [place], [place] [address]   CBC with Differential/Platelet     Status: Abnormal  Collection Time: 03/20/22  5:00 AM  Result Value Ref Range   WBC 27.4 (H) 4.0 - 10.5 K/uL   RBC 3.96 3.87 - 5.11 MIL/uL   Hemoglobin 11.5 (L) 12.0 - 15.0 g/dL   HCT 35.6 (L) 36.0 - 46.0 %   MCV 89.9 80.0 - 100.0 fL   MCH 29.0 26.0 - 34.0 pg   MCHC 32.3 30.0 - 36.0 g/dL   RDW 14.9 11.5 - 15.5 %   Platelets 307 150 - 400 K/uL   nRBC 0.0 0.0 - 0.2 %   Neutrophils Relative % 92 %   Neutro Abs 25.3 (H) 1.7 - 7.7 K/uL   Lymphocytes Relative 2 %   Lymphs Abs 0.5 (L) 0.7 - 4.0 K/uL   Monocytes Relative 4 %   Monocytes Absolute 1.1 (H) 0.1 - 1.0 K/uL   Eosinophils Relative 0 %   Eosinophils Absolute 0.0 0.0 - 0.5 K/uL   Basophils Relative 0 %   Basophils Absolute 0.1 0.0 - 0.1 K/uL   WBC Morphology MILD LEFT SHIFT (1-5% METAS, OCC MYELO, OCC BANDS)     Comment: VACUOLATED NEUTROPHILS   Immature Granulocytes 2 %   Abs Immature Granulocytes 0.51 (H) 0.00 - 0.07 K/uL    Comment: Performed at [facility], [place] [address], [place], [place] [address]  Basic metabolic panel     Status: Abnormal   Collection Time: 03/20/22  5:00 AM  Result Value Ref Range   Sodium 138 135 - 145 mmol/L   Potassium 3.6 3.5 - 5.1 mmol/L   Chloride 107 98 - 111 mmol/L   CO2 24 22 - 32 mmol/L   Glucose, Bld 132 (H) 70 - 99 mg/dL    Comment: Glucose reference range applies only to samples taken after fasting for at least 8 hours.   BUN 20 8 - 23 mg/dL   Creatinine, Ser 0.98 0.44  - 1.00 mg/dL   Calcium 8.7 (L) 8.9 - 10.3 mg/dL   GFR, Estimated 55 (L) >60 mL/min    Comment: (NOTE) Calculated using the CKD-EPI Creatinine Equation (2021)    Anion gap 7 5 - 15    Comment: Performed at [facility], [place] [address] [place], [place] [address]  Magnesium     Status: Abnormal   Collection Time: 03/20/22  5:00 AM  Result Value Ref Range   Magnesium 2.6 (H) 1.7 - 2.4 mg/dL    Comment: Performed at [facility], [place] [address], [place], [place] [address]  Phosphorus     Status: None   Collection Time: 03/20/22  5:00 AM  Result Value Ref Range   Phosphorus 2.6 2.5 - 4.6 mg/dL    Comment: Performed at [facility], [place] [address] [place], [place] [address]   DG Chest 2 View  Result Date: 03/19/2022 CLINICAL DATA:  Shortness of breath, chills, body aches and fatigue. Non-small cell lung cancer diagnosed October 2020 with radiation therapy at that time, left lower lobe lateral base. EXAM: CHEST - 2 VIEW COMPARISON:  Chest CTs with contrast 06/16/2021 and 09/27/2020 FINDINGS: There is a patchy hazy infiltrate in the right lower lung field, with scattered linear atelectatic foci, most likely a combination of pneumonia and atelectasis. On the left, in the lateral lower lobe base there is opacity abutting the lateral hemidiaphragm consistent with the known treated lesion, with adjacent streaky scar-like linear opacities tracking medially from the lesion to the hilum. The appearance is similar to the prior chest  CTs with no new abnormality on the left. A pleurodiaphragmatic adhesion in the lateral left base producing sulcal blunting as before. The remaining lungs are clear. The cardiac size is normal. The aorta is tortuous and calcified with stable mediastinum. Severe chronic compression fracture again noted T3 vertebral body, mild chronic wedging at T6 and T11. There is osteopenia and chronic thoracic kyphosis. IMPRESSION: Patchy  infiltrate in the right lower lung field most likely due to pneumonia, with scattered linear atelectatic foci. This is best seen on the PA, not well localized on the lateral. Stable chronic appearance of treated neoplasm in the left lower lobe with scar-like opacities extending to the hilum from the lesion. Osteopenia, chronic compression fractures and kyphosis. Electronically Signed   By: Telford Nab M.D.   On: 03/19/2022 20:17    Pending Labs Unresulted Labs (From admission, onward)     Start     Ordered   03/26/22 0500  Creatinine, serum  (enoxaparin (LOVENOX)    CrCl >/= 30 ml/min)  Weekly,   R     Comments: while on enoxaparin therapy    03/19/22 2302   03/21/22 0500  CBC with Differential/Platelet  Tomorrow morning,   R        03/20/22 0753   03/21/22 0737  Basic metabolic panel  Tomorrow morning,   R        03/20/22 0753   03/20/22 1200  Legionella Pneumophila Serogp 1 Ur Ag  Once,   R        03/20/22 1159   03/20/22 1200  Strep pneumoniae urinary antigen  Once,   R        03/20/22 1159   03/20/22 0150  Expectorated Sputum Assessment w Gram Stain, Rflx to Resp Cult  Once,   R       Question:  Patient immune status  Answer:  Immunocompromised   03/20/22 0150            Vitals/Pain Today's Vitals   03/20/22 0615 03/20/22 0823 03/20/22 1000 03/20/22 1232  BP: 98/61 (!) 133/97 108/89 97/68  Pulse: 78 97 (!) 101 91  Resp: 20 (!) 23 15 16   Temp:  98.1 F (36.7 C)  97.7 F (36.5 C)  TempSrc:  Oral  Oral  SpO2: 92% 92% 92% 92%  Weight:      Height:      PainSc:        Isolation Precautions No active isolations  Medications Medications  enoxaparin (LOVENOX) injection 30 mg (30 mg Subcutaneous Given 03/20/22 0855)  cefTRIAXone (ROCEPHIN) 2 g in sodium chloride 0.9 % 100 mL IVPB (0 g Intravenous Stopped 03/20/22 0939)  azithromycin (ZITHROMAX) 500 mg in sodium chloride 0.9 % 250 mL IVPB (0 mg Intravenous Stopped 03/20/22 0939)  acetaminophen (TYLENOL) tablet 650 mg  (has no administration in time range)  prochlorperazine (COMPAZINE) injection 5 mg (has no administration in time range)  melatonin tablet 5 mg (has no administration in time range)  methylPREDNISolone sodium succinate (SOLU-MEDROL) 40 mg/mL injection 40 mg (40 mg Intravenous Given 03/20/22 0201)  ipratropium-albuterol (DUONEB) 0.5-2.5 (3) MG/3ML nebulizer solution 3 mL (3 mLs Nebulization Given 03/20/22 1335)  guaiFENesin (ROBITUSSIN) 100 MG/5ML liquid 5 mL (5 mLs Oral Given 03/20/22 0201)  losartan (COZAAR) tablet 100 mg (100 mg Oral Given 03/19/22 2328)    Mobility walks with person assist     Focused Assessments    R Recommendations: See Admitting Provider Note  Report given to:   Additional Notes:

## 2022-03-20 NOTE — Evaluation (Signed)
Occupational Therapy Evaluation Patient Details Name: Sandra Cooper MRN: [id number] DOB: February 02, 1930 Today's Date: 03/20/2022   History of Present Illness 88 year old with history of mild intermittent asthma, left lower lobe lung cancer status postradiation therapy in 2020, chronic anxiety depression, hypertension presents to the ER 03/19/22 from home with shortness of breath and nonproductive cough . CT- infiltrates/pneumonia.   Clinical Impression   [name] is a 88 year old female with above medical history. Prior to hospitalization patient lived alone and mod I for ADLs/IADLs- requiring more time to complete tasks and using walker in the home. Patient now presents with generalized weakness. O2 94-96% on 2L. Patient min guard for supine to sit and set up for UB bathing at EOB. Patient min guard for sit to stand and ambulation to [facility]. Patient set up for toileting. After toileting task patient ambulated around the bed and back, after stated feeling a little dizzy. After activity patient 88 on RA, BP 106/64. Patient left in bed. [place] was put back on patient.  Min guard for most ADLs -requiring supervision due to monitoring of O2 sats. Patient does have frequent assistance from son who lives ~12 mins away, and exhibits to be near baseline. No OT needs at this time.     Recommendations for follow up therapy are one component of a multi-disciplinary discharge planning process, led by the attending physician.  Recommendations may be updated based on patient status, additional functional criteria and insurance authorization.   Follow Up Recommendations  No OT follow up    Assistance Recommended at Discharge Intermittent Supervision/Assistance  Patient can return home with the following Assistance with cooking/housework;Assist for transportation;Help with stairs or ramp for entrance;A little help with bathing/dressing/bathroom    Functional Status Assessment  Patient has not had a recent decline in  their functional status  Equipment Recommendations  None recommended by OT    Recommendations for Other Services       Precautions / Restrictions Precautions Precaution Comments: monitor sats, monitor BP      Mobility Bed Mobility Overal bed mobility: Needs Assistance Bed Mobility: Supine to Sit, Sit to Supine     Supine to sit: Min guard Sit to supine: Min guard   General bed mobility comments: for lines    Transfers Overall transfer level: Needs assistance Equipment used: Standard walker Transfers: Sit to/from Stand, Bed to chair/wheelchair/BSC Sit to Stand: Min guard     Step pivot transfers: Min guard            Balance Overall balance assessment: Mild deficits observed, not formally tested                                         ADL either performed or assessed with clinical judgement   ADL Overall ADL's : Needs assistance/impaired Eating/Feeding: Independent   Grooming: Set up;Sitting;Wash/dry face   Upper Body Bathing: Sitting;Supervision/ safety Upper Body Bathing Details (indicate cue type and reason): requires assistance for washing back Lower Body Bathing: Supervison/ safety   Upper Body Dressing : Supervision/safety   Lower Body Dressing: Supervision/safety   Toilet Transfer: Min guard;BSC/3in1   Toileting- Clothing Manipulation and Hygiene: Set up               Vision Baseline Vision/History: 1 Wears glasses       Perception     Praxis      Pertinent Vitals/Pain Pain  Assessment Pain Assessment: Faces Faces Pain Scale: No hurt     Hand Dominance Right   Extremity/Trunk Assessment Upper Extremity Assessment Upper Extremity Assessment: RUE deficits/detail;LUE deficits/detail RUE Deficits / Details: ROM-WFL; shoulder 5/5, elbow 5/5 RUE Sensation: WNL RUE Coordination: WNL LUE Deficits / Details: ROM-WFL; shoulder 5/5, elbow 5/5 LUE Sensation: WNL LUE Coordination: WNL   Lower Extremity  Assessment Lower Extremity Assessment: Generalized weakness   Cervical / Trunk Assessment Cervical / Trunk Assessment: Kyphotic   Communication Communication Communication: No difficulties   Cognition Arousal/Alertness: Awake/alert Behavior During Therapy: WFL for tasks assessed/performed Overall Cognitive Status: Within Functional Limits for tasks assessed                                       General Comments       Exercises     Shoulder Instructions      Home Living Family/patient expects to be discharged to:: Private residence Living Arrangements: Alone Available Help at Discharge: Family;Available PRN/intermittently Type of Home: House Home Access: Stairs to enter Entrance Stairs-Number of Steps: 3   Home Layout: One level     Bathroom Shower/Tub: Walk-in shower         Home Equipment: Cane - single point;Rollator (4 wheels);Grab bars - tub/shower;Shower seat;Toilet riser   Additional Comments: uses rolator in house PRN, uses cane out      Prior Functioning/Environment Prior Level of Function : Independent/Modified Independent             Mobility Comments: drives          OT Problem List:        OT Treatment/Interventions:      OT Goals(Current goals can be found in the care plan section) Acute Rehab OT Goals Patient Stated Goal: to go home OT Goal Formulation: With patient Potential to Achieve Goals: Good  OT Frequency:      Co-evaluation              AM-PAC OT "6 Clicks" Daily Activity     Outcome Measure Help from another person eating meals?: None Help from another person taking care of personal grooming?: A Little Help from another person toileting, which includes using toliet, bedpan, or urinal?: A Little Help from another person bathing (including washing, rinsing, drying)?: A Little Help from another person to put on and taking off regular upper body clothing?: A Little Help from another person to put on  and taking off regular lower body clothing?: A Little 6 Click Score: 19   End of Session Equipment Utilized During Treatment: Gait belt;Rolling walker (2 wheels) Nurse Communication: Mobility status (No OT needs)  Activity Tolerance: Patient tolerated treatment well Patient left: with call bell/phone within reach;in bed  OT Visit Diagnosis: Muscle weakness (generalized) (M62.81)                Time: 6237-6283 OT Time Calculation (min): 32 min Charges:  OT General Charges $OT Visit: 1 Visit OT Evaluation $OT Eval Low Complexity: 1 Low OT Treatments $Self Care/Home Management : 8-22 mins  Charlann Lange, OTS Acute rehab services   Charlann Lange 03/20/2022, 1:51 PM

## 2022-03-21 ENCOUNTER — Encounter (HOSPITAL_COMMUNITY): Payer: Self-pay | Admitting: Internal Medicine

## 2022-03-21 DIAGNOSIS — I10 Essential (primary) hypertension: Secondary | ICD-10-CM

## 2022-03-21 DIAGNOSIS — C3432 Malignant neoplasm of lower lobe, left bronchus or lung: Secondary | ICD-10-CM | POA: Diagnosis not present

## 2022-03-21 DIAGNOSIS — J189 Pneumonia, unspecified organism: Secondary | ICD-10-CM | POA: Diagnosis not present

## 2022-03-21 DIAGNOSIS — N1832 Chronic kidney disease, stage 3b: Secondary | ICD-10-CM

## 2022-03-21 DIAGNOSIS — J45901 Unspecified asthma with (acute) exacerbation: Secondary | ICD-10-CM

## 2022-03-21 LAB — CBC WITH DIFFERENTIAL/PLATELET
Abs Immature Granulocytes: 0.15 10*3/uL — ABNORMAL HIGH (ref 0.00–0.07)
Basophils Absolute: 0 10*3/uL (ref 0.0–0.1)
Basophils Relative: 0 %
Eosinophils Absolute: 0 10*3/uL (ref 0.0–0.5)
Eosinophils Relative: 0 %
HCT: 34.2 % — ABNORMAL LOW (ref 36.0–46.0)
Hemoglobin: 11.2 g/dL — ABNORMAL LOW (ref 12.0–15.0)
Immature Granulocytes: 1 %
Lymphocytes Relative: 4 %
Lymphs Abs: 0.9 10*3/uL (ref 0.7–4.0)
MCH: 29.2 pg (ref 26.0–34.0)
MCHC: 32.7 g/dL (ref 30.0–36.0)
MCV: 89.1 fL (ref 80.0–100.0)
Monocytes Absolute: 1.1 10*3/uL — ABNORMAL HIGH (ref 0.1–1.0)
Monocytes Relative: 5 %
Neutro Abs: 19.7 10*3/uL — ABNORMAL HIGH (ref 1.7–7.7)
Neutrophils Relative %: 90 %
Platelets: 345 10*3/uL (ref 150–400)
RBC: 3.84 MIL/uL — ABNORMAL LOW (ref 3.87–5.11)
RDW: 15.1 % (ref 11.5–15.5)
WBC: 21.9 10*3/uL — ABNORMAL HIGH (ref 4.0–10.5)
nRBC: 0 % (ref 0.0–0.2)

## 2022-03-21 LAB — BASIC METABOLIC PANEL
Anion gap: 6 (ref 5–15)
BUN: 44 mg/dL — ABNORMAL HIGH (ref 8–23)
CO2: 24 mmol/L (ref 22–32)
Calcium: 8.5 mg/dL — ABNORMAL LOW (ref 8.9–10.3)
Chloride: 106 mmol/L (ref 98–111)
Creatinine, Ser: 0.92 mg/dL (ref 0.44–1.00)
GFR, Estimated: 59 mL/min — ABNORMAL LOW (ref >60)
Glucose, Bld: 129 mg/dL — ABNORMAL HIGH (ref 70–99)
Potassium: 3.6 mmol/L (ref 3.5–5.1)
Sodium: 136 mmol/L (ref 135–145)

## 2022-03-21 NOTE — Progress Notes (Addendum)
  Progress Note   Patient: Sandra Cooper JOI:[id number] DOB: [birth year] DOA: 03/19/2022     2 DOS: the patient was seen and examined on 03/21/2022 at 10:33 AM      Brief hospital course: Sandra Cooper is a 88 y.o. F with HTN, asthma, -year-old with history of mild intermittent asthma, and stage Ia NSCLC s/p radiation 2020 in remission who presented with SOB, nonproductive cough and dyspnea.    In urgent care, SpO2 80%, reportedly drove herslef to the ER.    There, WBC 29, CXR with new infiltrate RLL, COVID negative.        Assessment and Plan: * CAP (community acquired pneumonia) - Continue Rocephin and azithromycin - Incentive spirometry and flutter valve  Stage 3b chronic kidney disease (CKD) (HCC) Creatinine stable relative to baseline  Asthma, chronic, unspecified asthma severity, with acute exacerbation - Continue Solu-Medrol - Continue scheduled bronchodilators - Continue ICS/LABA    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) Treated and resolved  Hypertension, essential Blood pressure normal - Continue amlodipine and losartan          Subjective: Patient is doing well, she thinks she is lost her upper dentures, is frustrated about this.  She feels like her breathing is somewhat labored although she is saturating well on room air at the moment.  No confusion, fever.  No vomiting.  Taking oral well.  Appetite okay.     Physical Exam: BP 107/64 (BP Location: Right Arm)   Pulse 77   Temp 97.9 F (36.6 C) (Oral)   Resp 16   Ht 4\' 11"  (1.499 m)   Wt 53 kg   SpO2 94%   BMI 23.60 kg/m   Thin elderly female, sitting on the edge of the bed, interactive and appropriate  RRR, soft systolic murmur, no peripheral edema, no JVD Respiratory rate normal, lungs clear without rales or wheezes Abdomen without distention or swelling Attention normal, affect appropriate, judgment insight appear normal, face symmetric, speech fluent, moves upper  extremities with normal strength and coordination    Data Reviewed: Basic metabolic panel shows normal sodium, potassium, and creatinine, BUN to creatinine ratio slightly elevated Procalcitonin elevated Complete blood count shows improving leukocytosis, near normal hemoglobin, normal platelets COVID panel negative Chest x-ray with patchy right lower lobe pneumonia   Family Communication: Son by phone    Disposition: Status is: Inpatient Patient now mentating at baseline, taking orals.  Temp < 100 F, heart rate < 100bpm, RR < 24  PT have recommended SNF.   Still requiring O2 this morning, weaned to room air at rest this afternoon, not yet ambulated without O2.          Author: Edwin Dada, MD 03/21/2022 4:33 PM  For on call review [email or web address].

## 2022-03-21 NOTE — Assessment & Plan Note (Signed)
Creatinine stable relative to baseline

## 2022-03-21 NOTE — Progress Notes (Signed)
Mobility Specialist - Progress Note   Pre-mobility: 93% SpO2 During mobility: 95% SpO2 Post-mobility: 93% SPO2  03/21/22 1354  Oxygen Therapy  O2 Device Nasal Cannula  O2 Flow Rate (L/min) 2 L/min  Mobility  Activity Ambulated with assistance in hallway;Ambulated with assistance to bathroom  Level of Assistance Standby assist, set-up cues, supervision of patient - no hands on  Assistive Device Front wheel walker  Distance Ambulated (ft) 460 ft  Range of Motion/Exercises Active  Activity Response Tolerated well  Mobility Referral Yes  $Mobility charge 1 Mobility   Pt was found in bed and agreeable to ambulate. Had no complaints and at EOS returned to bed with all necessities in reach and son in room.   Ferd Hibbs Mobility Specialist

## 2022-03-21 NOTE — Assessment & Plan Note (Signed)
Treated and resolved °

## 2022-03-21 NOTE — Assessment & Plan Note (Addendum)
-   Continue Rocephin and azithromycin - Incentive spirometry and flutter valve

## 2022-03-21 NOTE — Hospital Course (Signed)
Sandra Cooper is a 88 y.o. F with HTN, asthma, -year-old with history of mild intermittent asthma, and stage Ia NSCLC s/p radiation 2020 in remission who presented with SOB, nonproductive cough and dyspnea.    In urgent care, SpO2 80%, reportedly drove herslef to the ER.    There, WBC 29, CXR with new infiltrate RLL, COVID negative.

## 2022-03-21 NOTE — Assessment & Plan Note (Addendum)
Blood pressure normal - Continue amlodipine and losartan

## 2022-03-21 NOTE — Assessment & Plan Note (Signed)
-   Continue Solu-Medrol - Continue scheduled bronchodilators - Continue ICS/LABA

## 2022-03-22 DIAGNOSIS — I10 Essential (primary) hypertension: Secondary | ICD-10-CM | POA: Diagnosis not present

## 2022-03-22 DIAGNOSIS — J189 Pneumonia, unspecified organism: Secondary | ICD-10-CM | POA: Diagnosis not present

## 2022-03-22 DIAGNOSIS — C3432 Malignant neoplasm of lower lobe, left bronchus or lung: Secondary | ICD-10-CM | POA: Diagnosis not present

## 2022-03-22 DIAGNOSIS — J45901 Unspecified asthma with (acute) exacerbation: Secondary | ICD-10-CM | POA: Diagnosis not present

## 2022-03-22 LAB — CBC
HCT: 35.8 % — ABNORMAL LOW (ref 36.0–46.0)
Hemoglobin: 11.4 g/dL — ABNORMAL LOW (ref 12.0–15.0)
MCH: 28.4 pg (ref 26.0–34.0)
MCHC: 31.8 g/dL (ref 30.0–36.0)
MCV: 89.3 fL (ref 80.0–100.0)
Platelets: 402 10*3/uL — ABNORMAL HIGH (ref 150–400)
RBC: 4.01 MIL/uL (ref 3.87–5.11)
RDW: 15 % (ref 11.5–15.5)
WBC: 15.3 10*3/uL — ABNORMAL HIGH (ref 4.0–10.5)
nRBC: 0 % (ref 0.0–0.2)

## 2022-03-22 MED ORDER — TRAMADOL HCL 50 MG PO TABS
50.0000 mg | ORAL_TABLET | Freq: Once | ORAL | Status: AC
  Administered 2022-03-22: 50 mg via ORAL
  Filled 2022-03-22: qty 1

## 2022-03-22 MED ORDER — AZITHROMYCIN 250 MG PO TABS
500.0000 mg | ORAL_TABLET | Freq: Every day | ORAL | Status: DC
  Administered 2022-03-22: 500 mg via ORAL
  Filled 2022-03-22: qty 2

## 2022-03-22 NOTE — TOC Progression Note (Signed)
Transition of Care [facility]) - Progression Note    Patient Details  Name: Sandra Cooper MRN: [id number] Date of Birth: Jun 12, 1929  Transition of Care [facility]) CM/SW Contact  Henrietta Dine, RN Phone Number: 03/22/2022, 11:59 AM  Clinical Narrative:    PT recommendation for SNF; notified Dr. Loleta Books; he says the family refused SNF and would like HHPT instead; will contact family to discuss.       Expected Discharge Plan and Services           Expected Discharge Date: 03/22/22                                     Social Determinants of Health (SDOH) Interventions Food Insecurity Interventions: Intervention Not Indicated  Readmission Risk Interventions     No data to display

## 2022-03-22 NOTE — Progress Notes (Signed)
  Progress Note   Patient: Sandra Cooper YYF:[id number] DOB: [birth year] DOA: 03/19/2022     3 DOS: the patient was seen and examined on 03/22/2022 at 10:08 AM and 11:30 AM      Brief hospital course: Mrs. Gunner is a 88 y.o. F with HTN, asthma, -year-old with history of mild intermittent asthma, and stage Ia NSCLC s/p radiation 2020 in remission who presented with SOB, nonproductive cough and dyspnea.    In urgent care, SpO2 80%, reportedly drove herslef to the ER.    There, WBC 29, CXR with new infiltrate RLL, COVID negative.        Assessment and Plan: * CAP (community acquired pneumonia) - Continue Rocephin and azithromycin day 4 of 7 - Incentive spirometry and flutter valve    Asthma, chronic, unspecified asthma severity, with acute exacerbation - Continue Solu-Medrol - Continue scheduled bronchodilators - Continue ICS/LABA      Hypertension, essential Blood pressure normal - Continue amlodipine and losartan          Subjective: Patient very shaky and weak after walking, significantly out of breath, quite debilitated.  No fever, confusion, chest pain.     Physical Exam: BP 133/67 (BP Location: Left Arm)   Pulse 89   Temp 97.8 F (36.6 C) (Oral)   Resp 16   Ht 4\' 11"  (1.499 m)   Wt 53 kg   SpO2 93%   BMI 23.60 kg/m   Thin elderly female, sitting up in recliner, interactive and appropriate, appears tired and out of breath after walking 40 feet Tachycardic, regular, soft systolic murmur, no peripheral edema, no JVD Respiratory rate normal, kyphotic and so the lung sounds are overall diminished and there are some rhonchi on the right, but no rales, no wheezing Abdomen without distention Attention normal, affect appropriate, judgment and insight appear normal for age, face symmetric, speech fluent, mild tremor but strength in the upper extremities appears normal  Data Reviewed: White blood cell count still up as did 15, hemoglobin 11, no change  Family  Communication: Son at the bedside    Disposition: Status is: Inpatient Patient presented with pneumonia, she has a PSI risk class IV, warrants inpatient treatment, and still significantly symptomatic with ambulating short distance on room air.  Given PT recommendations for skilled nursing, but family's plan to discharge to home, I recommend continued treatment in the hospital 1 additional day, likely home tomorrow.           Author: Edwin Dada, MD 03/22/2022 1:27 PM  For on call review [email or web address].

## 2022-03-22 NOTE — TOC Initial Note (Addendum)
Transition of Care [facility]) - Initial/Assessment Note    Patient Details  Name: Sandra Cooper MRN: [id number] Date of Birth: 1929-08-20  Transition of [facility]) CM/SW Contact:    Sandra Dine, RN Phone Number: 03/22/2022, 2:25 PM  Clinical Narrative:                 Spoke with pt in room and son, Sandra Cooper via phone [phone]); she is from home and the plans to return there; she has glasses, lower dentures, cane, and walker; PT recc SNF but family declined and would like HHPT instead; pt is agreeable and says her sister from [facility] is coming to stay with her; they do not have an agency preference; MD aware and will place [facility] orders; awaiting orders.  - 1519- pt accepted by [facility]; spoke with Sandra Cooper; TOC will con't to follow.  Expected Discharge Plan: [place] Barriers to Discharge: Continued Medical Work up   Patient Goals and CMS Choice        Expected Discharge Plan and Services Expected Discharge Plan: [place]   Discharge Planning Services: CM Consult   Living arrangements for the past 2 months: Single Family Home Expected Discharge Date: 03/22/22                                    Prior Living Arrangements/Services Living arrangements for the past 2 months: Single Family Home Lives with:: Self Patient language and need for interpreter reviewed:: Yes Do you feel safe going back to the place where you live?: Yes      Need for Family Participation in Patient Care: Yes (Comment) Care giver support system in place?: Yes (comment)   Criminal Activity/Legal Involvement Pertinent to Current Situation/Hospitalization: No - Comment as needed  Activities of Daily Living Home Assistive Devices/Equipment: None ADL Screening (condition at time of admission) Patient's cognitive ability adequate to safely complete daily activities?: Yes Is the patient deaf or have difficulty hearing?: No Does the patient have difficulty seeing, even  when wearing glasses/contacts?: No Does the patient have difficulty concentrating, remembering, or making decisions?: No Patient able to express need for assistance with ADLs?: Yes Does the patient have difficulty dressing or bathing?: No Independently performs ADLs?: Yes (appropriate for developmental age) Does the patient have difficulty walking or climbing stairs?: Yes Weakness of Legs: Both Weakness of Arms/Hands: Both  Permission Sought/Granted Permission sought to share information with : Case Manager Permission granted to share information with : Yes, Verbal Permission Granted  Share Information with NAME: Sandra Coffin, RN, CM     Permission granted to share info w Relationship: Sandra Cooper (son) [phone]     Emotional Assessment Appearance:: Appears stated age   Affect (typically observed): Accepting Orientation: : Oriented to Self, Oriented to Place, Oriented to  Time, Oriented to Situation Alcohol / Substance Use: Not Applicable Psych Involvement: No (comment)  Admission diagnosis:  Pneumonia [J18.9] CAP (community acquired pneumonia) [J18.9] Acute respiratory failure with hypoxia ([place]) [J96.01] Community acquired pneumonia of right lower lobe of lung [J18.9] Patient Active Problem List   Diagnosis Date Noted   Asthma, chronic, unspecified asthma severity, with acute exacerbation 03/21/2022   Stage 3b chronic kidney disease (CKD) ([place]) 03/21/2022   CAP (community acquired pneumonia) 03/19/2022   Goals of care, counseling/discussion 03/27/2019   Primary malignant neoplasm of bronchus of left lower lobe ([place]) 03/16/2019   Dyspnea  03/10/2019   Hemoptysis 03/09/2019   Lung mass 02/03/2019   Post-menopausal osteoporosis 11/24/2018   Hypertension, essential 02/10/2016   PCP:  Sandra Noon, MD Pharmacy:   [facility] [place], [place] - [address] AT [address] [place] [address] Phone: [phone]  Fax: [phone]     Social Determinants of Health (SDOH) Interventions Food Insecurity Interventions: Intervention Not Indicated  Readmission Risk Interventions     No data to display

## 2022-03-23 DIAGNOSIS — J189 Pneumonia, unspecified organism: Secondary | ICD-10-CM | POA: Diagnosis not present

## 2022-03-23 LAB — CBC
HCT: 35.9 % — ABNORMAL LOW (ref 36.0–46.0)
Hemoglobin: 11.5 g/dL — ABNORMAL LOW (ref 12.0–15.0)
MCH: 28.8 pg (ref 26.0–34.0)
MCHC: 32 g/dL (ref 30.0–36.0)
MCV: 90 fL (ref 80.0–100.0)
Platelets: 409 10*3/uL — ABNORMAL HIGH (ref 150–400)
RBC: 3.99 MIL/uL (ref 3.87–5.11)
RDW: 15.2 % (ref 11.5–15.5)
WBC: 15.2 10*3/uL — ABNORMAL HIGH (ref 4.0–10.5)
nRBC: 0 % (ref 0.0–0.2)

## 2022-03-23 MED ORDER — AZITHROMYCIN 250 MG PO TABS: 250.0000 mg | ORAL_TABLET | Freq: Every evening | ORAL | 0 refills | Status: AC

## 2022-03-23 MED ORDER — IPRATROPIUM-ALBUTEROL 0.5-2.5 (3) MG/3ML IN SOLN: 3.0000 mL | Freq: Two times a day (BID) | RESPIRATORY_TRACT | Status: DC

## 2022-03-23 MED ORDER — CEFDINIR 300 MG PO CAPS: 300.0000 mg | ORAL_CAPSULE | Freq: Two times a day (BID) | ORAL | 0 refills | Status: AC

## 2022-03-23 NOTE — Discharge Summary (Signed)
Physician Discharge Summary   Patient: Sandra Cooper MRN: [id number] DOB: [birth year]  Admit date:     03/19/2022  Discharge date: 03/23/22  Discharge Physician: Edwin Dada   PCP: [name], MD     Recommendations at discharge:  Follow-up with PCP Dr. Melford Aase for pneumonia and asthma in 1 week     Discharge Diagnoses: Principal Problem:   CAP (community acquired pneumonia) Active Problems:   Hypertension, essential   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)   Asthma, chronic, unspecified asthma severity, with acute exacerbation   Stage 3b chronic kidney disease (CKD) [facility])       Hospital Course: Mrs. Mims is a 88 y.o. F with HTN, asthma, -year-old with history of mild intermittent asthma, and stage Ia NSCLC s/p radiation 2020 in remission who presented with SOB, nonproductive cough and dyspnea.    In urgent care, SpO2 80%, reportedly drove herslef to the ER.    There, WBC 29, CXR with new infiltrate RLL, COVID negative.        * CAP (community acquired pneumonia) Asthma, chronic, unspecified asthma severity, with acute exacerbation Patient admitted and started on Rocephin, azithromycin, and steroids for pneumonia and asthma exacerbation respectively.  Treated for 4 days, patient now mentating at baseline, taking orals, afebrile, heart rate < 100bpm, RR < 24, SpO2 at baseline.   Stable for discharge.    Discharged with 3 more days cefdinir, azithromycin, and albuterol           The [place] was reviewed for this patient prior to discharge.   Consultants: None needed Procedures performed: Chest x-ray Disposition: Home SNF recommended, patient deferred and would prefer home health or nothing at all Diet recommendation:  Regular diet  DISCHARGE MEDICATION: Allergies as of 03/23/2022       Reactions   Fluoxetine Swelling, Other (See Comments)   Facial swelling and shakiness         Medication List     TAKE these medications    AeroChamber MV inhaler Use as instructed   albuterol 108 (90 Base) MCG/ACT inhaler Commonly known as: VENTOLIN HFA Inhale 2 puffs into the lungs every 6 (six) hours as needed for wheezing or shortness of breath.   amLODipine 10 MG tablet Commonly known as: NORVASC Take 10 mg by mouth daily.   ASPIRIN PO Take 1 tablet by mouth daily.   azithromycin 250 MG tablet Commonly known as: ZITHROMAX Take 1 tablet (250 mg total) by mouth at bedtime for 2 days.   budesonide-formoterol 160-4.5 MCG/ACT inhaler Commonly known as: Symbicort Inhale 2 puffs into the lungs 2 (two) times daily. What changed:  how much to take when to take this   Calcium-Vitamin D 600-400 MG-UNIT Tabs Take 1 tablet by mouth daily.   cefdinir 300 MG capsule Commonly known as: OMNICEF Take 1 capsule (300 mg total) by mouth 2 (two) times daily for 4 days.   EYE HEALTH FORMULA PO Take 1 capsule by mouth daily.   losartan 100 MG tablet Commonly known as: COZAAR Take 100 mg by mouth every evening.   magnesium gluconate 500 MG tablet Commonly known as: MAGONATE Take 500-1,000 mg by mouth daily.   mirtazapine 15 MG tablet Commonly known as: REMERON Take 15 mg by mouth at bedtime.   multivitamin tablet Take 1 tablet by mouth daily.   OVER THE COUNTER MEDICATION Take 1 tablet by mouth See admin instructions. Life Extension, Brain Support Supplement tablets-  Take 1 tablet by mouth once a day   Potassium 99 MG Tabs Take 99 mg by mouth daily.   Salonpas 3.06-06-08 % Ptch Generic drug: Camphor-Menthol-Methyl Sal Place 1 patch onto the skin daily as needed (to painful site).   vitamin B-12 500 MCG tablet Commonly known as: CYANOCOBALAMIN Take 500 mcg by mouth daily.   Vitamin D 50 MCG (2000 UT) Caps Take 2,000 Units by mouth daily.        Follow-up Information     [name], MD. Schedule an appointment as soon as possible for a visit in 1  week(s).   Specialty: Family Medicine Contact information: [place] [address] [phone]         Care, [facility] Follow up.   Specialty: Home Health Services Why: Your home health has been set up with [facility]. the office will call you with start of care information. If you have any questions or concerns please call the number listed above. Contact information: [place] [address] [place] [address] [phone]                 Discharge Instructions     Discharge instructions   Complete by: As directed    **IMPORTANT DISCHARGE INSTRUCTIONS**   From Dr. Loleta Books; You were admitted for pneumonia  Here you had a chest x-ray that showed a new infiltrate in the right lower lobe of the lungs  You were treated with Rocephin and Azithromycin  Because we felt that the pneumonia had also triggered some asthma to flare up, you were also treated with steroids and albuterol  You should finish the antibiotics with one more day of azithromycin tonight and three more days of an antibiotic called cefdinir, which is an oral equivalent of Rocephin  Take cefdinir 300 mg twice daily starting tonight  Take azithromycin 250 mg once more tonight (it is a long acting medication and stays in your system working for a few days more, hence why we use it for less time than cefdinir)  Go see Dr. Melford Aase in 1 week  Keep using the incentive spirometer once breath at least every hour  Take it easy  Use your albtuerol at home for any time you feel out of breath  If your oxygen level is LESS THAN 88% and stays there and does not come up for 10-15 minutes, you should return to seek care  If you have fever that returns, also seek care  If things look stable, it is okay to call Dr. Melford Aase first, but if things look urgent (which is unlikely), just call 9-1-1 instead.   Increase activity slowly   Complete by: As directed        Discharge Exam: Filed  Weights   03/19/22 1901  Weight: 53 kg    General:  Pt is alert, awake, not in acute distress, sitting up in window seat Cardiovascular: RRR, nl S1-S2, no murmurs appreciated.   No LE edema.   Respiratory: Normal respiratory rate and rhythm.  Kyphotic, overall diminished air movement but no rales or wheezing Abdominal: Abdomen soft and non-tender.  No distension or HSM.   Neuro/Psych: Strength symmetric in upper and lower extremities.  Judgment and insight appear normal.   Condition at discharge: fair  The results of significant diagnostics from this hospitalization (including imaging, microbiology, ancillary and laboratory) are listed below for reference.   Imaging Studies: DG Chest 2 View  Result Date: 03/19/2022 CLINICAL DATA:  Shortness of breath, chills, body aches and fatigue. Non-small cell lung cancer diagnosed October 2020 with radiation therapy at that time, left lower lobe lateral base. EXAM: CHEST - 2 VIEW COMPARISON:  Chest CTs with contrast 06/16/2021 and 09/27/2020 FINDINGS: There is a patchy hazy infiltrate in the right lower lung field, with scattered linear atelectatic foci, most likely a combination of pneumonia and atelectasis. On the left, in the lateral lower lobe base there is opacity abutting the lateral hemidiaphragm consistent with the known treated lesion, with adjacent streaky scar-like linear opacities tracking medially from the lesion to the hilum. The appearance is similar to the prior chest CTs with no new abnormality on the left. A pleurodiaphragmatic adhesion in the lateral left base producing sulcal blunting as before. The remaining lungs are clear. The cardiac size is normal. The aorta is tortuous and calcified with stable mediastinum. Severe chronic compression fracture again noted T3 vertebral body, mild chronic wedging at T6 and T11. There is osteopenia and chronic thoracic kyphosis. IMPRESSION: Patchy infiltrate in the right lower lung field most likely due  to pneumonia, with scattered linear atelectatic foci. This is best seen on the PA, not well localized on the lateral. Stable chronic appearance of treated neoplasm in the left lower lobe with scar-like opacities extending to the hilum from the lesion. Osteopenia, chronic compression fractures and kyphosis. Electronically Signed   By: Telford Nab M.D.   On: 03/19/2022 20:17    Microbiology: Results for orders placed or performed during the hospital encounter of 03/19/22  SARS Coronavirus 2 by RT PCR (hospital order, performed in [facility] hospital lab) *cepheid single result test* Anterior Nasal Swab     Status: None   Collection Time: 03/19/22  8:16 PM   Specimen: Anterior Nasal Swab  Result Value Ref Range Status   SARS Coronavirus 2 by RT PCR NEGATIVE NEGATIVE Final    Comment: (NOTE) SARS-CoV-2 target nucleic acids are NOT DETECTED.  The SARS-CoV-2 RNA is generally detectable in upper and lower respiratory specimens during the acute phase of infection. The lowest concentration of SARS-CoV-2 viral copies this assay can detect is 250 copies / mL. A negative result does not preclude SARS-CoV-2 infection and should not be used as the sole basis for treatment or other patient management decisions.  A negative result may occur with improper specimen collection / handling, submission of specimen other than nasopharyngeal swab, presence of viral mutation(s) within the areas targeted by this assay, and inadequate number of viral copies (<250 copies / mL). A negative result must be combined with clinical observations, patient history, and epidemiological information.  Fact Sheet for Patients:   https://www.patel.info/  Fact Sheet for Healthcare Providers: https://hall.com/  This test is not yet approved or  cleared by the [place] FDA and has been authorized for detection and/or diagnosis of SARS-CoV-2 by FDA under an Emergency Use  Authorization (EUA).  This EUA will remain in effect (meaning this test can be used) for the duration of the COVID-19 declaration under Section 564(b)(1) of the Act, 21 U.S.C. section 360bbb-3(b)(1), unless the authorization is terminated or revoked sooner.  Performed at [facility], [place] [address], [place], [place] [address]     Labs: CBC: Recent Labs  Lab 03/19/22 2000 03/20/22 0500 03/21/22 0737 03/22/22 0632 03/23/22 0520  WBC 29.5* 27.4* 21.9* 15.3* 15.2*  NEUTROABS 25.7* 25.3* 19.7*  --   --   HGB 12.8 11.5* 11.2* 11.4* 11.5*  HCT 40.1 35.6* 34.2* 35.8* 35.9*  MCV 90.1 89.9 89.1 89.3 90.0  PLT 366 307 345 402* 957*   Basic Metabolic Panel: Recent Labs  Lab 03/19/22 2000 03/20/22 0500 03/21/22 0737  NA 136 138 136  K 4.2 3.6 3.6  CL 102 107 106  CO2 25 24 24   GLUCOSE 121* 132* 129*  BUN 23 20 44*  CREATININE 1.08* 0.98 0.92  CALCIUM 9.3 8.7* 8.5*  MG  --  2.6*  --   PHOS  --  2.6  --    Liver Function Tests: No results for input(s): "AST", "ALT", "ALKPHOS", "BILITOT", "PROT", "ALBUMIN" in the last 168 hours. CBG: No results for input(s): "GLUCAP" in the last 168 hours.  Discharge time spent: approximately 35 minutes spent on discharge counseling, evaluation of patient on day of discharge, and coordination of discharge planning with nursing, social work, pharmacy and case management  Signed: Edwin Dada, MD [facility] 03/23/2022

## 2022-03-23 NOTE — TOC Transition Note (Signed)
Transition of Care [facility]) - CM/SW Discharge Note   Patient Details  Name: Sandra Cooper MRN: [id number] Date of Birth: Nov 21, 1929  [facility]) CM/SW Contact:  Angelita Ingles, RN Phone Number:[phone]  03/23/2022, 11:15 AM   Clinical Narrative:    Patient discharging home with home health. HH previously arranged. AVS updated. No other needs noted at this time. TOC will sign off.      Barriers to Discharge: Continued Medical Work up   Patient Goals and CMS Choice        Discharge Placement                       Discharge Plan and Services   Discharge Planning Services: CM Consult                      HH Arranged: PT, OT [place] Agency: [place] Date [place]: 03/22/22 Time [place]: [place] Community Representative spoke with at [place]: Tommi Rumps  Social Determinants of Health (SDOH) Interventions Food Insecurity Interventions: Intervention Not Indicated   Readmission Risk Interventions     No data to display

## 2022-03-23 NOTE — Progress Notes (Addendum)
Pt discharged to home. Dc instructions given. No concerns voiced. Son updated upon arrival. Pt left unit in wheelchair pushed by this Probation officer. Left in stable condition. Pt reminded to stop by pharmacy and pick up meds x 2 that were e-prescribed. Verbalized understanding.

## 2022-03-23 NOTE — Progress Notes (Signed)
Physical Therapy Treatment Patient Details Name: Sandra Cooper MRN: [id number] DOB: 1929-12-06 Today's Date: 03/23/2022   History of Present Illness 88 year old with history of mild intermittent asthma, left lower lobe lung cancer status postradiation therapy in 2020, chronic anxiety depression, hypertension presents to the ER 03/19/22 from home with shortness of breath and nonproductive cough . CT- infiltrates/pneumonia.    PT Comments    Pt ambulated in hallway and overall supervision.  Pt's family declined SNF recommendation from evaluation however pt appears to have family able to assist upon d/c and was able to mobilize safely with RW today.  No supplemental oxygen required for activity as SpO2 93-94% on room air during ambulation.  Pt anticipates d/c home today.  Updated recommendations for HHPT.    Recommendations for follow up therapy are one component of a multi-disciplinary discharge planning process, led by the attending physician.  Recommendations may be updated based on patient status, additional functional criteria and insurance authorization.  Follow Up Recommendations  Home health PT Can patient physically be transported by private vehicle: Yes   Assistance Recommended at Discharge Intermittent Supervision/Assistance  Patient can return home with the following A little help with walking and/or transfers;Help with stairs or ramp for entrance;Assistance with cooking/housework   Equipment Recommendations  None recommended by PT    Recommendations for Other Services       Precautions / Restrictions Precautions Precautions: None     Mobility  Bed Mobility               General bed mobility comments: pt in recliner    Transfers Overall transfer level: Needs assistance Equipment used: Rolling walker (2 wheels) Transfers: Sit to/from Stand Sit to Stand: Supervision                Ambulation/Gait Ambulation/Gait assistance: Supervision Gait Distance  (Feet): 280 Feet Assistive device: Rolling walker (2 wheels) Gait Pattern/deviations: Step-through pattern, Decreased stride length       General Gait Details: appears steady with RW, denies symptoms, monitored SpO2 and mostly 93-94% on room air during ambulation   Stairs             Wheelchair Mobility    Modified Rankin (Stroke Patients Only)       Balance                                            Cognition Arousal/Alertness: Awake/alert Behavior During Therapy: WFL for tasks assessed/performed Overall Cognitive Status: Within Functional Limits for tasks assessed                                          Exercises      General Comments        Pertinent Vitals/Pain Pain Assessment Pain Assessment: No/denies pain    Home Living                          Prior Function            PT Goals (current goals can now be found in the care plan section) Progress towards PT goals: Progressing toward goals    Frequency    Min 3X/week      PT Plan Discharge plan needs to be updated  Co-evaluation              AM-PAC PT "6 Clicks" Mobility   Outcome Measure  Help needed turning from your back to your side while in a flat bed without using bedrails?: A Little Help needed moving from lying on your back to sitting on the side of a flat bed without using bedrails?: A Little Help needed moving to and from a bed to a chair (including a wheelchair)?: A Little Help needed standing up from a chair using your arms (e.g., wheelchair or bedside chair)?: A Little Help needed to walk in hospital room?: A Little Help needed climbing 3-5 steps with a railing? : A Little 6 Click Score: 18    End of Session   Activity Tolerance: Patient tolerated treatment well Patient left: in chair;with call bell/phone within reach   PT Visit Diagnosis: Unsteadiness on feet (R26.81);Muscle weakness (generalized)  (M62.81);Difficulty in walking, not elsewhere classified (R26.2)     Time: 1101-1109 PT Time Calculation (min) (ACUTE ONLY): 8 min  Charges:  $Gait Training: 8-22 mins                    Jannette Spanner PT, DPT Physical Therapist Acute Rehabilitation Services Preferred contact method: Secure Chat Weekend Pager Only: [phone] Office: [place] 03/23/2022, 4:07 PM

## 2023-03-29 ENCOUNTER — Telehealth: Payer: Self-pay | Admitting: Medical Oncology

## 2023-03-29 NOTE — Telephone Encounter (Signed)
Requested Radiology  to push images to [facility]

## 2023-11-09 ENCOUNTER — Other Ambulatory Visit: Payer: Self-pay

## 2023-11-09 ENCOUNTER — Emergency Department (HOSPITAL_COMMUNITY)
Admission: EM | Admit: 2023-11-09 | Discharge: 2023-11-09 | Disposition: A | Discharge status: Home / Self Care | Attending: Emergency Medicine | Admitting: Emergency Medicine

## 2023-11-09 ENCOUNTER — Emergency Department (HOSPITAL_BASED_OUTPATIENT_CLINIC_OR_DEPARTMENT_OTHER)

## 2023-11-09 DIAGNOSIS — J45909 Unspecified asthma, uncomplicated: Secondary | ICD-10-CM | POA: Insufficient documentation

## 2023-11-09 DIAGNOSIS — I10 Essential (primary) hypertension: Secondary | ICD-10-CM | POA: Diagnosis not present

## 2023-11-09 DIAGNOSIS — M79605 Pain in left leg: Secondary | ICD-10-CM | POA: Diagnosis present

## 2023-11-09 DIAGNOSIS — Z85118 Personal history of other malignant neoplasm of bronchus and lung: Secondary | ICD-10-CM | POA: Insufficient documentation

## 2023-11-09 DIAGNOSIS — Z7982 Long term (current) use of aspirin: Secondary | ICD-10-CM | POA: Diagnosis not present

## 2023-11-09 DIAGNOSIS — M79662 Pain in left lower leg: Secondary | ICD-10-CM

## 2023-11-09 DIAGNOSIS — Z79899 Other long term (current) drug therapy: Secondary | ICD-10-CM | POA: Insufficient documentation

## 2023-11-09 DIAGNOSIS — L538 Other specified erythematous conditions: Secondary | ICD-10-CM | POA: Diagnosis not present

## 2023-11-09 DIAGNOSIS — Z7951 Long term (current) use of inhaled steroids: Secondary | ICD-10-CM | POA: Insufficient documentation

## 2023-11-09 LAB — COMPREHENSIVE METABOLIC PANEL WITH GFR
ALT: 21 U/L (ref 0–44)
AST: 26 U/L (ref 15–41)
Albumin: 3.2 g/dL — ABNORMAL LOW (ref 3.5–5.0)
Alkaline Phosphatase: 56 U/L (ref 38–126)
Anion gap: 10 (ref 5–15)
BUN: 18 mg/dL (ref 8–23)
CO2: 22 mmol/L (ref 22–32)
Calcium: 8.7 mg/dL — ABNORMAL LOW (ref 8.9–10.3)
Chloride: 107 mmol/L (ref 98–111)
Creatinine, Ser: 0.69 mg/dL (ref 0.44–1.00)
GFR, Estimated: >60 mL/min (ref >60)
Glucose, Bld: 96 mg/dL (ref 70–99)
Potassium: 4 mmol/L (ref 3.5–5.1)
Sodium: 139 mmol/L (ref 135–145)
Total Bilirubin: 0.7 mg/dL (ref 0.0–1.2)
Total Protein: 6 g/dL — ABNORMAL LOW (ref 6.5–8.1)

## 2023-11-09 LAB — CBC WITH DIFFERENTIAL/PLATELET
Abs Immature Granulocytes: 0.02 10*3/uL (ref 0.00–0.07)
Basophils Absolute: 0.1 10*3/uL (ref 0.0–0.1)
Basophils Relative: 1 %
Eosinophils Absolute: 0.1 10*3/uL (ref 0.0–0.5)
Eosinophils Relative: 2 %
HCT: 41.1 % (ref 36.0–46.0)
Hemoglobin: 13.1 g/dL (ref 12.0–15.0)
Immature Granulocytes: 0 %
Lymphocytes Relative: 20 %
Lymphs Abs: 1.2 10*3/uL (ref 0.7–4.0)
MCH: 29 pg (ref 26.0–34.0)
MCHC: 31.9 g/dL (ref 30.0–36.0)
MCV: 90.9 fL (ref 80.0–100.0)
Monocytes Absolute: 0.5 10*3/uL (ref 0.1–1.0)
Monocytes Relative: 8 %
Neutro Abs: 4.1 10*3/uL (ref 1.7–7.7)
Neutrophils Relative %: 69 %
Platelets: 415 10*3/uL — ABNORMAL HIGH (ref 150–400)
RBC: 4.52 MIL/uL (ref 3.87–5.11)
RDW: 14.6 % (ref 11.5–15.5)
WBC: 6 10*3/uL (ref 4.0–10.5)
nRBC: 0 % (ref 0.0–0.2)

## 2023-11-09 NOTE — ED Provider Notes (Signed)
 [place] EMERGENCY DEPARTMENT AT [facility] Provider Note   CSN: [id number] Arrival date & time: 11/09/23  1914     History HTN, Asthma  Chief Complaint  Patient presents with   Leg Pain    Sandra Cooper is a 89 y.o. female.  89 year old female with a past medical history of hypertension, asthma, lung cancer presents to the ED with a chief complaint of left leg pain which has been ongoing for the past 2 days.  Patient reports waking up in her bed, suddenly noticed redness along the left shin.  She reports she did not hit her leg, did not have any type of trauma.  She did take some Tylenol  to help with pain control without much improvement in symptoms.  She does not have any prior history of blood clots.  Is endorsing a cough, with some green mucus that has been ongoing for the past 3 to 4 days but has been relieved by Delsym .  Currently not on any blood thinners, no other complaints reported.  The history is provided by the patient and a relative.  Leg Pain Associated symptoms: no fever        Home Medications Prior to Admission medications   Medication Sig Start Date End Date Taking? Authorizing Provider  albuterol  (VENTOLIN  HFA) 108 (90 Base) MCG/ACT inhaler Inhale 2 puffs into the lungs every 6 (six) hours as needed for wheezing or shortness of breath. 03/15/19   Adrienne Horning, MD  amLODipine  (NORVASC ) 10 MG tablet Take 10 mg by mouth daily.    [provider]  ASPIRIN PO Take 1 tablet by mouth daily.    [provider]  budesonide -formoterol  (SYMBICORT ) 160-4.5 MCG/ACT inhaler Inhale 2 puffs into the lungs 2 (two) times daily. Patient taking differently: Inhale 1 puff into the lungs in the morning and at bedtime. 04/13/19   Sood, Vineet, MD  Calcium Carb-Cholecalciferol (CALCIUM-VITAMIN D) 600-400 MG-UNIT TABS Take 1 tablet by mouth daily.    [provider]  Camphor-Menthol-Methyl Sal (SALONPAS) 3.06-06-08 % PTCH Place 1 patch onto the skin  daily as needed (to painful site).    [provider]  Cholecalciferol (VITAMIN D) 50 MCG (2000 UT) CAPS Take 2,000 Units by mouth daily.    [provider]  DHA-Vitamin C-Lutein (EYE HEALTH FORMULA PO) Take 1 capsule by mouth daily.    [provider]  losartan  (COZAAR ) 100 MG tablet Take 100 mg by mouth every evening. 01/21/19   [provider]  magnesium  gluconate (MAGONATE) 500 MG tablet Take 500-1,000 mg by mouth daily.    [provider]  mirtazapine  (REMERON ) 15 MG tablet Take 15 mg by mouth at bedtime. 11/29/18   [provider]  Multiple Vitamin (MULTIVITAMIN) tablet Take 1 tablet by mouth daily.    [provider]  OVER THE COUNTER MEDICATION Take 1 tablet by mouth See admin instructions. Life Extension, Brain Support Supplement tablets- Take 1 tablet by mouth once a day    [provider]  Potassium 99 MG TABS Take 99 mg by mouth daily.    [provider]  Spacer/Aero-Holding Chambers (AEROCHAMBER MV) inhaler Use as instructed 04/05/19   Sood, Vineet, MD  vitamin B-12 (CYANOCOBALAMIN ) 500 MCG tablet Take 500 mcg by mouth daily.    [provider]      Allergies    Fluoxetine    Review of Systems   Review of Systems  Constitutional:  Negative for fever.  Respiratory:  Negative for  shortness of breath.   Cardiovascular:  Negative for chest pain and leg swelling.  Musculoskeletal:  Positive for myalgias.  Skin:  Positive for color change (bruising to the left lower shin). Negative for wound.  All other systems reviewed and are negative.   Physical Exam Updated Vital Signs BP (!) 146/76 (BP Location: Right Arm)   Pulse 61   Temp 98.3 F (36.8 C)   Resp 16   Ht 4\' 11"  (1.499 m)   Wt 49.9 kg   SpO2 95%   BMI 22.22 kg/m  Physical Exam Vitals and nursing note reviewed.  Constitutional:      Appearance: Normal appearance.  HENT:     Head: Normocephalic and atraumatic.     Mouth/Throat:      Mouth: Mucous membranes are moist.  Cardiovascular:     Rate and Rhythm: Normal rate.     Pulses:          Dorsalis pedis pulses are 2+ on the right side and 2+ on the left side.       Posterior tibial pulses are 2+ on the right side and 2+ on the left side.  Pulmonary:     Effort: Pulmonary effort is normal.  Abdominal:     General: Abdomen is flat.  Musculoskeletal:     Cervical back: Normal range of motion and neck supple.  Skin:    General: Skin is warm and dry.     Findings: Erythema present.  Neurological:     Mental Status: She is alert and oriented to person, place, and time.     Comments: Moves bilateral lower extremities with good strength. RLE- KF,KE 5/5 strength LLE- HF, HE 5/5 strength CN I, II and VIII not tested. CN II-XII grossly intact bilaterally.         ED Results / Procedures / Treatments   Labs (all labs ordered are listed, but only abnormal results are displayed) Labs Reviewed  CBC WITH DIFFERENTIAL/PLATELET - Abnormal; Notable for the following components:      Result Value   Platelets 415 (*)    All other components within normal limits  COMPREHENSIVE METABOLIC PANEL WITH GFR - Abnormal; Notable for the following components:   Calcium 8.7 (*)    Total Protein 6.0 (*)    Albumin 3.2 (*)    All other components within normal limits    EKG None  Radiology VAS US  LOWER EXTREMITY VENOUS (DVT) (ONLY [facility] & [facility]) Result Date: 11/09/2023  Lower Venous DVT Study Patient Name:  [name]   Date of Exam:   11/09/2023 Medical Rec #: [id number]  Accession #:    [id number] Date of Birth: [birth year]  Patient Gender: F Patient Age:   32 years Exam Location:  [facility] Procedure:      VAS US  LOWER EXTREMITY VENOUS (DVT) Referring Phys: Benjiman Bras Ramsay Bognar --------------------------------------------------------------------------------  Indications: Pain, and Erythema.  Comparison Study: No previous exams Performing Technologist: Jody Hill RVT, RDMS  Examination  Guidelines: A complete evaluation includes B-mode imaging, spectral Doppler, color Doppler, and power Doppler as needed of all accessible portions of each vessel. Bilateral testing is considered an integral part of a complete examination. Limited examinations for reoccurring indications may be performed as noted. The reflux portion of the exam is performed with the patient in reverse Trendelenburg.  +-----+---------------+---------+-----------+----------+--------------+ RIGHTCompressibilityPhasicitySpontaneityPropertiesThrombus Aging +-----+---------------+---------+-----------+----------+--------------+ CFV  Full           Yes      Yes                                 +-----+---------------+---------+-----------+----------+--------------+   +---------+---------------+---------+-----------+----------+--------------+  LEFT     CompressibilityPhasicitySpontaneityPropertiesThrombus Aging +---------+---------------+---------+-----------+----------+--------------+ CFV      Full           Yes      Yes                                 +---------+---------------+---------+-----------+----------+--------------+ SFJ      Full                                                        +---------+---------------+---------+-----------+----------+--------------+ FV Prox  Full           Yes      Yes                                 +---------+---------------+---------+-----------+----------+--------------+ FV Mid   Full           Yes      Yes                                 +---------+---------------+---------+-----------+----------+--------------+ FV DistalFull           Yes      Yes                                 +---------+---------------+---------+-----------+----------+--------------+ PFV      Full                                                        +---------+---------------+---------+-----------+----------+--------------+ POP      Full           Yes      Yes                                  +---------+---------------+---------+-----------+----------+--------------+ PTV      Full                                                        +---------+---------------+---------+-----------+----------+--------------+ PERO     Full                                                        +---------+---------------+---------+-----------+----------+--------------+   Left Technical Findings: Left lateral calf evaluated - no evidence of DVT/SVT   Summary: RIGHT: - No evidence of common femoral vein obstruction.   LEFT: - There is no evidence of deep vein thrombosis in the lower extremity.  - No cystic structure found in the popliteal fossa.  *See table(s) above for measurements and observations.    Preliminary  Procedures Procedures    Medications Ordered in ED Medications - No data to display  ED Course/ Medical Decision Making/ A&P                                 Medical Decision Making Amount and/or Complexity of Data Reviewed Labs: ordered.    This patient presents to the ED for concern of left leg pain, this involves a number of treatment options, and is a complaint that carries with it a high risk of complications and morbidity.  The differential diagnosis includes cellulitis, dvt versus trauma.    Co morbidities: Discussed in HPI   Brief History:   See HPI.   EMR reviewed including pt PMHx, past surgical history and past visits to ER.   See HPI for more details   Lab Tests:  I ordered and independently interpreted labs.  The pertinent results include:    I personally reviewed all laboratory work and imaging. Metabolic panel without any acute abnormality specifically kidney function within normal limits and no significant electrolyte abnormalities. CBC without leukocytosis or significant anemia.  Imaging Studies:  NAD. I personally reviewed all imaging studies and no acute abnormality found. I agree with radiology  interpretation.  Reevaluation:  After the interventions noted above I re-evaluated patient and found that they have :stayed the same  Social Determinants of Health:  The patient's social determinants of health were a factor in the care of this patient   Problem List / ED Course:  Patient here with left leg pain that is been ongoing for the past 2 days.  Reports she woke up with redness around the left side of her shin, no trauma noted, no fever, no other complaints reported.  No prior history of blood clots.  CBC with no leukocytosis, hemoglobin is within normal limits.  CMP with no electrolyte derangement, current levels unremarkable peer LFTs are within normal limits.  Slight elevation in her platelets.  Is not complaining of other other complaint at this time.  DVT study did not show any acute findings.  I do suspect likely traumatic injury, however they are adamant that she did not hit her leg.  Vitals are within normal limits. I explained to results to son at the bedside, she does not have any infectious signs such as fever, I do feel that this is likely localized, it does appear traumatic, I discussed with her to continue monitoring, report to the ED if any symptoms worsen.  I discussed this case with my attending Dr. Gaile Jourdain who also agrees with plan's management at this time.   Dispostion:  After consideration of the diagnostic results and the patients response to treatment, I feel that the patent would benefit from follow-up with PCP, RICE therapy.    Portions of this note were generated with Scientist, clinical (histocompatibility and immunogenetics). Dictation errors may occur despite best attempts at proofreading.   Final Clinical Impression(s) / ED Diagnoses Final diagnoses:  Left leg pain    Rx / DC Orders ED Discharge Orders     None         Luellen Sages, PA-C 11/09/23 1304

## 2023-11-09 NOTE — Progress Notes (Signed)
 LLE venous duplex has been completed.  Preliminary results given to Johana Soto, PA-C.   Results can be found under chart review under CV PROC. 11/09/2023 11:39 AM Andri Prestia RVT, RDMS

## 2023-11-09 NOTE — ED Triage Notes (Signed)
 Pt. Stated, I had left lower leg pain for 2 days. Today I have a bruise mark and redness to the front of my lower leg.

## 2023-11-09 NOTE — Discharge Instructions (Addendum)
 Your laboratories were within normal limits today.  You may ice or apply heat to the area to help with symptoms.  You may also alternate Tylenol  versus ibuprofen .  If his pain is any fever, worsening symptoms please return to the emergency department.

## 2024-03-17 ENCOUNTER — Inpatient Hospital Stay (HOSPITAL_COMMUNITY)
Admission: EM | Admit: 2024-03-17 | Discharge: 2024-03-23 | DRG: 871 | Disposition: A | Discharge status: Skilled Nursing Facility | Source: Ambulatory Visit | Attending: Family Medicine | Admitting: Family Medicine

## 2024-03-17 ENCOUNTER — Emergency Department (HOSPITAL_COMMUNITY)

## 2024-03-17 ENCOUNTER — Other Ambulatory Visit: Payer: Self-pay

## 2024-03-17 ENCOUNTER — Encounter (HOSPITAL_COMMUNITY): Payer: Self-pay | Admitting: Emergency Medicine

## 2024-03-17 DIAGNOSIS — C3432 Malignant neoplasm of lower lobe, left bronchus or lung: Secondary | ICD-10-CM

## 2024-03-17 DIAGNOSIS — I10 Essential (primary) hypertension: Secondary | ICD-10-CM | POA: Diagnosis present

## 2024-03-17 DIAGNOSIS — Z7982 Long term (current) use of aspirin: Secondary | ICD-10-CM | POA: Diagnosis not present

## 2024-03-17 DIAGNOSIS — J9601 Acute respiratory failure with hypoxia: Secondary | ICD-10-CM | POA: Diagnosis present

## 2024-03-17 DIAGNOSIS — Z66 Do not resuscitate: Secondary | ICD-10-CM | POA: Diagnosis present

## 2024-03-17 DIAGNOSIS — J690 Pneumonitis due to inhalation of food and vomit: Secondary | ICD-10-CM | POA: Diagnosis present

## 2024-03-17 DIAGNOSIS — Z888 Allergy status to other drugs, medicaments and biological substances status: Secondary | ICD-10-CM

## 2024-03-17 DIAGNOSIS — Z7951 Long term (current) use of inhaled steroids: Secondary | ICD-10-CM

## 2024-03-17 DIAGNOSIS — F32A Depression, unspecified: Secondary | ICD-10-CM | POA: Diagnosis present

## 2024-03-17 DIAGNOSIS — Z79899 Other long term (current) drug therapy: Secondary | ICD-10-CM

## 2024-03-17 DIAGNOSIS — Z1152 Encounter for screening for COVID-19: Secondary | ICD-10-CM

## 2024-03-17 DIAGNOSIS — R739 Hyperglycemia, unspecified: Secondary | ICD-10-CM | POA: Diagnosis not present

## 2024-03-17 DIAGNOSIS — R7303 Prediabetes: Secondary | ICD-10-CM | POA: Diagnosis present

## 2024-03-17 DIAGNOSIS — J440 Chronic obstructive pulmonary disease with acute lower respiratory infection: Secondary | ICD-10-CM | POA: Diagnosis present

## 2024-03-17 DIAGNOSIS — J189 Pneumonia, unspecified organism: Secondary | ICD-10-CM | POA: Diagnosis present

## 2024-03-17 DIAGNOSIS — S2242XG Multiple fractures of ribs, left side, subsequent encounter for fracture with delayed healing: Secondary | ICD-10-CM | POA: Diagnosis not present

## 2024-03-17 DIAGNOSIS — R6520 Severe sepsis without septic shock: Secondary | ICD-10-CM | POA: Diagnosis present

## 2024-03-17 DIAGNOSIS — T380X5A Adverse effect of glucocorticoids and synthetic analogues, initial encounter: Secondary | ICD-10-CM | POA: Diagnosis not present

## 2024-03-17 DIAGNOSIS — M79661 Pain in right lower leg: Secondary | ICD-10-CM | POA: Diagnosis not present

## 2024-03-17 DIAGNOSIS — A419 Sepsis, unspecified organism: Secondary | ICD-10-CM | POA: Diagnosis present

## 2024-03-17 DIAGNOSIS — Z85118 Personal history of other malignant neoplasm of bronchus and lung: Secondary | ICD-10-CM | POA: Diagnosis not present

## 2024-03-17 DIAGNOSIS — Z923 Personal history of irradiation: Secondary | ICD-10-CM | POA: Diagnosis not present

## 2024-03-17 DIAGNOSIS — J449 Chronic obstructive pulmonary disease, unspecified: Secondary | ICD-10-CM

## 2024-03-17 DIAGNOSIS — J441 Chronic obstructive pulmonary disease with (acute) exacerbation: Secondary | ICD-10-CM | POA: Diagnosis present

## 2024-03-17 LAB — TROPONIN I (HIGH SENSITIVITY)
Troponin I (High Sensitivity): 17 ng/L (ref <18)
Troponin I (High Sensitivity): 17 ng/L (ref <18)

## 2024-03-17 LAB — CBC WITH DIFFERENTIAL/PLATELET
Abs Immature Granulocytes: 0.13 K/uL — ABNORMAL HIGH (ref 0.00–0.07)
Basophils Absolute: 0.1 K/uL (ref 0.0–0.1)
Basophils Relative: 0 %
Eosinophils Absolute: 0 K/uL (ref 0.0–0.5)
Eosinophils Relative: 0 %
HCT: 42.5 % (ref 36.0–46.0)
Hemoglobin: 14.1 g/dL (ref 12.0–15.0)
Immature Granulocytes: 1 %
Lymphocytes Relative: 5 %
Lymphs Abs: 1.2 K/uL (ref 0.7–4.0)
MCH: 29.3 pg (ref 26.0–34.0)
MCHC: 33.2 g/dL (ref 30.0–36.0)
MCV: 88.4 fL (ref 80.0–100.0)
Monocytes Absolute: 1.1 K/uL — ABNORMAL HIGH (ref 0.1–1.0)
Monocytes Relative: 5 %
Neutro Abs: 20.1 K/uL — ABNORMAL HIGH (ref 1.7–7.7)
Neutrophils Relative %: 89 %
Platelets: 439 K/uL — ABNORMAL HIGH (ref 150–400)
RBC: 4.81 MIL/uL (ref 3.87–5.11)
RDW: 15.2 % (ref 11.5–15.5)
WBC: 22.7 K/uL — ABNORMAL HIGH (ref 4.0–10.5)
nRBC: 0 % (ref 0.0–0.2)

## 2024-03-17 LAB — RESP PANEL BY RT-PCR (RSV, FLU A&B, COVID)  RVPGX2
Influenza A by PCR: NEGATIVE
Influenza B by PCR: NEGATIVE
Resp Syncytial Virus by PCR: NEGATIVE
SARS Coronavirus 2 by RT PCR: NEGATIVE

## 2024-03-17 LAB — PROTIME-INR
INR: 1.1 (ref 0.8–1.2)
Prothrombin Time: 14.6 s (ref 11.4–15.2)

## 2024-03-17 LAB — CREATININE, SERUM
Creatinine, Ser: 1.08 mg/dL — ABNORMAL HIGH (ref 0.44–1.00)
GFR, Estimated: 48 mL/min — ABNORMAL LOW (ref >60)

## 2024-03-17 LAB — COMPREHENSIVE METABOLIC PANEL WITH GFR
ALT: 22 U/L (ref 0–44)
AST: 25 U/L (ref 15–41)
Albumin: 3.3 g/dL — ABNORMAL LOW (ref 3.5–5.0)
Alkaline Phosphatase: 72 U/L (ref 38–126)
Anion gap: 10 (ref 5–15)
BUN: 18 mg/dL (ref 8–23)
CO2: 24 mmol/L (ref 22–32)
Calcium: 9.5 mg/dL (ref 8.9–10.3)
Chloride: 102 mmol/L (ref 98–111)
Creatinine, Ser: 0.95 mg/dL (ref 0.44–1.00)
GFR, Estimated: 56 mL/min — ABNORMAL LOW (ref >60)
Glucose, Bld: 130 mg/dL — ABNORMAL HIGH (ref 70–99)
Potassium: 4.3 mmol/L (ref 3.5–5.1)
Sodium: 136 mmol/L (ref 135–145)
Total Bilirubin: 0.7 mg/dL (ref 0.0–1.2)
Total Protein: 6.8 g/dL (ref 6.5–8.1)

## 2024-03-17 LAB — CBC
HCT: 37.4 % (ref 36.0–46.0)
Hemoglobin: 12.1 g/dL (ref 12.0–15.0)
MCH: 28.7 pg (ref 26.0–34.0)
MCHC: 32.4 g/dL (ref 30.0–36.0)
MCV: 88.6 fL (ref 80.0–100.0)
Platelets: 365 K/uL (ref 150–400)
RBC: 4.22 MIL/uL (ref 3.87–5.11)
RDW: 15.4 % (ref 11.5–15.5)
WBC: 23.3 K/uL — ABNORMAL HIGH (ref 4.0–10.5)
nRBC: 0 % (ref 0.0–0.2)

## 2024-03-17 LAB — I-STAT CG4 LACTIC ACID, ED
Lactic Acid, Venous: 1 mmol/L (ref 0.5–1.9)
Lactic Acid, Venous: 0.8 mmol/L (ref 0.5–1.9)

## 2024-03-17 LAB — BRAIN NATRIURETIC PEPTIDE: B Natriuretic Peptide: 219.8 pg/mL — ABNORMAL HIGH (ref 0.0–100.0)

## 2024-03-17 MED ORDER — IPRATROPIUM-ALBUTEROL 0.5-2.5 (3) MG/3ML IN SOLN
3.0000 mL | Freq: Four times a day (QID) | RESPIRATORY_TRACT | Status: DC
  Administered 2024-03-17 – 2024-03-19 (×8): 3 mL via RESPIRATORY_TRACT
  Filled 2024-03-17 (×9): qty 3

## 2024-03-17 MED ORDER — LACTATED RINGERS IV BOLUS (SEPSIS)
1000.0000 mL | Freq: Once | INTRAVENOUS | Status: AC
  Administered 2024-03-17: 1000 mL via INTRAVENOUS

## 2024-03-17 MED ORDER — SODIUM CHLORIDE 0.9 % IV SOLN
2.0000 g | Freq: Once | INTRAVENOUS | Status: AC
  Administered 2024-03-17: 2 g via INTRAVENOUS
  Filled 2024-03-17: qty 20

## 2024-03-17 MED ORDER — SODIUM CHLORIDE 0.9 % IV SOLN
2.0000 g | INTRAVENOUS | Status: DC
  Administered 2024-03-17: 2 g via INTRAVENOUS
  Filled 2024-03-17: qty 12.5

## 2024-03-17 MED ORDER — ONDANSETRON HCL 4 MG/2ML IJ SOLN: 4.0000 mg | Freq: Four times a day (QID) | INTRAMUSCULAR | Status: DC | PRN

## 2024-03-17 MED ORDER — AMLODIPINE BESYLATE 5 MG PO TABS
2.5000 mg | ORAL_TABLET | Freq: Every morning | ORAL | Status: DC
  Administered 2024-03-18 – 2024-03-23 (×6): 2.5 mg via ORAL
  Filled 2024-03-17 (×6): qty 1

## 2024-03-17 MED ORDER — SODIUM CHLORIDE 0.9 % IV SOLN
500.0000 mg | Freq: Once | INTRAVENOUS | Status: AC
  Administered 2024-03-17: 500 mg via INTRAVENOUS
  Filled 2024-03-17: qty 5

## 2024-03-17 MED ORDER — BUDESONIDE 0.25 MG/2ML IN SUSP
0.2500 mg | Freq: Two times a day (BID) | RESPIRATORY_TRACT | Status: DC
  Administered 2024-03-17 – 2024-03-23 (×12): 0.25 mg via RESPIRATORY_TRACT
  Filled 2024-03-17 (×12): qty 2

## 2024-03-17 MED ORDER — ACETAMINOPHEN 325 MG PO TABS
650.0000 mg | ORAL_TABLET | Freq: Four times a day (QID) | ORAL | Status: DC | PRN
  Administered 2024-03-23: 650 mg via ORAL
  Filled 2024-03-17: qty 2

## 2024-03-17 MED ORDER — ENOXAPARIN SODIUM 30 MG/0.3ML IJ SOSY
30.0000 mg | PREFILLED_SYRINGE | INTRAMUSCULAR | Status: DC
Start: 2024-03-17 — End: 2024-03-23
  Administered 2024-03-17 – 2024-03-22 (×6): 30 mg via SUBCUTANEOUS
  Filled 2024-03-17 (×6): qty 0.3

## 2024-03-17 MED ORDER — IOHEXOL 350 MG/ML SOLN
75.0000 mL | Freq: Once | INTRAVENOUS | Status: AC | PRN
  Administered 2024-03-17: 75 mL via INTRAVENOUS

## 2024-03-17 MED ORDER — VITAMIN B-12 1000 MCG PO TABS
500.0000 ug | ORAL_TABLET | Freq: Every day | ORAL | Status: DC
  Administered 2024-03-18 – 2024-03-23 (×6): 500 ug via ORAL
  Filled 2024-03-17 (×7): qty 1

## 2024-03-17 MED ORDER — MIRTAZAPINE 15 MG PO TABS
15.0000 mg | ORAL_TABLET | Freq: Every day | ORAL | Status: DC
  Administered 2024-03-17 – 2024-03-22 (×6): 15 mg via ORAL
  Filled 2024-03-17 (×6): qty 1

## 2024-03-17 MED ORDER — ACETAMINOPHEN 650 MG RE SUPP: 650.0000 mg | Freq: Four times a day (QID) | RECTAL | Status: DC | PRN

## 2024-03-17 MED ORDER — SODIUM CHLORIDE 0.9 % IV SOLN: 2.0000 g | Freq: Two times a day (BID) | INTRAVENOUS | Status: DC

## 2024-03-17 MED ORDER — DEXTROSE IN LACTATED RINGERS 5 % IV SOLN
INTRAVENOUS | Status: DC
Start: 2024-03-17 — End: 2024-03-18

## 2024-03-17 MED ORDER — SODIUM CHLORIDE 0.9 % IV SOLN
3.0000 g | Freq: Two times a day (BID) | INTRAVENOUS | Status: DC
  Administered 2024-03-18 – 2024-03-23 (×10): 3 g via INTRAVENOUS
  Filled 2024-03-17 (×10): qty 8

## 2024-03-17 MED ORDER — LACTATED RINGERS IV SOLN: INTRAVENOUS | Status: DC

## 2024-03-17 MED ORDER — ASPIRIN 81 MG PO CHEW
81.0000 mg | CHEWABLE_TABLET | Freq: Every day | ORAL | Status: DC
  Administered 2024-03-18 – 2024-03-23 (×6): 81 mg via ORAL
  Filled 2024-03-17 (×6): qty 1

## 2024-03-17 MED ORDER — VANCOMYCIN HCL IN DEXTROSE 1-5 GM/200ML-% IV SOLN: 1000.0000 mg | Freq: Once | INTRAVENOUS | Status: DC

## 2024-03-17 MED ORDER — ADULT MULTIVITAMIN W/MINERALS CH
1.0000 | ORAL_TABLET | Freq: Every day | ORAL | Status: DC
  Administered 2024-03-18 – 2024-03-23 (×6): 1 via ORAL
  Filled 2024-03-17 (×6): qty 1

## 2024-03-17 MED ORDER — VANCOMYCIN HCL 1500 MG/300ML IV SOLN: 1500.0000 mg | Freq: Once | INTRAVENOUS | Status: DC

## 2024-03-17 MED ORDER — ACETAMINOPHEN 500 MG PO TABS
500.0000 mg | ORAL_TABLET | Freq: Once | ORAL | Status: AC
  Administered 2024-03-17: 500 mg via ORAL
  Filled 2024-03-17: qty 1

## 2024-03-17 MED ORDER — LACTATED RINGERS IV BOLUS (SEPSIS)
500.0000 mL | Freq: Once | INTRAVENOUS | Status: AC
  Administered 2024-03-17: 500 mL via INTRAVENOUS

## 2024-03-17 MED ORDER — LOSARTAN POTASSIUM 50 MG PO TABS
50.0000 mg | ORAL_TABLET | Freq: Every evening | ORAL | Status: DC
  Administered 2024-03-17 – 2024-03-22 (×6): 50 mg via ORAL
  Filled 2024-03-17 (×6): qty 1

## 2024-03-17 MED ORDER — ONDANSETRON HCL 4 MG PO TABS: 4.0000 mg | ORAL_TABLET | Freq: Four times a day (QID) | ORAL | Status: DC | PRN

## 2024-03-17 MED ORDER — ALBUTEROL SULFATE (2.5 MG/3ML) 0.083% IN NEBU: 2.5000 mg | INHALATION_SOLUTION | RESPIRATORY_TRACT | Status: DC | PRN

## 2024-03-17 MED ADMIN — Methylprednisolone Sod Succ For Inj 125 MG (Base Equiv): 80 mg | INTRAVENOUS | NDC 00009004725

## 2024-03-17 MED FILL — Methylprednisolone Sod Succ For Inj PF 125 MG (Base Equiv): 80.0000 mg | INTRAMUSCULAR | Qty: 2 | Status: AC

## 2024-03-17 NOTE — ED Notes (Signed)
 Extra Red, DG & Red top drawn

## 2024-03-17 NOTE — Assessment & Plan Note (Signed)
Continue with mirtazapine.  Patient was consulted per psychiatry, recommendations for outpatient follow up with behavioral health therapy.

## 2024-03-17 NOTE — ED Provider Notes (Signed)
 [place] EMERGENCY DEPARTMENT AT [facility] Provider Note   CSN: [id number] Arrival date & time: 03/17/24  1317     Patient presents with: DVT   Earleen Aoun is a 89 y.o. female.  Patient is a 89 year old female PMH HTN, asthma, CKD stage IIIb, and LLL lung malignancy is presenting to the ED via POV at the urging of her PCP with concern for DVT/PE.  Patient reports that for the last week she has experienced increased cough with new sputum production as well as subjective fevers x 2 days and worsening dyspnea, particularly with exertion, for the last 3 days.  She reports that for the last 3 weeks she has experienced right calf pain without lower extremity edema/swelling.  She denies any headaches, vision changes, chest pain, nausea, vomiting, abdominal pain, changes in urination, or changes in bowel movements.     Prior to Admission medications   Medication Sig Start Date End Date Taking? Authorizing Provider  albuterol  (VENTOLIN  HFA) 108 (90 Base) MCG/ACT inhaler Inhale 2 puffs into the lungs every 6 (six) hours as needed for wheezing or shortness of breath. 03/15/19   Cheryl Algis SAILOR, MD  amLODipine  (NORVASC ) 10 MG tablet Take 10 mg by mouth daily.    [provider]  ASPIRIN PO Take 1 tablet by mouth daily.    [provider]  budesonide -formoterol  (SYMBICORT ) 160-4.5 MCG/ACT inhaler Inhale 2 puffs into the lungs 2 (two) times daily. Patient taking differently: Inhale 1 puff into the lungs in the morning and at bedtime. 04/13/19   Sood, Vineet, MD  Calcium Carb-Cholecalciferol (CALCIUM-VITAMIN D) 600-400 MG-UNIT TABS Take 1 tablet by mouth daily.    [provider]  Camphor-Menthol-Methyl Sal (SALONPAS) 3.06-06-08 % PTCH Place 1 patch onto the skin daily as needed (to painful site).    [provider]  Cholecalciferol (VITAMIN D) 50 MCG (2000 UT) CAPS Take 2,000 Units by mouth daily.    [provider]  DHA-Vitamin C-Lutein (EYE  HEALTH FORMULA PO) Take 1 capsule by mouth daily.    [provider]  losartan  (COZAAR ) 100 MG tablet Take 100 mg by mouth every evening. 01/21/19   [provider]  magnesium  gluconate (MAGONATE) 500 MG tablet Take 500-1,000 mg by mouth daily.    [provider]  mirtazapine  (REMERON ) 15 MG tablet Take 15 mg by mouth at bedtime. 11/29/18   [provider]  Multiple Vitamin (MULTIVITAMIN) tablet Take 1 tablet by mouth daily.    [provider]  OVER THE COUNTER MEDICATION Take 1 tablet by mouth See admin instructions. Life Extension, Brain Support Supplement tablets- Take 1 tablet by mouth once a day    [provider]  Potassium 99 MG TABS Take 99 mg by mouth daily.    [provider]  Spacer/Aero-Holding Chambers (AEROCHAMBER MV) inhaler Use as instructed 04/05/19   Sood, Vineet, MD  vitamin B-12 (CYANOCOBALAMIN ) 500 MCG tablet Take 500 mcg by mouth daily.    [provider]    Allergies: Fluoxetine    Review of Systems  Updated Vital Signs BP 126/80   Pulse 94   Temp (!) 102.7 F (39.3 C)   Resp 19   Ht 4' 10 (1.473 m)   Wt 50.3 kg   SpO2 95%   BMI 23.20 kg/m   Physical Exam Constitutional:      Appearance: Normal appearance.  HENT:     Head: Normocephalic and atraumatic.     Nose: Nose normal.  Mouth/Throat:     Mouth: Mucous membranes are moist.     Pharynx: Oropharynx is clear.  Eyes:     Pupils: Pupils are equal, round, and reactive to light.  Cardiovascular:     Rate and Rhythm: Regular rhythm. Tachycardia present.     Pulses: Normal pulses.     Heart sounds: Normal heart sounds.  Pulmonary:     Effort: Pulmonary effort is normal.     Comments: Coarse breath sounds diffusely without focal rhonchi Abdominal:     General: Abdomen is flat.     Palpations: Abdomen is soft.  Musculoskeletal:     Cervical back: Normal range of motion.  Skin:    General: Skin is warm and dry.     Capillary  Refill: Capillary refill takes 2 to 3 seconds.  Neurological:     Mental Status: She is alert and oriented to person, place, and time.     (all labs ordered are listed, but only abnormal results are displayed) Labs Reviewed  COMPREHENSIVE METABOLIC PANEL WITH GFR - Abnormal; Notable for the following components:      Result Value   Glucose, Bld 130 (*)    Albumin 3.3 (*)    GFR, Estimated 56 (*)    All other components within normal limits  CBC WITH DIFFERENTIAL/PLATELET - Abnormal; Notable for the following components:   WBC 22.7 (*)    Platelets 439 (*)    Neutro Abs 20.1 (*)    Monocytes Absolute 1.1 (*)    Abs Immature Granulocytes 0.13 (*)    All other components within normal limits  BRAIN NATRIURETIC PEPTIDE - Abnormal; Notable for the following components:   B Natriuretic Peptide 219.8 (*)    All other components within normal limits  RESP PANEL BY RT-PCR (RSV, FLU A&B, COVID)  RVPGX2  CULTURE, BLOOD (ROUTINE X 2)  CULTURE, BLOOD (ROUTINE X 2)  PROTIME-INR  URINALYSIS, W/ REFLEX TO CULTURE (INFECTION SUSPECTED)  I-STAT CG4 LACTIC ACID, ED  I-STAT CG4 LACTIC ACID, ED  TROPONIN I (HIGH SENSITIVITY)  TROPONIN I (HIGH SENSITIVITY)    EKG: None  Radiology: VAS US  LOWER EXTREMITY VENOUS (DVT) (7a-7p) Result Date: 03/17/2024  Lower Venous DVT Study Patient Name:  BRANDA Halleck   Date of Exam:   03/17/2024 Medical Rec #: [id number]  Accession #:    [id number] Date of Birth: 09-09-1929  Patient Gender: F Patient Age:   33 years Exam Location:  [facility] Procedure:      VAS US  LOWER EXTREMITY VENOUS (DVT) Referring Phys: CHRISTIAN PROSPERI --------------------------------------------------------------------------------  Indications: Pain of RLE x18 hours, and SOB.  Comparison Study: No previous RLEV exams. Previous LLEV on 11/09/2023 was                   negative for DVT Performing Technologist: Alberta Lis RVS  Examination Guidelines: A complete evaluation includes  B-mode imaging, spectral Doppler, color Doppler, and power Doppler as needed of all accessible portions of each vessel. Bilateral testing is considered an integral part of a complete examination. Limited examinations for reoccurring indications may be performed as noted. The reflux portion of the exam is performed with the patient in reverse Trendelenburg.  +---------+---------------+---------+-----------+----------+--------------+ RIGHT    CompressibilityPhasicitySpontaneityPropertiesThrombus Aging +---------+---------------+---------+-----------+----------+--------------+ CFV      Full           Yes      Yes                                 +---------+---------------+---------+-----------+----------+--------------+  SFJ      Full                                                        +---------+---------------+---------+-----------+----------+--------------+ FV Prox  Full           Yes      Yes                                 +---------+---------------+---------+-----------+----------+--------------+ FV Mid   Full                                                        +---------+---------------+---------+-----------+----------+--------------+ FV DistalFull                                                        +---------+---------------+---------+-----------+----------+--------------+ PFV      Full                                                        +---------+---------------+---------+-----------+----------+--------------+ POP      Full           Yes      Yes                                 +---------+---------------+---------+-----------+----------+--------------+ PTV      Full                                                        +---------+---------------+---------+-----------+----------+--------------+ PERO     Full                                                         +---------+---------------+---------+-----------+----------+--------------+ ATV      Full                                                        +---------+---------------+---------+-----------+----------+--------------+   +----+---------------+---------+-----------+----------+--------------+ LEFTCompressibilityPhasicitySpontaneityPropertiesThrombus Aging +----+---------------+---------+-----------+----------+--------------+ CFV Full           Yes                                          +----+---------------+---------+-----------+----------+--------------+  Summary: RIGHT: - There is no evidence of deep vein thrombosis in the lower extremity.  - No cystic structure found in the popliteal fossa.  LEFT: - No evidence of common femoral vein obstruction.   *See table(s) above for measurements and observations.    Preliminary    CT Angio Chest PE W and/or Wo Contrast Result Date: 03/17/2024 CLINICAL DATA:  Shortness of breath. Fever. Weakness. History of lung cancer. * Tracking Code: BO * EXAM: CT ANGIOGRAPHY CHEST WITH CONTRAST TECHNIQUE: Multidetector CT imaging of the chest was performed using the standard protocol during bolus administration of intravenous contrast. Multiplanar CT image reconstructions and MIPs were obtained to evaluate the vascular anatomy. RADIATION DOSE REDUCTION: This exam was performed according to the departmental dose-optimization program which includes automated exposure control, adjustment of the mA and/or kV according to patient size and/or use of iterative reconstruction technique. CONTRAST:  75mL OMNIPAQUE  IOHEXOL  350 MG/ML SOLN COMPARISON:  Chest radiograph earlier today. Most recent CT of 06/16/2021. FINDINGS: Cardiovascular: The quality of this exam for evaluation of pulmonary embolism is good. No evidence of pulmonary embolism. Advanced aortic atherosclerosis. Tortuous thoracic aorta. Mild cardiomegaly, without pericardial effusion. Lad coronary artery  calcification. Mediastinum/Nodes: Left-sided thyroid  nodule of 1.7 cm is similar on the prior exam and is of doubtful clinical significance given patient age and comorbidities. Prominent middle mediastinal nodes, none pathologic by size criteria. No hilar adenopathy. Small hiatal hernia. Lungs/Pleura: No pleural fluid. Right lower lobe volume loss and subsegmental atelectasis. Multifocal primarily right-sided pneumonia. Posterior right upper lobe consolidation, relatively mild on 54/6. Multifocal areas of clustered tree-in-bud nodularity including the posterolateral right upper lobe on 46/6 and right lower lobe on 69/6. Relatively similar left lower lobe presumably treatment related scarring. The treated primary is relatively similar in morphology, measuring 3.2 x 2.5 cm on 77/6 versus 3.6 x 2.2 cm on the prior. Minimal right upper lobe nodularity is favored to be due to mucoid impaction on 32/6. Upper Abdomen: Segment 2 12 mm hepatic cyst. Normal imaged portions of the spleen, pancreas, adrenal glands, kidneys. Proximal gastric underdistention. Musculoskeletal: Again identified are multiple left rib fractures. The posterolateral left ninth rib is fragmented and irregular, at the site of a displaced fracture on 06/16/2021. Example image 80/5. Severe T3 compression deformity is similar, without significant ventral canal encroachment. Review of the MIP images confirms the above findings. IMPRESSION: 1. No evidence of pulmonary embolism. 2. Multifocal, significantly right-greater-than-left pneumonia. 3. Relatively similar appearance and size of a treated left lower lobe primary. No typical findings of soft tissue metastasis. 4. Multiple left rib fractures again identified. The ninth posterolateral left rib is fragmented and irregular. Most likely related to incomplete healing and surrounding heterotopic ossification. Osseous metastasis cannot be entirely excluded, but felt less likely given lack of disease elsewhere.  5. Incidental findings, including: Coronary artery atherosclerosis. Aortic Atherosclerosis (ICD10-I70.0). Electronically Signed   By: Rockey Kilts M.D.   On: 03/17/2024 16:43   DG Chest Port 1 View Result Date: 03/17/2024 EXAM: 1 VIEW XRAY OF THE CHEST 03/17/2024 02:57:00 PM COMPARISON: 03/19/2022 CLINICAL HISTORY: Sepsis (HCC) [id number]. Sepsis FINDINGS: LUNGS AND PLEURA: Stable left basilar density is noted consistent with scarring. Mild right mid lung scarring or subsegmental atelectasis is noted. No pulmonary edema. No pleural effusion. No pneumothorax. HEART AND MEDIASTINUM: Stable cardiomediastinal silhouette. BONES AND SOFT TISSUES: Possible destructive lesion seen involving lateral portion of left eighth rib concerning for metastatic disease. IMPRESSION: 1. Possible destructive lesion involving the lateral portion of the left  eighth rib, concerning for metastatic disease. 2. Stable left basilar scarring. 3. Mild right mid lung scarring or subsegmental atelectasis. Electronically signed by: Lynwood Seip MD 03/17/2024 03:41 PM EDT RP Workstation: [id number]     Procedures   Medications Ordered in the ED  lactated ringers  infusion ( Intravenous New Bag/Given 03/17/24 1726)  vancomycin (VANCOCIN) IVPB 1000 mg/200 mL premix (has no administration in time range)  ceFEPIme (MAXIPIME) 2 g in sodium chloride  0.9 % 100 mL IVPB (has no administration in time range)  acetaminophen  (TYLENOL ) tablet 500 mg (has no administration in time range)  lactated ringers  bolus 1,000 mL (1,000 mLs Intravenous New Bag/Given 03/17/24 1622)    And  lactated ringers  bolus 500 mL (0 mLs Intravenous Stopped 03/17/24 1707)  cefTRIAXone  (ROCEPHIN ) 2 g in sodium chloride  0.9 % 100 mL IVPB (0 g Intravenous Stopped 03/17/24 1545)  azithromycin  (ZITHROMAX ) 500 mg in sodium chloride  0.9 % 250 mL IVPB (500 mg Intravenous New Bag/Given 03/17/24 1623)  iohexol  (OMNIPAQUE ) 350 MG/ML injection 75 mL (75 mLs Intravenous Contrast Given  03/17/24 1541)                                    Medical Decision Making Amount and/or Complexity of Data Reviewed Labs: ordered.  Risk OTC drugs. Prescription drug management.   Patient is a 89 year old female with PMH as above presenting to the ED with 1 week of productive cough as well as worsening dyspnea and subjective fevers times several days in the setting of 3 weeks of worsening right calf pain without associated falls or trauma.  Patient sent by PCP with concern for DVT/PE.  Notably, COVID/flu swabs  performed by PCP negative earlier today.  Upon arrival, patient with some conversational dyspnea, tachypnea 23 breaths/min saturating 86% on RA, transitioned to liter [place].  Normotensive.  Sinus tachycardic into the low 100s.  Febrile T102.10F.  GCS 15.  Patient with diffuse coarse breath sounds.  Per triage, patient given crystalloid administration with IV ceftriaxone  and azithromycin , transition to cefepime and vancomycin in the setting of recent antibiotic course.  PSI 162, consistent with 27-29.2% mortality.   CMP without evidence of acute renal impairment/AKI.  eGFR 56.  Neutrophil predominant leukocytosis, WBC 22.7 noted with lactate 1.0.  Lengthy goals of care/CODE STATUS conversation held with patient, as well as patient's son at bedside, and patient's daughter-in-law on the phone.  Patient reports that she would like her CODE STATUS to be transition to DNR/DNI.  CTA PE study without evidence of acute pulmonary embolus, however multifocal right greater than left pneumonia appreciated.  Incidentally, patient with multiple left-sided rib fractures with concern for osseous metastasis of left lower lobe malignancy/nodule.  On reassessment, patient denying any recent falls or trauma.  Hospitalist service engaged in the setting of acute hypoxic respiratory failure with new supplemental oxygen/2 L [place] per minute requirement as well as multifocal right greater than left pneumonia.   Discussed hydrocortisone dosing with hospitalist service, however declined at this time.  Final diagnoses:  Acute hypoxic respiratory failure (HCC)  Pneumonia of both lower lobes due to infectious organism    ED Discharge Orders     None          Reise Gladney, Elsie, MD 03/17/24 1733    Ruthe Cornet, DO 03/18/24 1552

## 2024-03-17 NOTE — ED Notes (Signed)
 2W charge RN notified of pt incoming transfer

## 2024-03-17 NOTE — ED Triage Notes (Signed)
 Pt bib POV accompanied by son c/o DVT in right leg. Pt was seen at her PCP that recommend her to come to ER. Pt endorses sob and back pain. The pt has had pain in leg for about 18 hours.

## 2024-03-17 NOTE — Progress Notes (Signed)
 VASCULAR LAB    Right lower extremity venous duplex has been performed.  See CV proc for preliminary results.   Marialena Wollen, RVT 03/17/2024, 4:39 PM

## 2024-03-17 NOTE — Assessment & Plan Note (Signed)
 Patient sp radiation therapy  Follows once yearly  Will get further records.

## 2024-03-17 NOTE — H&P (Signed)
 History and Physical    Patient: Sandra Cooper FMW:[id number] DOB: Apr 09, 1930 DOA: 03/17/2024 DOS: the patient was seen and examined on 03/17/2024 PCP: Sophronia Ozell BROCKS, MD  Patient coming from: Home  Chief Complaint:  Chief Complaint  Patient presents with   DVT   HPI: Sandra Cooper is a 89 y.o. female with medical history significant of asthma, COPD, hypertension and history of lung cancer,(adenocarcinoma) sp radiation therapy who presented with dyspnea  09/24 patient was diagnosed with pneumonia, treated with antibiotic therapy and steroids.  Follow up chest radiograph 09/30 with resolution of infiltrates.  Completed antibiotic 10/04 and had follow up with primary care on 10/07 and plan to repeat radiograph in 4 weeks.  10/10 Outpatient follow up with primary care, with worsening cough, dyspnea and fatigue, then she was prescribed home physical therapy.  Patient had physical therapy at home 2 days ago, since then she had a rapid and progressive deterioration on her dyspnea. Now having symptoms with minimal efforts, worsening cough with increase sputum production, thick phlegm, positive fever and chills.  Today she went back to her primary care, she was noted to have tachypnea and hypoxemia with 02 saturation 88% with minimal efforts, then she was referred to the ED.      Review of Systems: As mentioned in the history of present illness. All other systems reviewed and are negative. Past Medical History:  Diagnosis Date   Arthritis    Asthma    Hypertension    Lung cancer (HCC)    adenocarcinoma LLL   Past Surgical History:  Procedure Laterality Date   APPENDECTOMY     CT guided lung biopsy     HERNIA REPAIR     I & D EXTREMITY Left 08/26/2020   Procedure: IRRIGATION AND DEBRIDEMENT EXTREMITY;  Surgeon: Lorretta Dess, MD;  Location: [facility];  Service: Plastics;  Laterality: Left;   ORIF WRIST FRACTURE Left 08/26/2020   Procedure: OPEN REDUCTION INTERNAL FIXATION (ORIF) WRIST FRACTURE;   Surgeon: Lorretta Dess, MD;  Location: [facility];  Service: Plastics;  Laterality: Left;   Social History:  reports that she has never smoked. She has never used smokeless tobacco. She reports current alcohol use. She reports that she does not use drugs.  Allergies  Allergen Reactions   Fluoxetine Swelling and Other (See Comments)    Facial swelling and shakiness    Family History  Problem Relation Age of Onset   Cancer Neg Hx     Prior to Admission medications   Medication Sig Start Date End Date Taking? Authorizing Provider  albuterol  (VENTOLIN  HFA) 108 (90 Base) MCG/ACT inhaler Inhale 2 puffs into the lungs every 6 (six) hours as needed for wheezing or shortness of breath. 03/15/19   Cheryl Algis SAILOR, MD  amLODipine  (NORVASC ) 10 MG tablet Take 10 mg by mouth daily.    [provider]  ASPIRIN PO Take 1 tablet by mouth daily.    [provider]  budesonide -formoterol  (SYMBICORT ) 160-4.5 MCG/ACT inhaler Inhale 2 puffs into the lungs 2 (two) times daily. Patient taking differently: Inhale 1 puff into the lungs in the morning and at bedtime. 04/13/19   Sood, Vineet, MD  Calcium Carb-Cholecalciferol (CALCIUM-VITAMIN D) 600-400 MG-UNIT TABS Take 1 tablet by mouth daily.    [provider]  Camphor-Menthol-Methyl Sal (SALONPAS) 3.06-06-08 % PTCH Place 1 patch onto the skin daily as needed (to painful site).    [provider]  Cholecalciferol (VITAMIN D) 50 MCG (2000 UT) CAPS Take 2,000 Units  by mouth daily.    [provider]  DHA-Vitamin C-Lutein (EYE HEALTH FORMULA PO) Take 1 capsule by mouth daily.    [provider]  losartan  (COZAAR ) 100 MG tablet Take 100 mg by mouth every evening. 01/21/19   [provider]  magnesium  gluconate (MAGONATE) 500 MG tablet Take 500-1,000 mg by mouth daily.    [provider]  mirtazapine  (REMERON ) 15 MG tablet Take 15 mg by mouth at bedtime. 11/29/18   [provider]  Multiple  Vitamin (MULTIVITAMIN) tablet Take 1 tablet by mouth daily.    [provider]  OVER THE COUNTER MEDICATION Take 1 tablet by mouth See admin instructions. Life Extension, Brain Support Supplement tablets- Take 1 tablet by mouth once a day    [provider]  Potassium 99 MG TABS Take 99 mg by mouth daily.    [provider]  Spacer/Aero-Holding Chambers (AEROCHAMBER MV) inhaler Use as instructed 04/05/19   Sood, Vineet, MD  vitamin B-12 (CYANOCOBALAMIN ) 500 MCG tablet Take 500 mcg by mouth daily.    [provider]    Physical Exam: Vitals:   03/17/24 1500 03/17/24 1530 03/17/24 1545 03/17/24 1600  BP: 122/85 120/68 110/76 127/82  Pulse: (!) 103 (!) 102 98 96  Resp: 18 20 (!) 22 (!) 28  Temp:      SpO2: 90% 92% 93% 94%  Weight:      Height:       BP 126/80   Pulse 94   Temp 99.7 F (37.6 C) (Oral)   Resp 19   Ht 4' 10 (1.473 m)   Wt 50.3 kg   SpO2 95%   BMI 23.20 kg/m   Neurology awake and alert, deconditioned and ill looking appearing ENT with mild pallor with no icterus Cardiovascular with S1 and S2 present and regular with no gallops, rubs or murmurs Respiratory with prolonged expiratory phase, with bilateral wheezing and rhonchi, positive rales at bases. Noted increased work of breathing and use of neck accessory respiratory muscles Abdomen with no distention, soft and non tender No lower extremity edema   Data Reviewed: co  Na 136, K 4.3 Cl 102 bicarbonate 24 glucose 130, bun 18 cr 0,95  AST 25 ALT 22 BNP 219  High sensitive troponin 17 and 17  Lactic acid 1,0 and 0,8  Wbc 22.7 hgb 14.1 plt 439   Influenza negative RSV negative COVID 19 negative   Chest radiograph with right rotation, left base scarring and right mid lung opacity, at the level of the fissure.  Possible destructive lesion involving the lateral portion of the left eight rib.   CT chest with no pulmonary embolism.  Bilateral ground glass opacities, opacity  at the right mid lung at the level of the fissure. Multifocal tree and bud nodularity,  Left lower lobe scarring  Multiple rib fractures. The ninth posterolateral left rib is fragmented and irregular. Most likely incomplete healing and surrounding heterotopic ossification.   Assessment and Plan: * Right upper lobe pneumonia Noted tree an bud changes on CT possible aspiration pneumonia.  Complicated with sepsis, tachycardia and leukocytosis Acute hypoxemic respiratory failure.   Plan to continue antibiotic therapy with Unasyn.  Gently hydration with balanced electrolyte solutions with dextrose  at 75 ml per hr Follow up culture and temperature curve Check swallow evaluation  Aspiration precautions.   COPD (chronic obstructive pulmonary disease) (HCC) Acute exacerbation  Plan to continue bronchodilator therapy with albuterol / ipratropium Inhaled and systemic corticosteroids Airway clearing techniques with  flutter valve and incentive spirometer.  Continue supplemental 02 per [place] to keep -02 saturation 92% or greater.   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   Depression Continue with mirtazapine        Advance Care Planning:   Code Status: Full Code   Consults: none   Family Communication: I spoke with patient's son at the bedside, we talked in detail about patient's condition, plan of care and prognosis and all questions were addressed.   Severity of Illness: The appropriate patient status for this patient is INPATIENT. Inpatient status is judged to be reasonable and necessary in order to provide the required intensity of service to ensure the patient's safety. The patient's presenting symptoms, physical exam findings, and initial radiographic and laboratory data in the context of their chronic  comorbidities is felt to place them at high risk for further clinical deterioration. Furthermore, it is not anticipated that the patient will be medically stable for discharge from the hospital within 2 midnights of admission.   * I certify that at the point of admission it is my clinical judgment that the patient will require inpatient hospital care spanning beyond 2 midnights from the point of admission due to high intensity of service, high risk for further deterioration and high frequency of surveillance required.*  Author: Elidia Toribio Furnace, MD 03/17/2024 5:30 PM  For on call review [email or web address].

## 2024-03-17 NOTE — Progress Notes (Signed)
 Subjective   Patient ID:  Sandra Cooper is a 89 y.o. (DOB 25-Jul-1929) female.      Patient presents with  . Fever    Recoverring from pneumonia early october Now febrile, tachyonic and hypoxic Reports leg and back pain Fever to 101 last night with chills   Short Social History[1] No family history on file. Past Medical History:  Diagnosis Date  . Arm fracture   . Arthritis   . Asthma (*)   . Hypertension   . Osteoporosis   . 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 Rib fracture    left  . Thoracic back pain    Review of Systems is complete and negative except as noted.  Objective   BP 134/80 (BP Location: Left Upper Arm, Patient Position: Sitting)   Pulse 101   Temp 99.5 F (37.5 C) (Oral)   Resp 14   Ht 5' (1.524 m)   Wt 114 lb (51.7 kg)   SpO2 92%   BMI 22.26 kg/m  General:  Well developed, Well nourished. tachypnic HEENT: Normocephalic, atraumatic; Pupils equal, round, reactive to light and accommodation; Conjunctiva normal; Bilateral external auditory canals and tympanic membranes normal; Nares normal; Oropharynx moist and clear Neck:  Supple No lymphadenopathy. No bruit CV:  tachycardic Lungs:  Clear to auscultation bilaterally. tachypnic Skin:  No focal rashes  Extremities: no edema. Homans sign RLE Neuro:  No focal deficits; Cranial nerves II-XII intact     Impression   1. Shortness of breath   2. Fever, unspecified fever cause     Plan  O2 sat has been 94% 88-92% in office and tachypnic with conversation Concern is fior recurrent pneumonia or DVT/PE Covid/flu negative Son is to take emergently to ED for further evaluation    Patient's Medications       * Accurate as of March 17, 2024 12:44 PM. Reflects encounter med changes as of last refresh          Continued Medications      Instructions  acetaminophen  500 mg tablet Commonly known as: TYLENOL   1,000 mg, Every 6 hours as needed   AIRIAL COMPACT MINI  NEBULIZER Misc  See admin instructions   albuterol  sulfate HFA 108 (90 Base) MCG/ACT inhaler Commonly known as: PROVENTIL ,VENTOLIN ,PROAIR   INHALE 1 PUFF INTO THE LUNGS EVERY 6 HOURS AS NEEDED FOR WHEEZING OR SHORTNESS OF BREATH   * amLODIPine  besylate 2.5 mg tablet Commonly known as: NORVASC   2.5 mg, Oral, Daily   * amLODIPine  besylate 5 mg tablet Commonly known as: NORVASC   5 mg, Oral, Daily   amoxicillin 500 mg capsule Commonly known as: AMOXIL  500 mg, Every 8 hours   amoxicillin-clavulanate 875-125 mg per tablet Commonly known as: AUGMENTIN  1 tablet, 2 times a day   aspirin 81 mg chewable tablet  81 mg, Daily   atorvastatin 20 mg tablet Commonly known as: LIPITOR  20 mg   azelastine 0.1 % nasal spray Commonly known as: ASTELIN  1 spray, Nasal, 2 times a day   azithromycin  250 mg tablet Commonly known as: ZITHROMAX   250 mg, Daily   budesonide  0.5 mg/2 mL nebulizer solution Commonly known as: PULMICORT   SMARTSIG:2 Milliliter(s) Via Nebulizer Twice Daily   famotidine 40 mg tablet Commonly known as: PEPCID  40 mg, At bedtime   GAS RELIEF PO  Daily as needed   ibuprofen  200 mg tablet Commonly known as: ADVIL ,MOTRIN   200 mg, Every 6 hours as  needed   losartan  potassium 100 mg tablet Commonly known as: COZAAR   100 mg, Oral, Daily   meloxicam  15 mg tablet Commonly known as: MOBIC   15 mg, Daily   mirtazapine  15 mg tablet Commonly known as: REMERON   15 mg, Oral, At bedtime   omega-3 1,000 mg capsule  2 g, Daily   pantoprazole sodium 40 mg tablet Commonly known as: PROTONIX  40 mg, Oral, Daily   polyethylene glycol 17 GM/SCOOP powder Commonly known as: MIRALAX,GAVILAX,CLEARLAX  17 g, Daily   Potassium 99 MG Tabs  1 tablet, Daily   PROBIOTIC PO  Take by mouth.   TRELEGY ELLIPTA 200-62.5-25 MCG/ACT Aepb inhaler Generic drug: fluticasone-umeclidin-vilant  1 puff, Inhalation, Daily   UNABLE TO FIND  1 tablet, Daily   valacyclovir 1000 mg  tablet Commonly known as: VALTREX  1,000 mg, Oral, 3 times a day   Vitamin B-12 500 MCG Subl  500 mcg, Daily   vitamin D 50 mcg (2000 UT) tablet  1,000 Units, Daily   zoledronic acid 5 mg/100 mL Soln Commonly known as: RECLAST  5 mg, Yearly      * * This list has 2 medication(s) that are the same as other medications prescribed for you. Read the directions carefully, and ask your doctor or other care provider to review them with you.           Orders Placed This Encounter  Procedures  . Culture, Urine Urine  . POCT urinalysis dipstick  . POCT CoVid-19 nucleic acid  . POCT Flu A+B (2 Results)    Risks, benefits, and alternatives of the medications and treatment plan prescribed today were discussed, and patient expressed understanding. Plan follow-up as discussed or as needed if any worsening symptoms or change in condition.             [1] Social History Tobacco Use  . Smoking status: Never    Passive exposure: Never  . Smokeless tobacco: Never  Vaping Use  . Vaping status: Never Used  Substance Use Topics  . Alcohol use: No  . Drug use: No  *Some images could not be shown.

## 2024-03-17 NOTE — Sepsis Progress Note (Signed)
 Sepsis protocol monitored by eLink

## 2024-03-17 NOTE — Assessment & Plan Note (Signed)
 Acute exacerbation  Plan to continue bronchodilator therapy with albuterol / ipratropium Inhaled and systemic corticosteroids Airway clearing techniques with flutter valve and incentive spirometer.  Continue supplemental 02 per [place] to keep -02 saturation 92% or greater.  Today 02 saturation is 96% on 2 L/min per [place]

## 2024-03-17 NOTE — ED Notes (Signed)
 Patient is being brought back to ER treatment room now from lobby.

## 2024-03-17 NOTE — Assessment & Plan Note (Signed)
 Continue blood pressure control with amlodipine , losartan 

## 2024-03-17 NOTE — Assessment & Plan Note (Addendum)
 Noted tree and bud changes on CT possible aspiration pneumonia.  Complicated with sepsis, tachycardia and leukocytosis (sepsis has resolved)  Acute hypoxemic respiratory failure.   Wbc is 21.2, cultures positive fro gram positive cocci staphylococcus species, only in one bottle.  Patient has been afebrile.   Antibiotic therapy with Unasyn.  Swallow evaluation performed with recommendations for dysphagia diet 3  Discontinue telemetry.

## 2024-03-17 NOTE — ED Notes (Signed)
Assisted pt w/ bedpan.

## 2024-03-17 NOTE — ED Provider Triage Note (Signed)
 Emergency Medicine Provider Triage Evaluation Note  Sandra Cooper , a 89 y.o. female  was evaluated in triage.  Pt complains of right leg pain, shortness of breath, fever, weakness, hypoxia.  Recently treated for pneumonia, history of lung cancer.  No previous history of blood clots, not taking blood thinner.  On arrival fever 1-2.7, shortness of breath, back pain, tachycardia, pulse 105.  Endorses some right lower leg pain.  Sent by PCP to rule out infection/pulmonary embolus..  Review of Systems  Positive: Shob, leg pain, fever Negative:   Physical Exam  BP (!) 153/79   Pulse (!) 105   Temp (!) 102.7 F (39.3 C)   Resp 18   Ht 4' 10 (1.473 m)   Wt 50.3 kg   SpO2 90%   BMI 23.20 kg/m  Gen:   Awake, no distress   Resp:  Normal effort  MSK:   Moves extremities without difficulty  Other:  Some mild wheezing, coarse rhonchi, increased work of breathing  Medical Decision Making  Medically screening exam initiated at 2:00 PM.  Appropriate orders placed.  Sandra Cooper was informed that the remainder of the evaluation will be completed by another provider, this initial triage assessment does not replace that evaluation, and the importance of remaining in the ED until their evaluation is complete.  Workup initiated in triage  Code sepsis activation   Sandra Sherlean DEL, PA-C 03/17/24 1400

## 2024-03-18 DIAGNOSIS — J441 Chronic obstructive pulmonary disease with (acute) exacerbation: Secondary | ICD-10-CM | POA: Diagnosis not present

## 2024-03-18 DIAGNOSIS — I10 Essential (primary) hypertension: Secondary | ICD-10-CM | POA: Diagnosis not present

## 2024-03-18 DIAGNOSIS — R739 Hyperglycemia, unspecified: Secondary | ICD-10-CM

## 2024-03-18 DIAGNOSIS — C3432 Malignant neoplasm of lower lobe, left bronchus or lung: Secondary | ICD-10-CM | POA: Diagnosis not present

## 2024-03-18 DIAGNOSIS — J189 Pneumonia, unspecified organism: Secondary | ICD-10-CM | POA: Diagnosis not present

## 2024-03-18 LAB — BASIC METABOLIC PANEL WITH GFR
Anion gap: 11 (ref 5–15)
BUN: 18 mg/dL (ref 8–23)
CO2: 24 mmol/L (ref 22–32)
Calcium: 9 mg/dL (ref 8.9–10.3)
Chloride: 104 mmol/L (ref 98–111)
Creatinine, Ser: 1.07 mg/dL — ABNORMAL HIGH (ref 0.44–1.00)
GFR, Estimated: 49 mL/min — ABNORMAL LOW (ref >60)
Glucose, Bld: 336 mg/dL — ABNORMAL HIGH (ref 70–99)
Potassium: 3.7 mmol/L (ref 3.5–5.1)
Sodium: 139 mmol/L (ref 135–145)

## 2024-03-18 LAB — CBC
HCT: 37.1 % (ref 36.0–46.0)
Hemoglobin: 12.1 g/dL (ref 12.0–15.0)
MCH: 29.4 pg (ref 26.0–34.0)
MCHC: 32.6 g/dL (ref 30.0–36.0)
MCV: 90 fL (ref 80.0–100.0)
Platelets: 382 K/uL (ref 150–400)
RBC: 4.12 MIL/uL (ref 3.87–5.11)
RDW: 15.3 % (ref 11.5–15.5)
WBC: 22.3 K/uL — ABNORMAL HIGH (ref 4.0–10.5)
nRBC: 0 % (ref 0.0–0.2)

## 2024-03-18 LAB — BLOOD CULTURE ID PANEL (REFLEXED) - BCID2

## 2024-03-18 LAB — HEMOGLOBIN A1C
Hgb A1c MFr Bld: 5.8 % — ABNORMAL HIGH (ref 4.8–5.6)
Mean Plasma Glucose: 119.76 mg/dL

## 2024-03-18 LAB — GLUCOSE, CAPILLARY: Glucose-Capillary: 202 mg/dL — ABNORMAL HIGH (ref 70–99)

## 2024-03-18 MED ORDER — INSULIN ASPART 100 UNIT/ML IJ SOLN
0.0000 [IU] | Freq: Three times a day (TID) | INTRAMUSCULAR | Status: DC
  Administered 2024-03-19: 2 [IU] via SUBCUTANEOUS
  Administered 2024-03-19: 3 [IU] via SUBCUTANEOUS
  Administered 2024-03-19 – 2024-03-20 (×3): 1 [IU] via SUBCUTANEOUS
  Administered 2024-03-20 – 2024-03-21 (×2): 2 [IU] via SUBCUTANEOUS
  Administered 2024-03-21 (×2): 1 [IU] via SUBCUTANEOUS
  Administered 2024-03-22: 2 [IU] via SUBCUTANEOUS
  Administered 2024-03-22 (×2): 1 [IU] via SUBCUTANEOUS

## 2024-03-18 MED ORDER — SIMETHICONE 80 MG PO CHEW
80.0000 mg | CHEWABLE_TABLET | Freq: Four times a day (QID) | ORAL | Status: DC | PRN
  Administered 2024-03-18 – 2024-03-23 (×14): 80 mg via ORAL
  Filled 2024-03-18 (×14): qty 1

## 2024-03-18 MED ORDER — METHYLPREDNISOLONE SODIUM SUCC 40 MG IJ SOLR
40.0000 mg | Freq: Two times a day (BID) | INTRAMUSCULAR | Status: DC
  Administered 2024-03-19 – 2024-03-23 (×9): 40 mg via INTRAVENOUS
  Filled 2024-03-18 (×9): qty 1

## 2024-03-18 MED ADMIN — Methylprednisolone Sod Succ For Inj 125 MG (Base Equiv): 80 mg | INTRAVENOUS | NDC 00009004725

## 2024-03-18 NOTE — Hospital Course (Signed)
 Sandra Cooper was admitted to the hospital with the working diagnosis of COPD exacerbation, possible aspiration pneumonia   89 y.o. female with medical history significant of asthma, COPD, hypertension and history of lung cancer,(adenocarcinoma) sp radiation therapy who presented with dyspnea   09/24 patient was diagnosed with pneumonia, treated with antibiotic therapy and steroids.  Follow up chest radiograph 09/30 with resolution of infiltrates.  Completed antibiotic 10/04 and had follow up with primary care on 10/07 and plan to repeat radiograph in 4 weeks.  10/10 Outpatient follow up with primary care, with worsening cough, dyspnea and fatigue, then she was prescribed home physical therapy.   Patient had physical therapy at home 2 days ago, since then she had a rapid and progressive deterioration on her dyspnea Having symptoms with minimal efforts, worsening cough with increase sputum production, thick phlegm, positive fever and chills.  On the day of admission she was evaluated by primary care, she was noted to have tachypnea and hypoxemia with 02 saturation 88% with minimal efforts, then she was referred to the ED.  On her initial physical examination her blood pressure was 122/85, HR 102, RR 28 and 02 saturation 90% Lungs with prolonged expiratory phase and bilateral wheezing and rhonchi, scattered rales, heart with S1 and S2 present and regular, abdomen with no distention and no lower extremity edema.   Na 136, K 4.3 Cl 102 bicarbonate 24 glucose 130, bun 18 cr 0,95  AST 25 ALT 22 BNP 219  High sensitive troponin 17 and 17  Lactic acid 1,0 and 0,8  Wbc 22.7 hgb 14.1 plt 439    Influenza negative RSV negative COVID 19 negative    Chest radiograph with right rotation, left base scarring and right mid lung opacity, at the level of the fissure.  Possible destructive lesion involving the lateral portion of the left eight rib.    CT chest with no pulmonary embolism.  Bilateral ground glass  opacities, opacity at the right mid lung at the level of the fissure. Multifocal tree and bud nodularity,  Left lower lobe scarring  Multiple rib fractures. The ninth posterolateral left rib is fragmented and irregular. Most likely incomplete healing and surrounding heterotopic ossification.   Patient was placed on IV antibiotic therapy, bronchodilator and corticosteroids.  Dysphagia 3 diet per speech recommendations.   10/19 clinically continue to improve, encourage mobility. Will need 24 hrs care at home.  10/20 clinically improving, patient will need SNF

## 2024-03-18 NOTE — Assessment & Plan Note (Signed)
 Insulin sliding scale for glucose cover and monitoring  Fasting glucose this am 171 mg/dl  Hgb J8r 5.8 consistent with pre diabetes.

## 2024-03-18 NOTE — Progress Notes (Signed)
 PHARMACY - PHYSICIAN COMMUNICATION CRITICAL VALUE ALERT - BLOOD CULTURE IDENTIFICATION (BCID)  Sandra Cooper is an 89 y.o. female who presented to [facility] on 03/17/2024 with a chief complaint of COPD / Pneumonia    Name of physician (or Provider) Contacted: Dr Noralee   Current antibiotics: Unasyn   Changes to prescribed antibiotics recommended:  Patient is on recommended antibiotics - No changes needed  Results for orders placed or performed during the hospital encounter of 03/17/24  Blood Culture ID Panel (Reflexed) (Collected: 03/17/2024  2:50 PM)  Result Value Ref Range   Enterococcus faecalis NOT DETECTED NOT DETECTED   Enterococcus Faecium NOT DETECTED NOT DETECTED   Listeria monocytogenes NOT DETECTED NOT DETECTED   Staphylococcus species DETECTED (A) NOT DETECTED   Staphylococcus aureus (BCID) NOT DETECTED NOT DETECTED   Staphylococcus epidermidis NOT DETECTED NOT DETECTED   Staphylococcus lugdunensis NOT DETECTED NOT DETECTED   Streptococcus species NOT DETECTED NOT DETECTED   Streptococcus agalactiae NOT DETECTED NOT DETECTED   Streptococcus pneumoniae NOT DETECTED NOT DETECTED   Streptococcus pyogenes NOT DETECTED NOT DETECTED   A.calcoaceticus-baumannii NOT DETECTED NOT DETECTED   Bacteroides fragilis NOT DETECTED NOT DETECTED   Enterobacterales NOT DETECTED NOT DETECTED   Enterobacter cloacae complex NOT DETECTED NOT DETECTED   Escherichia coli NOT DETECTED NOT DETECTED   Klebsiella aerogenes NOT DETECTED NOT DETECTED   Klebsiella oxytoca NOT DETECTED NOT DETECTED   Klebsiella pneumoniae NOT DETECTED NOT DETECTED   Proteus species NOT DETECTED NOT DETECTED   Salmonella species NOT DETECTED NOT DETECTED   Serratia marcescens NOT DETECTED NOT DETECTED   Haemophilus influenzae NOT DETECTED NOT DETECTED   Neisseria meningitidis NOT DETECTED NOT DETECTED   Pseudomonas aeruginosa NOT DETECTED NOT DETECTED   Stenotrophomonas maltophilia NOT DETECTED NOT DETECTED    Candida albicans NOT DETECTED NOT DETECTED   Candida auris NOT DETECTED NOT DETECTED   Candida glabrata NOT DETECTED NOT DETECTED   Candida krusei NOT DETECTED NOT DETECTED   Candida parapsilosis NOT DETECTED NOT DETECTED   Candida tropicalis NOT DETECTED NOT DETECTED   Cryptococcus neoformans/gattii NOT DETECTED NOT DETECTED    Olam Chalk Pharm.D. CPP, BCPS Clinical Pharmacist [phone] 03/18/2024 6:15 PM

## 2024-03-18 NOTE — Evaluation (Signed)
 Physical Therapy Evaluation Patient Details Name: Sandra Cooper MRN: [id number] DOB: 12/19/1929 Today's Date: 03/18/2024  History of Present Illness  89 y.o. female presenting to the ED 10/17 from primary care appointment she presented with tachypnea and hypoxemia with 02 saturation 88% with minimal efforts, she had a rapid and progressive deterioration on her dyspnea three days ago at PT appointment. Continues to have right upper lobe PNA from 9/24 diagnosis, PMH: medical history significant of asthma, COPD, hypertension and history of lung cancer,(adenocarcinoma) sp radiation therapy  Clinical Impression  PTA pt living alone (son spends night 3-5 nights a week) in single story home with steps to enter. Pt reports independence with mobility using her Rollator and independence with ADLs and iADLs, and still driving. Pt reports it might be time to give up driving and get some help with her iADLs. Pt is currently limited in safe mobility by decreased strength, balance and endurance, in presence of increased O2 demand (needing 2L O2 via [place]) with ambulation. Pt is mod I for bed mobility, supervision for transfers and contact guard for short distance ambulation. Pt will likely do best with return to her home environment with 24 hour care and HHPT. PT will continue to follow acutely and will refer to Mobility Specialist.         If plan is discharge home, recommend the following: A little help with bathing/dressing/bathroom;Assistance with cooking/housework;Direct supervision/assist for medications management;Direct supervision/assist for financial management;Assist for transportation;Help with stairs or ramp for entrance   Can travel by private vehicle    Yes    Equipment Recommendations None recommended by PT     Functional Status Assessment Patient has had a recent decline in their functional status and demonstrates the ability to make significant improvements in function in a reasonable and predictable  amount of time.     Precautions / Restrictions Precautions Precautions: Fall Restrictions Weight Bearing Restrictions Per Provider Order: No      Mobility  Bed Mobility Overal bed mobility: Modified Independent             General bed mobility comments: HoB elevated and use of bedrails but no physical assist required    Transfers Overall transfer level: Needs assistance   Transfers: Sit to/from Stand Sit to Stand: Supervision           General transfer comment: good power up and self steadying in RW    Ambulation/Gait Ambulation/Gait assistance: Contact guard assist Gait Distance (Feet): 50 Feet Assistive device: Rolling walker (2 wheels) Gait Pattern/deviations: Step-through pattern, Decreased step length - right, Decreased step length - left, Shuffle, Trunk flexed Gait velocity: slowed Gait velocity interpretation: <1.31 ft/sec, indicative of household ambulator   General Gait Details: contact guard for safety, slowed shuffling gait, multiple standing rest breaks and appears frustrated that he can not walk further         Balance Overall balance assessment: Needs assistance Sitting-balance support: Feet supported, No upper extremity supported Sitting balance-Leahy Scale: Good     Standing balance support: During functional activity, Reliant on assistive device for balance, Single extremity supported, Bilateral upper extremity supported Standing balance-Leahy Scale: Poor Standing balance comment: requires UE support for dynamic balance                             Pertinent Vitals/Pain Pain Assessment Pain Assessment: No/denies pain    Home Living Family/patient expects to be discharged to:: Private residence Living Arrangements: Alone;Other (Comment)  Available Help at Discharge: Family;Available PRN/intermittently (son) Type of Home: House Home Access: Stairs to enter   [employer] of Steps: 3   Home Layout: One  level Home Equipment: Cane - single point;Shower seat;Rollator (4 wheels);Grab bars - tub/shower;Toilet riser Additional Comments: uses Rollator    Prior Function Prior Level of Function : Independent/Modified Independent             Mobility Comments: reports she still drives but thinks she needs to give it up things make her more tired ADLs Comments: reports independence but states that she may need more help with housework     Extremity/Trunk Assessment        Lower Extremity Assessment Lower Extremity Assessment: Generalized weakness;Overall [name] for tasks assessed    Cervical / Trunk Assessment Cervical / Trunk Assessment: Kyphotic  Communication   Communication Communication: No apparent difficulties;Impaired Factors Affecting Communication: Reduced clarity of speech (raspy voice)    Cognition Arousal: Alert Behavior During Therapy: WFL for tasks assessed/performed   PT - Cognitive impairments: No apparent impairments, No family/caregiver present to determine baseline                         Following commands: Intact       Cueing Cueing Techniques: Verbal cues, Tactile cues, Visual cues     General Comments General comments (skin integrity, edema, etc.): SpO2 on 2L O2 via [place] 95%O2 at rest 89%O2 with ambulation, HR in105bpm with ambulation        Assessment/Plan    PT Assessment Patient needs continued PT services  PT Problem List Decreased strength;Decreased activity tolerance;Decreased balance;Decreased mobility;Cardiopulmonary status limiting activity       PT Treatment Interventions DME instruction;Gait training;Stair training;Functional mobility training;Therapeutic activities;Therapeutic exercise;Balance training;Cognitive remediation;Patient/family education    PT Goals (Current goals can be found in the Care Plan section)  Acute Rehab PT Goals PT Goal Formulation: With patient Time For Goal Achievement: 04/01/24 Potential to  Achieve Goals: Fair    Frequency Min 2X/week        AM-PAC PT 6 Clicks Mobility  Outcome Measure Help needed turning from your back to your side while in a flat bed without using bedrails?: None Help needed moving from lying on your back to sitting on the side of a flat bed without using bedrails?: None Help needed moving to and from a bed to a chair (including a wheelchair)?: None Help needed standing up from a chair using your arms (e.g., wheelchair or bedside chair)?: None Help needed to walk in Cooper room?: A Little Help needed climbing 3-5 steps with a railing? : A Lot 6 Click Score: 21    End of Session Equipment Utilized During Treatment: Gait belt;Oxygen Activity Tolerance: Patient tolerated treatment well Patient left: in bed;with call bell/phone within reach;with bed alarm set Nurse Communication: Mobility status PT Visit Diagnosis: Unsteadiness on feet (R26.81);Muscle weakness (generalized) (M62.81);Difficulty in walking, not elsewhere classified (R26.2)    Time: 8346-8280 PT Time Calculation (min) (ACUTE ONLY): 26 min   Charges:   PT Evaluation $PT Eval Moderate Complexity: 1 Mod PT Treatments $Therapeutic Exercise: 8-22 mins PT General Charges $$ ACUTE PT VISIT: 1 Visit         Sandra Cooper PT, DPT Acute Rehabilitation Services Please use secure chat or  Call Office [phone]   Sandra Cooper 03/18/2024, 5:32 PM

## 2024-03-18 NOTE — Evaluation (Signed)
 Clinical/Bedside Swallow Evaluation Patient Details  Name: Cree Napoli MRN: [id number] Date of Birth: 07-Feb-1930  Today's Date: 03/18/2024 Time: SLP Start Time (ACUTE ONLY): 1031 SLP Stop Time (ACUTE ONLY): 1047 SLP Time Calculation (min) (ACUTE ONLY): 16 min  Past Medical History:  Past Medical History:  Diagnosis Date   Arthritis    Asthma    Hypertension    Lung cancer (HCC)    adenocarcinoma [facility]   Past Surgical History:  Past Surgical History:  Procedure Laterality Date   APPENDECTOMY     CT guided lung biopsy     HERNIA REPAIR     I & D EXTREMITY Left 08/26/2020   Procedure: IRRIGATION AND DEBRIDEMENT EXTREMITY;  Surgeon: Lorretta Dess, MD;  Location: [facility];  Service: Plastics;  Laterality: Left;   ORIF WRIST FRACTURE Left 08/26/2020   Procedure: OPEN REDUCTION INTERNAL FIXATION (ORIF) WRIST FRACTURE;  Surgeon: Lorretta Dess, MD;  Location: [facility];  Service: Plastics;  Laterality: Left;   HPI:  Patient is a 89 y.o. female presenting to the ED from primary care appointment she presented with tachypnea and hypoxemia with 02 saturation 88% with minimal efforts, she had a rapid and progressive deterioration on her dyspnea three days ago at PT appointment. Now having symptoms with minimal efforts, worsening cough with increase sputum production, thick phlegm, positive fever and chills. On 09/24 patient was diagnosed with pneumonia, treated with antibiotic therapy and steroids- follow up chest radiograph 09/30 with resolution of infiltrates. Previous esophogram barium from May of 2023 indicated Mild intermittent esophageal dysmotility with tertiary contractions PMH: medical history significant of asthma, COPD, hypertension and history of lung cancer,(adenocarcinoma) sp radiation therapy who presented with dyspnea    Assessment / Plan / Recommendation  Clinical Impression   Pt seen for skilled ST evaluation of swallowing abilities. Pt was encountered eating breakfast, OME was [facility]. Pt  consumed thin liquid and regular solids with no overt s/s of aspiration. Pt did have moderate oral reside when masticating and difficulty forming a bolus, pt reports xerostomia contributing to this and that following with liquids clears the bolus. Her voice was hoarse, pt and pt's son Mancel were educated on vocal cord inflammation from chronic coughing. SLP also discussed changing diet texture to soft and bite sized and adding sauce to decrease difficulty with bolus formation- pt agreeable to do so. Pt also displayed great recall of her esophageal needs and effective compensatory strategies. Diet recs are dys 3/thin with consistent oral care and general safe swallow precautions and GERD precautions. Given fragile pulmonary status and recent PNA dx, SLP to f/u again to ensure anew diet recs are tolerated well and carryover of compensatory strategies.    SLP Visit Diagnosis: Dysphagia, pharyngoesophageal phase (R13.14)    Aspiration Risk  No limitations    Diet Recommendation Dysphagia 3 (Mech soft);Thin liquid    Liquid Administration via: Cup;Straw Medication Administration: Whole meds with liquid Supervision: Patient able to self feed Compensations: Minimize environmental distractions;Slow rate;Small sips/bites;Follow solids with liquid Postural Changes: Seated upright at 90 degrees;Remain upright for at least 30 minutes after po intake    Other  Recommendations Oral Care Recommendations: Oral care BID     Assistance Recommended at Discharge    Functional Status Assessment Patient has had a recent decline in their functional status and demonstrates the ability to make significant improvements in function in a reasonable and predictable amount of time.  Frequency and Duration min 1 x/week  1 week  Prognosis Prognosis for improved oropharyngeal function: Good      Swallow Study   General Date of Onset: 03/17/24 HPI: Patient is a 89 y.o. female presenting to the ED from primary care  appointment she presented with tachypnea and hypoxemia with 02 saturation 88% with minimal efforts, she had a rapid and progressive deterioration on her dyspnea three days ago at PT appointment. Now having symptoms with minimal efforts, worsening cough with increase sputum production, thick phlegm, positive fever and chills. On 09/24 patient was diagnosed with pneumonia, treated with antibiotic therapy and steroids- follow up chest radiograph 09/30 with resolution of infiltrates. Previous esophogram barium from May of 2023 indicated Mild intermittent esophageal dysmotility with tertiary contractions PMH: medical history significant of asthma, COPD, hypertension and history of lung cancer,(adenocarcinoma) sp radiation therapy who presented with dyspnea Type of Study: Bedside Swallow Evaluation Previous Swallow Assessment: BSE in October '23 (No concerns) Diet Prior to this Study: Regular;Thin liquids (Level 0) Respiratory Status: Room air History of Recent Intubation: No Behavior/Cognition: Alert;Pleasant mood;Cooperative Oral Cavity Assessment: Within Functional Limits Oral Care Completed by SLP: No Oral Cavity - Dentition: Other (Comment);Adequate natural dentition (Some missing teeth, overall WFL) Vision: Functional for self-feeding Self-Feeding Abilities: Able to feed self Patient Positioning: Upright in bed Baseline Vocal Quality: Hoarse;Low vocal intensity;Other (comment) (Pt and pt's son reports a weak voice- likely due to frequent coughing) Volitional Cough: Other (Comment) (Did not personally witness, the pt reports she has been coughing strongly for weeks though) Volitional Swallow: Able to elicit    Oral/Motor/Sensory Function Overall Oral Motor/Sensory Function: Within functional limits   Ice Chips Ice chips: Not tested   Thin Liquid Thin Liquid: Within functional limits    Nectar Thick Nectar Thick Liquid: Not tested   Honey Thick Honey Thick Liquid: Not tested   Puree Puree: Not  tested   Solid     Solid: Within functional limits Presentation: Self Fed      Manuelita Blew M.S. CCC-SLP

## 2024-03-18 NOTE — Progress Notes (Addendum)
 Progress Note   Patient: Sandra Cooper FMW:[id number] DOB: [birth year] DOA: 03/17/2024     1 DOS: the patient was seen and examined on 03/18/2024   Brief hospital course: Sandra Cooper was admitted to the hospital with the working diagnosis of COPD exacerbation, possible aspiration pneumonia   89 y.o. female with medical history significant of asthma, COPD, hypertension and history of lung cancer,(adenocarcinoma) sp radiation therapy who presented with dyspnea   09/24 patient was diagnosed with pneumonia, treated with antibiotic therapy and steroids.  Follow up chest radiograph 09/30 with resolution of infiltrates.  Completed antibiotic 10/04 and had follow up with primary care on 10/07 and plan to repeat radiograph in 4 weeks.  10/10 Outpatient follow up with primary care, with worsening cough, dyspnea and fatigue, then she was prescribed home physical therapy.   Patient had physical therapy at home 2 days ago, since then she had a rapid and progressive deterioration on her dyspnea Having symptoms with minimal efforts, worsening cough with increase sputum production, thick phlegm, positive fever and chills.  On the day of admission she was evaluated by primary care, she was noted to have tachypnea and hypoxemia with 02 saturation 88% with minimal efforts, then she was referred to the ED.  On her initial physical examination her blood pressure was 122/85, HR 102, RR 28 and 02 saturation 90% Lungs with prolonged expiratory phase and bilateral wheezing and rhonchi, scattered rales, heart with S1 and S2 present and regular, abdomen with no distention and no lower extremity edema.   Na 136, K 4.3 Cl 102 bicarbonate 24 glucose 130, bun 18 cr 0,95  AST 25 ALT 22 BNP 219  High sensitive troponin 17 and 17  Lactic acid 1,0 and 0,8  Wbc 22.7 hgb 14.1 plt 439    Influenza negative RSV negative COVID 19 negative    Chest radiograph with right rotation, left base scarring and right mid lung opacity, at the  level of the fissure.  Possible destructive lesion involving the lateral portion of the left eight rib.    CT chest with no pulmonary embolism.  Bilateral ground glass opacities, opacity at the right mid lung at the level of the fissure. Multifocal tree and bud nodularity,  Left lower lobe scarring  Multiple rib fractures. The ninth posterolateral left rib is fragmented and irregular. Most likely incomplete healing and surrounding heterotopic ossification.   Assessment and Plan: * Right upper lobe pneumonia Noted tree an bud changes on CT possible aspiration pneumonia.  Complicated with sepsis, tachycardia and leukocytosis (sepsis has resolved)  Acute hypoxemic respiratory failure.   Antibiotic therapy with Unasyn.  Discontinue IV fluids.  Swallow evaluation performed with recommendations for dysphagia diet 3   COPD (chronic obstructive pulmonary disease) (HCC) Acute exacerbation  Plan to continue bronchodilator therapy with albuterol / ipratropium Inhaled and systemic corticosteroids Airway clearing techniques with flutter valve and incentive spirometer.  Continue supplemental 02 per [place] to keep -02 saturation 92% or greater.   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   Depression Continue with mirtazapine    Steroid-induced hyperglycemia Change steroids to 40 mg IV bid methylprednisolone  Add insulin sliding scale for glucose cover and monitoring    Subjective: patient is feeling better, dyspnea and cough are improving, no chest pain,   Physical Exam: Vitals:   03/18/24 0437 03/18/24 0806 03/18/24 0825 03/18/24  1357  BP: (!) 144/74 (!) 144/74 131/70   Pulse: 80  86   Resp: 19  18   Temp: 98.5 F (36.9 C)  98 F (36.7 C)   TempSrc: Oral     SpO2: 95%  94% 94%  Weight:      Height:        Neurology awake and alert, deconditioned ENT with mild pallor Cardiovascular with S1 and S2 present and regular with no gallops rubs or murmurs Respiratory with prolonged expiratory phase with bilateral rhonchi, no wheezing or rales Abdomen with no distention  No lower extremity edema  Data Reviewed:    Family Communication: I spoke with patient's son at the bedside, we talked in detail about patient's condition, plan of care and prognosis and all questions were addressed.   Disposition: Status is: Inpatient Remains inpatient appropriate because: recovering respiratory failure   Planned Discharge Destination: Home      Author: Elidia Toribio Furnace, MD 03/18/2024 3:24 PM  For on call review [email or web address].

## 2024-03-19 DIAGNOSIS — J441 Chronic obstructive pulmonary disease with (acute) exacerbation: Secondary | ICD-10-CM | POA: Diagnosis not present

## 2024-03-19 DIAGNOSIS — I10 Essential (primary) hypertension: Secondary | ICD-10-CM | POA: Diagnosis not present

## 2024-03-19 DIAGNOSIS — R739 Hyperglycemia, unspecified: Secondary | ICD-10-CM

## 2024-03-19 DIAGNOSIS — C3432 Malignant neoplasm of lower lobe, left bronchus or lung: Secondary | ICD-10-CM | POA: Diagnosis not present

## 2024-03-19 DIAGNOSIS — J189 Pneumonia, unspecified organism: Secondary | ICD-10-CM | POA: Diagnosis not present

## 2024-03-19 DIAGNOSIS — T380X5A Adverse effect of glucocorticoids and synthetic analogues, initial encounter: Secondary | ICD-10-CM

## 2024-03-19 LAB — CBC
HCT: 35.5 % — ABNORMAL LOW (ref 36.0–46.0)
Hemoglobin: 11.5 g/dL — ABNORMAL LOW (ref 12.0–15.0)
MCH: 29.1 pg (ref 26.0–34.0)
MCHC: 32.4 g/dL (ref 30.0–36.0)
MCV: 89.9 fL (ref 80.0–100.0)
Platelets: 381 K/uL (ref 150–400)
RBC: 3.95 MIL/uL (ref 3.87–5.11)
RDW: 15.6 % — ABNORMAL HIGH (ref 11.5–15.5)
WBC: 21.2 K/uL — ABNORMAL HIGH (ref 4.0–10.5)
nRBC: 0 % (ref 0.0–0.2)

## 2024-03-19 LAB — GLUCOSE, CAPILLARY
Glucose-Capillary: 143 mg/dL — ABNORMAL HIGH (ref 70–99)
Glucose-Capillary: 157 mg/dL — ABNORMAL HIGH (ref 70–99)
Glucose-Capillary: 201 mg/dL — ABNORMAL HIGH (ref 70–99)
Glucose-Capillary: 154 mg/dL — ABNORMAL HIGH (ref 70–99)

## 2024-03-19 LAB — BASIC METABOLIC PANEL WITH GFR
Anion gap: 11 (ref 5–15)
BUN: 36 mg/dL — ABNORMAL HIGH (ref 8–23)
CO2: 21 mmol/L — ABNORMAL LOW (ref 22–32)
Calcium: 9.4 mg/dL (ref 8.9–10.3)
Chloride: 107 mmol/L (ref 98–111)
Creatinine, Ser: 1.11 mg/dL — ABNORMAL HIGH (ref 0.44–1.00)
GFR, Estimated: 47 mL/min — ABNORMAL LOW (ref >60)
Glucose, Bld: 161 mg/dL — ABNORMAL HIGH (ref 70–99)
Potassium: 4.1 mmol/L (ref 3.5–5.1)
Sodium: 139 mmol/L (ref 135–145)

## 2024-03-19 LAB — CULTURE, BLOOD (ROUTINE X 2): Special Requests: ADEQUATE

## 2024-03-19 MED ORDER — POLYETHYLENE GLYCOL 3350 17 G PO PACK
17.0000 g | PACK | Freq: Every day | ORAL | Status: DC | PRN
  Administered 2024-03-19 – 2024-03-21 (×3): 17 g via ORAL
  Filled 2024-03-19 (×3): qty 1

## 2024-03-19 NOTE — Plan of Care (Signed)

## 2024-03-19 NOTE — Progress Notes (Signed)
 Progress Note   Patient: Sandra Cooper FMW:[id number] DOB: Sep 19, 1929 DOA: 03/17/2024     2 DOS: the patient was seen and examined on 03/19/2024   Brief hospital course: Sandra Cooper was admitted to the hospital with the working diagnosis of COPD exacerbation, possible aspiration pneumonia   89 y.o. female with medical history significant of asthma, COPD, hypertension and history of lung cancer,(adenocarcinoma) sp radiation therapy who presented with dyspnea   09/24 patient was diagnosed with pneumonia, treated with antibiotic therapy and steroids.  Follow up chest radiograph 09/30 with resolution of infiltrates.  Completed antibiotic 10/04 and had follow up with primary care on 10/07 and plan to repeat radiograph in 4 weeks.  10/10 Outpatient follow up with primary care, with worsening cough, dyspnea and fatigue, then she was prescribed home physical therapy.   Patient had physical therapy at home 2 days ago, since then she had a rapid and progressive deterioration on her dyspnea Having symptoms with minimal efforts, worsening cough with increase sputum production, thick phlegm, positive fever and chills.  On the day of admission she was evaluated by primary care, she was noted to have tachypnea and hypoxemia with 02 saturation 88% with minimal efforts, then she was referred to the ED.  On her initial physical examination her blood pressure was 122/85, HR 102, RR 28 and 02 saturation 90% Lungs with prolonged expiratory phase and bilateral wheezing and rhonchi, scattered rales, heart with S1 and S2 present and regular, abdomen with no distention and no lower extremity edema.   Na 136, K 4.3 Cl 102 bicarbonate 24 glucose 130, bun 18 cr 0,95  AST 25 ALT 22 BNP 219  High sensitive troponin 17 and 17  Lactic acid 1,0 and 0,8  Wbc 22.7 hgb 14.1 plt 439    Influenza negative RSV negative COVID 19 negative    Chest radiograph with right rotation, left base scarring and right mid lung opacity, at the  level of the fissure.  Possible destructive lesion involving the lateral portion of the left eight rib.    CT chest with no pulmonary embolism.  Bilateral ground glass opacities, opacity at the right mid lung at the level of the fissure. Multifocal tree and bud nodularity,  Left lower lobe scarring  Multiple rib fractures. The ninth posterolateral left rib is fragmented and irregular. Most likely incomplete healing and surrounding heterotopic ossification.   Patient was placed on IV antibiotic therapy, bronchodilator and corticosteroids.  Dysphagia 3 diet per speech recommendations.   10/19 clinically continue to improve, encourage mobility. Will need 24 hrs care at home.   Assessment and Plan: * Right upper lobe pneumonia Noted tree and bud changes on CT possible aspiration pneumonia.  Complicated with sepsis, tachycardia and leukocytosis (sepsis has resolved)  Acute hypoxemic respiratory failure.   Wbc is 21.2, cultures positive fro gram positive cocci staphylococcus species, only in one bottle.  Patient has been afebrile.   Antibiotic therapy with Unasyn.  Swallow evaluation performed with recommendations for dysphagia diet 3  Discontinue telemetry.   COPD (chronic obstructive pulmonary disease) (HCC) Acute exacerbation  Plan to continue bronchodilator therapy with albuterol / ipratropium Inhaled and systemic corticosteroids Airway clearing techniques with flutter valve and incentive spirometer.  Continue supplemental 02 per [place] to keep -02 saturation 92% or greater.   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  Depression Continue with mirtazapine    Steroid-induced hyperglycemia Insulin sliding scale for glucose cover and monitoring  Fasting glucose this am 161 mg/dl  Hgb J8r 5.8  consistent with pre diabetes.      Subjective: Patient is feeling better, dyspnea and cough are improving but not yet back to baseline, continue very weak and deconditioned   Physical Exam: Vitals:   03/19/24 0345 03/19/24 0757 03/19/24 0815 03/19/24 0840  BP: 117/63 131/69  131/69  Pulse: 83 85    Resp: 19     Temp: 97.6 F (36.4 C) 98 F (36.7 C)    TempSrc:  Oral    SpO2: 95% 94% 95%   Weight:      Height:       Neurology awake and alert, deconditioned ENT with mild pallor with no icterus Cardiovascular with S1 and S2 present and regular with no gallops, rubs or murmurs Respiratory with prolonged expiratory phase with bilaterally expiratory wheezing and bilateral rhonchi, no rales Abdomen with no distention, soft and non tender No lower extremity edema.   Data Reviewed:    Family Communication: no family at the bedside   Disposition: Status is: Inpatient Remains inpatient appropriate because: recovering pneumonia   Planned Discharge Destination: Home     Author: Elidia Toribio Furnace, MD 03/19/2024 11:27 AM  For on call review [email or web address].

## 2024-03-19 NOTE — Evaluation (Signed)
 Occupational Therapy Evaluation Patient Details Name: Sandra Cooper MRN: [id number] DOB: [birth year] Today's Date: 03/19/2024   History of Present Illness   89 y.o. female presenting to the ED 10/17 from primary care appointment she presented with tachypnea and hypoxemia with 02 saturation 88% with minimal efforts, she had a rapid and progressive deterioration on her dyspnea three days ago at PT appointment. Continues to have right upper lobe PNA from 9/24 diagnosis, PMH: medical history significant of asthma, COPD, hypertension and history of lung cancer,(adenocarcinoma) sp radiation therapy     Clinical Impressions PTA, pt lived alone and son stayed with her intermittently. Upon eval, pt needing up to CGA, multiple standing rest breaks to maintain SpO2 >90 on 2 L supplemental O2 as well as with significantly decreased activity tolerance. Son in room on arrival reports he will be able to assist pt at night but pt alone during the day. Pt reports feeling like she cannot care for self at home due to being alone. Recommending inpatient rehab <3 hours; pending progression or son ability to arrange assist for meals, etc during the day, pt may be able to go home with HHOT.      If plan is discharge home, recommend the following:   A little help with walking and/or transfers;A little help with bathing/dressing/bathroom;Assistance with cooking/housework;Assist for transportation;Help with stairs or ramp for entrance     Functional Status Assessment   Patient has had a recent decline in their functional status and demonstrates the ability to make significant improvements in function in a reasonable and predictable amount of time.     Equipment Recommendations   None recommended by OT     Recommendations for Other Services         Precautions/Restrictions   Precautions Precautions: Fall Restrictions Weight Bearing Restrictions Per Provider Order: No     Mobility Bed  Mobility Overal bed mobility: Modified Independent                  Transfers Overall transfer level: Needs assistance Equipment used: Rolling walker (2 wheels) Transfers: Sit to/from Stand Sit to Stand: Supervision           General transfer comment: good power up and self steadying in RW      Balance Overall balance assessment: Needs assistance Sitting-balance support: Feet supported, No upper extremity supported Sitting balance-Leahy Scale: Good     Standing balance support: During functional activity, Reliant on assistive device for balance, Single extremity supported, Bilateral upper extremity supported Standing balance-Leahy Scale: Poor Standing balance comment: requires UE support for dynamic balance                           ADL either performed or assessed with clinical judgement   ADL Overall ADL's : Needs assistance/impaired Eating/Feeding: Sitting;Modified independent   Grooming: Contact guard assist;Standing   Upper Body Bathing: Set up;Sitting   Lower Body Bathing: Minimal assistance;Sit to/from stand   Upper Body Dressing : Set up;Sitting   Lower Body Dressing: Minimal assistance;Sit to/from stand   Toilet Transfer: Contact guard assist;Ambulation;Rolling walker (2 wheels)   Toileting- Clothing Manipulation and Hygiene: Contact guard assist;Sitting/lateral lean;Sit to/from stand       Functional mobility during ADLs: Contact guard assist;Rolling walker (2 wheels)       Vision Baseline Vision/History: 1 Wears glasses Ability to See in Adequate Light: 0 Adequate Patient Visual Report: No change from baseline Vision Assessment?: No apparent visual deficits  Perception Perception: Not tested       Praxis Praxis: Not tested       Pertinent Vitals/Pain Pain Assessment Pain Assessment: No/denies pain     Extremity/Trunk Assessment Upper Extremity Assessment Upper Extremity Assessment: Generalized weakness   Lower  Extremity Assessment Lower Extremity Assessment: Generalized weakness   Cervical / Trunk Assessment Cervical / Trunk Assessment: Kyphotic   Communication Communication Communication: No apparent difficulties;Impaired Factors Affecting Communication: Reduced clarity of speech (raspy voice)   Cognition Arousal: Alert Behavior During Therapy: WFL for tasks assessed/performed Cognition: Difficult to assess Difficult to assess due to: Hard of hearing/deaf           OT - Cognition Comments: follows one step commands.                 Following commands: Intact       Cueing  General Comments   Cueing Techniques: Verbal cues;Tactile cues;Visual cues  VSS on 2L supplemental O2 via [place]   Exercises     Shoulder Instructions      Home Living Family/patient expects to be discharged to:: Private residence Living Arrangements: Alone;Other (Comment) Available Help at Discharge: Family;Available PRN/intermittently (son) Type of Home: House Home Access: Stairs to enter [employer] of Steps: 3   Home Layout: One level     Bathroom Shower/Tub: Producer, television/film/video: Standard     Home Equipment: Cane - single point;Shower seat;Rollator (4 wheels);Grab bars - tub/shower;Toilet riser   Additional Comments: uses Rollator      Prior Functioning/Environment Prior Level of Function : Independent/Modified Independent             Mobility Comments: reports she still drives but thinks she needs to give it up things make her more tired ADLs Comments: reports independence but states that she may need more help with housework    OT Problem List: Decreased strength;Decreased activity tolerance;Impaired balance (sitting and/or standing);Decreased knowledge of use of DME or AE;Decreased knowledge of precautions;Cardiopulmonary status limiting activity   OT Treatment/Interventions: Self-care/ADL training;Therapeutic exercise;DME and/or AE  instruction;Therapeutic activities;Patient/family education;Balance training      OT Goals(Current goals can be found in the care plan section)   Acute Rehab OT Goals OT Goal Formulation: With patient Time For Goal Achievement: 04/02/24 Potential to Achieve Goals: Good   OT Frequency:  Min 2X/week    Co-evaluation              AM-PAC OT 6 Clicks Daily Activity     Outcome Measure Help from another person eating meals?: None Help from another person taking care of personal grooming?: A Little Help from another person toileting, which includes using toliet, bedpan, or urinal?: A Little Help from another person bathing (including washing, rinsing, drying)?: A Little Help from another person to put on and taking off regular upper body clothing?: A Little Help from another person to put on and taking off regular lower body clothing?: A Little 6 Click Score: 19   End of Session Equipment Utilized During Treatment: Gait belt;Rolling walker (2 wheels) Nurse Communication: Mobility status  Activity Tolerance: Patient tolerated treatment well Patient left: in bed;with call bell/phone within reach;with bed alarm set  OT Visit Diagnosis: Unsteadiness on feet (R26.81);Muscle weakness (generalized) (M62.81)                Time: 8788-8766 OT Time Calculation (min): 22 min Charges:  OT General Charges $OT Visit: 1 Visit OT Evaluation $OT Eval Low Complexity: 1 Low  Clemente Dewey  JONETTA Lebron MEMO, OTR/L [facility] Acute Rehabilitation Office: [phone]   Elma JONETTA Lebron 03/19/2024, 1:30 PM

## 2024-03-19 NOTE — Plan of Care (Signed)
  Problem: Education: Goal: Knowledge of General Education information will improve Description: Including pain rating scale, medication(s)/side effects and non-pharmacologic comfort measures Outcome: Progressing   Problem: Clinical Measurements: Goal: Ability to maintain clinical measurements within normal limits will improve Outcome: Progressing   Problem: Activity: Goal: Risk for activity intolerance will decrease Outcome: Progressing   Problem: Nutrition: Goal: Adequate nutrition will be maintained Outcome: Progressing   Problem: Coping: Goal: Level of anxiety will decrease Outcome: Progressing   Problem: Health Behavior/Discharge Planning: Goal: Ability to manage health-related needs will improve Outcome: Not Progressing   Problem: Clinical Measurements: Goal: Will remain free from infection Outcome: Not Progressing   Addendum:  @2100  Pt complained of IV site hurting after RN pushed steriod medication. When IV was removed, pt started to complain of pain but at a different site (not the IV site). Patient later stated that her arm has been hurting since day shift but declined to tell anyone about it. At this time, this RN is unable to figure out the pain but once IV was out and ice was placed on top of the surrounding area, patient stated that the pain has decreased.

## 2024-03-20 DIAGNOSIS — C3432 Malignant neoplasm of lower lobe, left bronchus or lung: Secondary | ICD-10-CM | POA: Diagnosis not present

## 2024-03-20 DIAGNOSIS — J441 Chronic obstructive pulmonary disease with (acute) exacerbation: Secondary | ICD-10-CM | POA: Diagnosis not present

## 2024-03-20 DIAGNOSIS — J189 Pneumonia, unspecified organism: Secondary | ICD-10-CM | POA: Diagnosis not present

## 2024-03-20 DIAGNOSIS — I10 Essential (primary) hypertension: Secondary | ICD-10-CM | POA: Diagnosis not present

## 2024-03-20 LAB — CBC
HCT: 35.2 % — ABNORMAL LOW (ref 36.0–46.0)
Hemoglobin: 11.3 g/dL — ABNORMAL LOW (ref 12.0–15.0)
MCH: 28.8 pg (ref 26.0–34.0)
MCHC: 32.1 g/dL (ref 30.0–36.0)
MCV: 89.6 fL (ref 80.0–100.0)
Platelets: 412 K/uL — ABNORMAL HIGH (ref 150–400)
RBC: 3.93 MIL/uL (ref 3.87–5.11)
RDW: 15.6 % — ABNORMAL HIGH (ref 11.5–15.5)
WBC: 15.9 K/uL — ABNORMAL HIGH (ref 4.0–10.5)
nRBC: 0 % (ref 0.0–0.2)

## 2024-03-20 LAB — GLUCOSE, CAPILLARY
Glucose-Capillary: 134 mg/dL — ABNORMAL HIGH (ref 70–99)
Glucose-Capillary: 132 mg/dL — ABNORMAL HIGH (ref 70–99)
Glucose-Capillary: 154 mg/dL — ABNORMAL HIGH (ref 70–99)

## 2024-03-20 LAB — BASIC METABOLIC PANEL WITH GFR
Anion gap: 9 (ref 5–15)
BUN: 42 mg/dL — ABNORMAL HIGH (ref 8–23)
CO2: 22 mmol/L (ref 22–32)
Calcium: 8.8 mg/dL — ABNORMAL LOW (ref 8.9–10.3)
Chloride: 107 mmol/L (ref 98–111)
Creatinine, Ser: 1 mg/dL (ref 0.44–1.00)
GFR, Estimated: 53 mL/min — ABNORMAL LOW (ref >60)
Glucose, Bld: 171 mg/dL — ABNORMAL HIGH (ref 70–99)
Potassium: 4.2 mmol/L (ref 3.5–5.1)
Sodium: 138 mmol/L (ref 135–145)

## 2024-03-20 NOTE — Care Management Important Message (Signed)
 Important Message  Patient Details  Name: Sandra Cooper MRN: [id number] Date of Birth: 03/09/1930   Important Message Given:  Yes - Medicare IM     [name] 03/20/2024, 3:54 PM

## 2024-03-20 NOTE — NC FL2 (Addendum)
 [place]  MEDICAID FL2 LEVEL OF CARE FORM     IDENTIFICATION  Patient Name: Sandra Cooper Birthdate: 02-05-1930 Sex: female Admission Date (Current Location): 03/17/2024  [facility] and [place] Number:  Producer, television/film/video and Address:  [facility], [address] [place], [place] [address]      Provider Number: [id number]  Attending Physician Name and Address:  Noralee Elidia Sieving,*  Relative Name and Phone Number:  Aislinn Feliz, son - [phone]    Current Level of Care: Hospital Recommended Level of Care: Skilled Nursing Facility Prior Approval Number:    Date Approved/Denied:   PASRR Number:  [id number]  Discharge Plan: SNF    Current Diagnoses: Patient Active Problem List   Diagnosis Date Noted   Steroid-induced hyperglycemia 03/18/2024   Right upper lobe pneumonia 03/17/2024   COPD (chronic obstructive pulmonary disease) (HCC) 03/17/2024   Depression 03/17/2024   Asthma, chronic, unspecified asthma severity, with acute exacerbation 03/21/2022   Stage 3b chronic kidney disease (CKD) (HCC) 03/21/2022   CAP (community acquired pneumonia) 03/19/2022   Goals of care, counseling/discussion 03/27/2019   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) 03/16/2019   Dyspnea 03/10/2019   Hemoptysis 03/09/2019   Lung mass 02/03/2019   Post-menopausal osteoporosis 11/24/2018   Hypertension, essential 02/10/2016    Orientation RESPIRATION BLADDER Height & Weight     Self, Time, Situation, Place  O2 Continent Weight: 50.3 kg Height:  4' 10 (147.3 cm)  BEHAVIORAL SYMPTOMS/MOOD NEUROLOGICAL BOWEL NUTRITION STATUS      Continent Diet (See Discharge Summary)  AMBULATORY STATUS COMMUNICATION OF NEEDS Skin   Limited Assist Verbally Normal                       Personal Care Assistance Level of Assistance  Bathing, Feeding, Dressing Bathing Assistance: Limited assistance Feeding assistance: Independent Dressing Assistance:  Limited assistance     Functional Limitations Info  Sight, Hearing, Speech Sight Info: Impaired (wears glasses) Hearing Info: Impaired (wears hearing aide) Speech Info: Adequate    SPECIAL CARE FACTORS FREQUENCY  PT (By licensed PT), OT (By licensed OT)     PT Frequency: 5 x per week OT Frequency: 5 x per week            Contractures Contractures Info: Not present    Additional Factors Info  Code Status Code Status Info: Partial Code             Current Medications (03/20/2024):  This is the current hospital active medication list Current Facility-Administered Medications  Medication Dose Route Frequency Provider Last Rate Last Admin   acetaminophen  (TYLENOL ) tablet 650 mg  650 mg Oral Q6H PRN Arrien, Elidia Sieving, MD       Or   acetaminophen  (TYLENOL ) suppository 650 mg  650 mg Rectal Q6H PRN Arrien, Elidia Sieving, MD       albuterol  (PROVENTIL ) (2.5 MG/3ML) 0.083% nebulizer solution 2.5 mg  2.5 mg Nebulization Q2H PRN Arrien, Elidia Sieving, MD       amLODipine  (NORVASC ) tablet 2.5 mg  2.5 mg Oral q AM Arrien, Mauricio Daniel, MD   2.5 mg at 03/20/24 0749   Ampicillin-Sulbactam (UNASYN) 3 g in sodium chloride  0.9 % 100 mL IVPB  3 g Intravenous Q12H Noralee Elidia Sieving, MD 200 mL/hr at 03/20/24 0241 3 g at 03/20/24 0241   aspirin chewable tablet 81 mg  81 mg Oral Daily Arrien, Elidia Sieving, MD   81 mg  at 03/20/24 0955   budesonide  (PULMICORT ) nebulizer solution 0.25 mg  0.25 mg Nebulization BID Arrien, Mauricio Daniel, MD   0.25 mg at 03/20/24 9250   cyanocobalamin  (VITAMIN B12) tablet 500 mcg  500 mcg Oral Daily Arrien, Mauricio Daniel, MD   500 mcg at 03/20/24 0955   enoxaparin  (LOVENOX ) injection 30 mg  30 mg Subcutaneous Q24H Arrien, Mauricio Daniel, MD   30 mg at 03/19/24 2234   insulin aspart (novoLOG) injection 0-9 Units  0-9 Units Subcutaneous TID WC Arrien, Mauricio Daniel, MD   1 Units at 03/20/24 0750   losartan  (COZAAR ) tablet 50 mg  50 mg Oral  QPM Arrien, Elidia Sieving, MD   50 mg at 03/19/24 1700   methylPREDNISolone  sodium succinate (SOLU-MEDROL ) 40 mg/mL injection 40 mg  40 mg Intravenous Q12H Arrien, Mauricio Daniel, MD   40 mg at 03/20/24 9044   mirtazapine  (REMERON ) tablet 15 mg  15 mg Oral QHS Arrien, Mauricio Daniel, MD   15 mg at 03/19/24 2107   multivitamin with minerals tablet 1 tablet  1 tablet Oral Daily Arrien, Elidia Sieving, MD   1 tablet at 03/20/24 9044   ondansetron  (ZOFRAN ) tablet 4 mg  4 mg Oral Q6H PRN Arrien, Mauricio Daniel, MD       Or   ondansetron  (ZOFRAN ) injection 4 mg  4 mg Intravenous Q6H PRN Arrien, Mauricio Daniel, MD       polyethylene glycol (MIRALAX / GLYCOLAX) packet 17 g  17 g Oral Daily PRN Shona Laurence N, DO   17 g at 03/20/24 0749   simethicone  (MYLICON) chewable tablet 80 mg  80 mg Oral Q6H PRN Hall, Carole N, DO   80 mg at 03/20/24 9250     Discharge Medications: Please see discharge summary for a list of discharge medications.  Relevant Imaging Results:  Relevant Lab Results:   Additional Information SS# [id number]  Rosaline JONELLE Joe, RN

## 2024-03-20 NOTE — Progress Notes (Signed)
 Transition of Care [facility]) - Inpatient Brief Assessment   Patient Details  Name: Sadiya Durand MRN: [id number] Date of Birth: [birth year]  [facility] CM/SW Contact:    Rosaline JONELLE Joe, RN Phone Number: 03/20/2024, 10:57 AM   Clinical Narrative: CM met with the patient at the bedside to discuss IP Care management needs for SNF placement.  The patient lives alone and states that her son assists at the home 5 days a week for intermittent periods but will need SNF placement for rehabilitation before returning home.  The patient remains on [place] oxygen at this time due to the pneumonia.  DME at the home includes [place], shower seat, rolator, toilet riser, RW.  The patient was agreeable to SNF placement and prefers [facility] if possible.  The patient's son request to be contacted to assist with bed offer choice.  SNF workup to be started for pending bed offers.   Transition of Care Asessment: Insurance and Status: (P) Insurance coverage has been reviewed Patient has primary care physician: (P) Yes Home environment has been reviewed: (P) from home alone Prior level of function:: (P) Rolator Prior/Current Home Services: (P) Current home services (DME at the home includes [place], shower seat, rolator, toilet riswer, RW) Social Drivers of Health Review: (P) SDOH reviewed needs interventions Readmission risk has been reviewed: (P) Yes Transition of care needs: (P) transition of care needs identified, TOC will continue to follow

## 2024-03-20 NOTE — Progress Notes (Signed)
 Progress Note   Patient: Sandra Cooper FMW:[id number] DOB: 02-Dec-1929 DOA: 03/17/2024     3 DOS: the patient was seen and examined on 03/20/2024   Brief hospital course: Sandra Cooper was admitted to the hospital with the working diagnosis of COPD exacerbation, possible aspiration pneumonia   89 y.o. female with medical history significant of asthma, COPD, hypertension and history of lung cancer,(adenocarcinoma) sp radiation therapy who presented with dyspnea   09/24 patient was diagnosed with pneumonia, treated with antibiotic therapy and steroids.  Follow up chest radiograph 09/30 with resolution of infiltrates.  Completed antibiotic 10/04 and had follow up with primary care on 10/07 and plan to repeat radiograph in 4 weeks.  10/10 Outpatient follow up with primary care, with worsening cough, dyspnea and fatigue, then she was prescribed home physical therapy.   Patient had physical therapy at home 2 days ago, since then she had a rapid and progressive deterioration on her dyspnea Having symptoms with minimal efforts, worsening cough with increase sputum production, thick phlegm, positive fever and chills.  On the day of admission she was evaluated by primary care, she was noted to have tachypnea and hypoxemia with 02 saturation 88% with minimal efforts, then she was referred to the ED.  On her initial physical examination her blood pressure was 122/85, HR 102, RR 28 and 02 saturation 90% Lungs with prolonged expiratory phase and bilateral wheezing and rhonchi, scattered rales, heart with S1 and S2 present and regular, abdomen with no distention and no lower extremity edema.   Na 136, K 4.3 Cl 102 bicarbonate 24 glucose 130, bun 18 cr 0,95  AST 25 ALT 22 BNP 219  High sensitive troponin 17 and 17  Lactic acid 1,0 and 0,8  Wbc 22.7 hgb 14.1 plt 439    Influenza negative RSV negative COVID 19 negative    Chest radiograph with right rotation, left base scarring and right mid lung opacity, at the  level of the fissure.  Possible destructive lesion involving the lateral portion of the left eight rib.    CT chest with no pulmonary embolism.  Bilateral ground glass opacities, opacity at the right mid lung at the level of the fissure. Multifocal tree and bud nodularity,  Left lower lobe scarring  Multiple rib fractures. The ninth posterolateral left rib is fragmented and irregular. Most likely incomplete healing and surrounding heterotopic ossification.   Patient was placed on IV antibiotic therapy, bronchodilator and corticosteroids.  Dysphagia 3 diet per speech recommendations.   10/19 clinically continue to improve, encourage mobility. Will need 24 hrs care at home.  10/20 clinically improving, patient will need SNF   Assessment and Plan: * Right upper lobe pneumonia Noted tree and bud changes on CT possible aspiration pneumonia.  Complicated with sepsis, tachycardia and leukocytosis (sepsis has resolved)  Acute hypoxemic respiratory failure.   Wbc is 15.9 Cultures positive fro gram positive cocci staphylococcus species (hominis), only in one bottle. (Likely contamination).  Patient has been afebrile.   Antibiotic therapy with Unasyn.  Swallow evaluation performed with recommendations for dysphagia diet 3  Discontinue telemetry.   COPD (chronic obstructive pulmonary disease) (HCC) Acute exacerbation  Plan to continue bronchodilator therapy with albuterol / ipratropium Inhaled and systemic corticosteroids Airway clearing techniques with flutter valve and incentive spirometer.  Continue supplemental 02 per [place] to keep -02 saturation 92% or greater.  Today 02 saturation is 96% on 2 L/min per [place]   Hypertension, essential Continue blood pressure control with amlodipine , losartan    Primary malignant  neoplasm of bronchus of left lower lobe (HCC) Patient sp radiation therapy  Follows once yearly  Will get further records.   Depression Continue with mirtazapine     Steroid-induced hyperglycemia Insulin sliding scale for glucose cover and monitoring  Fasting glucose this am 171 mg/dl  Hgb J8r 5.8 consistent with pre diabetes.         Subjective: Patient has been feeling better with improved dyspnea, but continue very weak and deconditioned   Physical Exam: Vitals:   03/20/24 0754 03/20/24 1147 03/20/24 1548 03/20/24 1955  BP: (!) 160/93 (!) 149/86 (!) 144/82 (!) 159/87  Pulse: 88 93 88 87  Resp: 16 16 16 18   Temp: 98.7 F (37.1 C) 98.5 F (36.9 C) 98.4 F (36.9 C)   TempSrc: Oral Oral Oral   SpO2: 96% 91% 93% 96%  Weight:      Height:       Neurology awake and alert ENT with mild pallor Cardiovascular with S1 and S2 present and regular with no gallops, rubs or murmurs Respiratory with prolonged expiratory phase with scattered rhonchi with no wheezing Abdomen with no distention No lower extremity edema  Data Reviewed:    Family Communication: no family at the bedside   Disposition: Status is: Inpatient Remains inpatient appropriate because: recovering pneumonia   Planned Discharge Destination: Skilled nursing facility    Author: Elidia Toribio Furnace, MD 03/20/2024 8:53 PM  For on call review [email or web address].

## 2024-03-20 NOTE — Plan of Care (Signed)

## 2024-03-21 DIAGNOSIS — J189 Pneumonia, unspecified organism: Secondary | ICD-10-CM | POA: Diagnosis not present

## 2024-03-21 DIAGNOSIS — J441 Chronic obstructive pulmonary disease with (acute) exacerbation: Secondary | ICD-10-CM | POA: Diagnosis not present

## 2024-03-21 DIAGNOSIS — C3432 Malignant neoplasm of lower lobe, left bronchus or lung: Secondary | ICD-10-CM | POA: Diagnosis not present

## 2024-03-21 DIAGNOSIS — I10 Essential (primary) hypertension: Secondary | ICD-10-CM | POA: Diagnosis not present

## 2024-03-21 LAB — GLUCOSE, CAPILLARY
Glucose-Capillary: 131 mg/dL — ABNORMAL HIGH (ref 70–99)
Glucose-Capillary: 137 mg/dL — ABNORMAL HIGH (ref 70–99)
Glucose-Capillary: 225 mg/dL — ABNORMAL HIGH (ref 70–99)
Glucose-Capillary: 189 mg/dL — ABNORMAL HIGH (ref 70–99)
Glucose-Capillary: 151 mg/dL — ABNORMAL HIGH (ref 70–99)

## 2024-03-21 LAB — CBC
HCT: 38.5 % (ref 36.0–46.0)
Hemoglobin: 12.2 g/dL (ref 12.0–15.0)
MCH: 28.3 pg (ref 26.0–34.0)
MCHC: 31.7 g/dL (ref 30.0–36.0)
MCV: 89.3 fL (ref 80.0–100.0)
Platelets: 470 K/uL — ABNORMAL HIGH (ref 150–400)
RBC: 4.31 MIL/uL (ref 3.87–5.11)
RDW: 15.4 % (ref 11.5–15.5)
WBC: 12.1 K/uL — ABNORMAL HIGH (ref 4.0–10.5)
nRBC: 0 % (ref 0.0–0.2)

## 2024-03-21 NOTE — Progress Notes (Signed)
 Speech Language Pathology Treatment: Dysphagia  Patient Details Name: Sandra Cooper MRN: [id number] DOB: Sep 07, 1929 Today's Date: 03/21/2024 Time: 9084-9068 SLP Time Calculation (min) (ACUTE ONLY): 16 min  Assessment / Plan / Recommendation Clinical Impression  Pt was seen for skilled ST targeting diet tolerance.  Pt was encountered awake/alert in bed and recently completed breakfast meal tray; therefore, PO intake was limited during this session.  Pt reported good tolerance of current diet, but stated that dry foods still give her some trouble, and that she tries to moisten them by adding cottage cheese or other soft foods.  Pt was seen with thin liquid via straw and graham cracker.  She exhibited mildly prolonged but effective mastication of regular solids with trace oral residue that she was sensate to and cleared independently with a liquid wash.  No overt s/sx of aspiration were noted with trials.  Recommend brief follow up during a meal to ensure diet tolerance.  Do not anticipate ST needs at time of discharge.  Recommend continuation of Dysphagia 3 (soft) solids and thin liquids at this time.     HPI HPI: Patient is a 89 y.o. female presenting to the ED from primary care appointment she presented with tachypnea and hypoxemia with 02 saturation 88% with minimal efforts, she had a rapid and progressive deterioration on her dyspnea three days ago at PT appointment. Now having symptoms with minimal efforts, worsening cough with increase sputum production, thick phlegm, positive fever and chills. On 09/24 patient was diagnosed with pneumonia, treated with antibiotic therapy and steroids- follow up chest radiograph 09/30 with resolution of infiltrates. Previous esophogram barium from May of 2023 indicated Mild intermittent esophageal dysmotility with tertiary contractions PMH: medical history significant of asthma, COPD, hypertension and history of lung cancer,(adenocarcinoma) sp radiation therapy who  presented with dyspnea             Recommendations  Diet recommendations: Dysphagia 3 (mechanical soft);Thin liquid Liquids provided via: Cup;Straw Medication Administration: Whole meds with liquid Supervision: Patient able to self feed Compensations: Minimize environmental distractions;Slow rate;Small sips/bites;Follow solids with liquid                  Oral care BID     Dysphagia, unspecified (R13.10)          Earnie Cable, M.S., CCC-SLP Acute Rehabilitation Services Office: [phone]  Earnie SQUIBB [facility]  03/21/2024, 9:36 AM

## 2024-03-21 NOTE — Plan of Care (Signed)

## 2024-03-21 NOTE — Progress Notes (Signed)
 Mobility Specialist: Progress Note   03/21/24 1500  Mobility  Activity Pivoted/transferred from bed to chair  Level of Assistance Contact guard assist, steadying assist  Assistive Device Front wheel walker  Distance Ambulated (ft) 3 ft  Activity Response Tolerated well  Mobility Referral Yes  Mobility visit 1 Mobility  Mobility Specialist Start Time (ACUTE ONLY) 1315  Mobility Specialist Stop Time (ACUTE ONLY) 1335  Mobility Specialist Time Calculation (min) (ACUTE ONLY) 20 min    Pt received in bed, very pleasant. Politely declined ambulation at this time, requested to sit up in the chair for awhile first and wait to walk till after lunch/meds. Light minA for bed mobility to assist with trunk elevation. MinG to stand and transfer to the chair. Left in chair with all needs met, call bell in reach. Chair alarm on. Will f/u if time allows.   Ileana Lute Mobility Specialist Please contact via SecureChat or Rehab office at [phone]

## 2024-03-21 NOTE — Progress Notes (Signed)
 Physical Therapy Treatment Patient Details Name: Sandra Cooper MRN: [id number] DOB: 17-Jan-1930 Today's Date: 03/21/2024   History of Present Illness 89 y.o. female presenting to the ED 10/17 from primary care appointment she presented with tachypnea and hypoxemia with 02 saturation 88% with minimal efforts, she had a rapid and progressive deterioration on her dyspnea three days ago at PT appointment. Continues to have right upper lobe PNA from 9/24 diagnosis, PMH: medical history significant of asthma, COPD, hypertension and history of lung cancer,(adenocarcinoma) sp radiation therapy    PT Comments  Patient progressing slowly with mobility, needing seated rest due to fatigue and SOB though SpO2 throughout on RA 92-93%.  Patient with posterior bias per her report and had two episodes with transitions (sit to stand and stand to sit) with posterior LOB needing min A.  Recommending post-acute inpatient rehab (<3 hours/day) prior to d/c home.     If plan is discharge home, recommend the following: A little help with walking and/or transfers;A little help with bathing/dressing/bathroom;Help with stairs or ramp for entrance   Can travel by private vehicle     Yes  Equipment Recommendations  None recommended by PT    Recommendations for Other Services       Precautions / Restrictions Precautions Precautions: Fall Restrictions Weight Bearing Restrictions Per Provider Order: No     Mobility  Bed Mobility Overal bed mobility: Modified Independent                  Transfers   Equipment used: Rollator (4 wheels) Transfers: Sit to/from Stand Sit to Stand: Contact guard assist           General transfer comment: pulls up on rollator and it tipped back, stats she does this at home due to falling back tendency    Ambulation/Gait Ambulation/Gait assistance: Contact guard assist, Min assist Gait Distance (Feet): 65 Feet (one seated rest on rollator) Assistive device: Rollator (4  wheels) Gait Pattern/deviations: Step-through pattern, Decreased step length - right, Decreased step length - left, Shuffle, Trunk flexed       General Gait Details: CGA mostly though couple episodes of knee buckling walking back into room with min A for safety; big LOB posterior getting back to EOB with min A and cues for reaching to bed rail for safety   Stairs             Wheelchair Mobility     Tilt Bed    Modified Rankin (Stroke Patients Only)       Balance Overall balance assessment: Needs assistance Sitting-balance support: Feet supported Sitting balance-Leahy Scale: Good Sitting balance - Comments: completed toilet hygiene in bathroom   Standing balance support: Bilateral upper extremity supported, During functional activity, Reliant on assistive device for balance, No upper extremity supported Standing balance-Leahy Scale: Poor Standing balance comment: CGA while washing hands at sink                            Communication Communication Communication: Impaired Factors Affecting Communication: Hearing impaired (hearing aides)  Cognition Arousal: Alert Behavior During Therapy: WFL for tasks assessed/performed   PT - Cognitive impairments: No apparent impairments, No family/caregiver present to determine baseline                         Following commands: Intact      Cueing Cueing Techniques: Verbal cues, Tactile cues, Visual cues  Exercises  General Comments General comments (skin integrity, edema, etc.): ambulated on RA SpO2 92-93% throughout though SOB evident and asking for O2 when back in bed; discussion with son about not qualifying for home O2 though she plans to go to rehab first; he asked about resources to get home O2 if needed      Pertinent Vitals/Pain Pain Assessment Pain Assessment: No/denies pain    Home Living                          Prior Function            PT Goals (current goals can  now be found in the care plan section) Progress towards PT goals: Progressing toward goals    Frequency    Min 2X/week      PT Plan      Co-evaluation              AM-PAC PT 6 Clicks Mobility   Outcome Measure  Help needed turning from your back to your side while in a flat bed without using bedrails?: None Help needed moving from lying on your back to sitting on the side of a flat bed without using bedrails?: None Help needed moving to and from a bed to a chair (including a wheelchair)?: A Little Help needed standing up from a chair using your arms (e.g., wheelchair or bedside chair)?: A Little Help needed to walk in hospital room?: A Little Help needed climbing 3-5 steps with a railing? : Total 6 Click Score: 18    End of Session Equipment Utilized During Treatment: Gait belt;Oxygen Activity Tolerance: Patient limited by fatigue Patient left: in bed;with call bell/phone within reach;with family/visitor present   PT Visit Diagnosis: Unsteadiness on feet (R26.81);Muscle weakness (generalized) (M62.81);Difficulty in walking, not elsewhere classified (R26.2)     Time: 1125-1150 PT Time Calculation (min) (ACUTE ONLY): 25 min  Charges:    $Gait Training: 8-22 mins $Therapeutic Activity: 8-22 mins PT General Charges $$ ACUTE PT VISIT: 1 Visit                     Micheline Cooper, PT Acute Rehabilitation Services [phone] 03/21/2024    Sandra Cooper 03/21/2024, 12:56 PM

## 2024-03-21 NOTE — Plan of Care (Signed)
  Problem: Education: Goal: Knowledge of General Education information will improve Description: Including pain rating scale, medication(s)/side effects and non-pharmacologic comfort measures Outcome: Progressing   Problem: Health Behavior/Discharge Planning: Goal: Ability to manage health-related needs will improve Outcome: Progressing   Problem: Clinical Measurements: Goal: Ability to maintain clinical measurements within normal limits will improve Outcome: Progressing Goal: Diagnostic test results will improve Outcome: Progressing Goal: Respiratory complications will improve Outcome: Progressing Goal: Cardiovascular complication will be avoided Outcome: Progressing   Problem: Activity: Goal: Risk for activity intolerance will decrease Outcome: Progressing   Problem: Nutrition: Goal: Adequate nutrition will be maintained Outcome: Progressing   Problem: Coping: Goal: Level of anxiety will decrease Outcome: Progressing   Problem: Elimination: Goal: Will not experience complications related to bowel motility Outcome: Progressing Goal: Will not experience complications related to urinary retention Outcome: Progressing   Problem: Pain Managment: Goal: General experience of comfort will improve and/or be controlled Outcome: Progressing   Problem: Safety: Goal: Ability to remain free from injury will improve Outcome: Progressing   Problem: Skin Integrity: Goal: Risk for impaired skin integrity will decrease Outcome: Progressing   Problem: Education: Goal: Ability to describe self-care measures that may prevent or decrease complications (Diabetes Survival Skills Education) will improve Outcome: Progressing Goal: Individualized Educational Video(s) Outcome: Progressing   Problem: Coping: Goal: Ability to adjust to condition or change in health will improve Outcome: Progressing   Problem: Fluid Volume: Goal: Ability to maintain a balanced intake and output will  improve Outcome: Progressing   Problem: Health Behavior/Discharge Planning: Goal: Ability to identify and utilize available resources and services will improve Outcome: Progressing Goal: Ability to manage health-related needs will improve Outcome: Progressing   Problem: Metabolic: Goal: Ability to maintain appropriate glucose levels will improve Outcome: Progressing   Problem: Nutritional: Goal: Maintenance of adequate nutrition will improve Outcome: Progressing Goal: Progress toward achieving an optimal weight will improve Outcome: Progressing   Problem: Skin Integrity: Goal: Risk for impaired skin integrity will decrease Outcome: Progressing   Problem: Tissue Perfusion: Goal: Adequacy of tissue perfusion will improve Outcome: Progressing   Problem: Clinical Measurements: Goal: Will remain free from infection Outcome: Not Progressing

## 2024-03-21 NOTE — Progress Notes (Signed)
 Progress Note   Patient: Josy Peaden FMW:[id number] DOB: August 18, 1929 DOA: 03/17/2024     4 DOS: the patient was seen and examined on 03/21/2024   Brief hospital course: Mrs. Tapscott was admitted to the hospital with the working diagnosis of COPD exacerbation, possible aspiration pneumonia   89 y.o. female with medical history significant of asthma, COPD, hypertension and history of lung cancer,(adenocarcinoma) sp radiation therapy who presented with dyspnea   09/24 patient was diagnosed with pneumonia, treated with antibiotic therapy and steroids.  Follow up chest radiograph 09/30 with resolution of infiltrates.  Completed antibiotic 10/04 and had follow up with primary care on 10/07 and plan to repeat radiograph in 4 weeks.  10/10 Outpatient follow up with primary care, with worsening cough, dyspnea and fatigue, then she was prescribed home physical therapy.   Patient had physical therapy at home 2 days ago, since then she had a rapid and progressive deterioration on her dyspnea Having symptoms with minimal efforts, worsening cough with increase sputum production, thick phlegm, positive fever and chills.  On the day of admission she was evaluated by primary care, she was noted to have tachypnea and hypoxemia with 02 saturation 88% with minimal efforts, then she was referred to the ED.  On her initial physical examination her blood pressure was 122/85, HR 102, RR 28 and 02 saturation 90% Lungs with prolonged expiratory phase and bilateral wheezing and rhonchi, scattered rales, heart with S1 and S2 present and regular, abdomen with no distention and no lower extremity edema.   Na 136, K 4.3 Cl 102 bicarbonate 24 glucose 130, bun 18 cr 0,95  AST 25 ALT 22 BNP 219  High sensitive troponin 17 and 17  Lactic acid 1,0 and 0,8  Wbc 22.7 hgb 14.1 plt 439    Influenza negative RSV negative COVID 19 negative    Chest radiograph with right rotation, left base scarring and right mid lung opacity, at the  level of the fissure.  Possible destructive lesion involving the lateral portion of the left eight rib.    CT chest with no pulmonary embolism.  Bilateral ground glass opacities, opacity at the right mid lung at the level of the fissure. Multifocal tree and bud nodularity,  Left lower lobe scarring  Multiple rib fractures. The ninth posterolateral left rib is fragmented and irregular. Most likely incomplete healing and surrounding heterotopic ossification.   Patient was placed on IV antibiotic therapy, bronchodilator and corticosteroids.  Dysphagia 3 diet per speech recommendations.   10/19 clinically continue to improve, encourage mobility. Will need 24 hrs care at home.  10/20 clinically improving, patient will need SNF  10/21 patient medically ready for SNF   Assessment and Plan: * Right upper lobe pneumonia Noted tree and bud changes on CT possible aspiration pneumonia.  Complicated with sepsis, tachycardia and leukocytosis (sepsis has resolved)  Acute hypoxemic respiratory failure.   Wbc is 15.9 Culture positive fro gram positive cocci staphylococcus species (hominis), only in one bottle. (Likely contamination).  Patient has been afebrile.   Antibiotic therapy with Unasyn.  Swallow evaluation performed with recommendations for dysphagia diet 3   COPD (chronic obstructive pulmonary disease) (HCC) Acute exacerbation  Plan to continue bronchodilator therapy with albuterol / ipratropium Inhaled and systemic corticosteroids Airway clearing techniques with flutter valve and incentive spirometer.  Continue supplemental 02 per [place] to keep -02 saturation 92% or greater.  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         Subjective: Patient is feeling better, dyspnea is improving, improve cough, continue very weak and deconditioned  Physical Exam: Vitals:   03/20/24 2353 03/21/24 0455 03/21/24 0751 03/21/24 0900  BP: (!) 154/85 (!) 167/86  (!) 157/85  Pulse: 77 78 80 81  Resp: 18  18   Temp: 98 F (36.7 C) 97.6 F (36.4 C)    TempSrc:      SpO2: 94% 95% 95% 95%  Weight:      Height:         Neurology awake and alert ENT with mild pallor Cardiovascular with S1 and S2 present and regular with no gallops, rubs or murmurs Respiratory with prolonged expiratory phase with rhonchi bilaterally with no wheezing or rales Abdomen soft and non tender No lower extremity edema   Data Reviewed:    Family Communication: I spoke with patient's son over the phone  at the bedside, we talked in detail about patient's condition, plan of care and prognosis and all questions were addressed.   Disposition: Status is: Inpatient Remains inpatient appropriate because: recovering from pneumonia pending transfer to SNF   Planned Discharge Destination: Skilled nursing facility     Author: Elidia Toribio Furnace, MD 03/21/2024 10:40 AM  For on call review [email or web address].

## 2024-03-22 DIAGNOSIS — J189 Pneumonia, unspecified organism: Secondary | ICD-10-CM | POA: Diagnosis not present

## 2024-03-22 LAB — CBC
HCT: 44.8 % (ref 36.0–46.0)
Hemoglobin: 14.3 g/dL (ref 12.0–15.0)
MCH: 28.7 pg (ref 26.0–34.0)
MCHC: 31.9 g/dL (ref 30.0–36.0)
MCV: 90 fL (ref 80.0–100.0)
Platelets: 575 K/uL — ABNORMAL HIGH (ref 150–400)
RBC: 4.98 MIL/uL (ref 3.87–5.11)
RDW: 15.4 % (ref 11.5–15.5)
WBC: 16.9 K/uL — ABNORMAL HIGH (ref 4.0–10.5)
nRBC: 0.1 % (ref 0.0–0.2)

## 2024-03-22 LAB — CULTURE, BLOOD (ROUTINE X 2)
Culture: NO GROWTH
Special Requests: ADEQUATE

## 2024-03-22 LAB — GLUCOSE, CAPILLARY
Glucose-Capillary: 127 mg/dL — ABNORMAL HIGH (ref 70–99)
Glucose-Capillary: 125 mg/dL — ABNORMAL HIGH (ref 70–99)
Glucose-Capillary: 169 mg/dL — ABNORMAL HIGH (ref 70–99)
Glucose-Capillary: 130 mg/dL — ABNORMAL HIGH (ref 70–99)

## 2024-03-22 LAB — BASIC METABOLIC PANEL WITH GFR
Anion gap: 10 (ref 5–15)
BUN: 38 mg/dL — ABNORMAL HIGH (ref 8–23)
CO2: 23 mmol/L (ref 22–32)
Calcium: 9.2 mg/dL (ref 8.9–10.3)
Chloride: 104 mmol/L (ref 98–111)
Creatinine, Ser: 0.92 mg/dL (ref 0.44–1.00)
GFR, Estimated: 58 mL/min — ABNORMAL LOW (ref >60)
Glucose, Bld: 138 mg/dL — ABNORMAL HIGH (ref 70–99)
Potassium: 4.4 mmol/L (ref 3.5–5.1)
Sodium: 137 mmol/L (ref 135–145)

## 2024-03-22 MED ORDER — MORPHINE SULFATE (PF) 2 MG/ML IV SOLN
1.0000 mg | Freq: Once | INTRAVENOUS | Status: AC | PRN
  Administered 2024-03-22: 1 mg via INTRAVENOUS
  Filled 2024-03-22: qty 1

## 2024-03-22 MED ORDER — NALOXONE HCL 0.4 MG/ML IJ SOLN: 0.4000 mg | INTRAMUSCULAR | Status: DC | PRN

## 2024-03-22 NOTE — TOC Progression Note (Addendum)
 Transition of Care [facility]) - Progression Note    Patient Details  Name: Sandra Cooper MRN: [id number] Date of Birth: 01/14/1930  Transition of Care [facility]) CM/SW Contact  Sherline Clack, [place] Phone Number: 03/22/2024, 11:53 AM  Clinical Narrative:     Update 4:38 PM: Insurance auth approved 10/22-10/24, shara ID: [id number].   Patient and her family chose [facility] for SNF based on list with Medicare ratings provided by CSW. CSW submitted insurance auth for [facility]. Currently pending, auth ID: [id number]. CSW will continue to follow.   Expected Discharge Plan: Skilled Nursing Facility Barriers to Discharge: Continued Medical Work up, English as a second language teacher               Expected Discharge Plan and Services       Living arrangements for the past 2 months: Single Family Home                                       Social Drivers of Health (SDOH) Interventions SDOH Screenings   Food Insecurity: No Food Insecurity (03/17/2024)  Housing: Low Risk  (03/17/2024)  Transportation Needs: No Transportation Needs (03/17/2024)  Utilities: Not At Risk (03/17/2024)  Financial Resource Strain: Low Risk  (02/29/2024)   Received from [facility]  Physical Activity: Inactive (02/29/2024)   Received from [facility]  Social Connections: Moderately Isolated (03/17/2024)  Stress: No Stress Concern Present (02/29/2024)   Received from [facility]  Tobacco Use: Low Risk  (03/17/2024)  Health Literacy: High Risk (06/22/2023)   Received from [facility]    Readmission Risk Interventions    03/20/2024   10:56 AM  Readmission Risk Prevention Plan  Transportation Screening Complete  PCP or Specialist Appt within 5-7 Days Complete  Home Care Screening Complete  Medication Review (RN CM) Complete

## 2024-03-22 NOTE — Progress Notes (Signed)
 Mobility Specialist: Progress Note   03/22/24 1000  Mobility  Activity Ambulated with assistance  Level of Assistance Contact guard assist, steadying assist  Assistive Device Four wheel walker  Distance Ambulated (ft) 50 ft  Activity Response Tolerated well  Mobility Referral Yes  Mobility visit 1 Mobility  Mobility Specialist Start Time (ACUTE ONLY) 0940  Mobility Specialist Stop Time (ACUTE ONLY) 0953  Mobility Specialist Time Calculation (min) (ACUTE ONLY) 13 min    Pt received in bed, agreeable to mobility session. Light minA for bed mobility to assist with trunk elevation. SV for STS. CGA mostly for ambulation. 5x occurrences of posterior LOB requiring minG to correct. Distance limited by fatigue, pt returned to room. Politely declined sitting up in the chair at this time. MinA for sit>supine to assist BLE back in bed. Left in bed with all needs met, call bell in reach.    Ileana Lute Mobility Specialist Please contact via SecureChat or Rehab office at [phone]

## 2024-03-22 NOTE — Progress Notes (Signed)
 PROGRESS NOTE    Sandra Cooper  FMW:[id number] DOB: 06-24-1929 DOA: 03/17/2024 PCP: Dorcus Lamar POUR, MD   Brief Narrative:  This 89 yrs old female with PMH significant for asthma, COPD, hypertension and history of lung cancer(adenocarcinoma) s/p radiation therapy who presented with shortness of breath.  Patient has recently completed antibiotics course for pneumonia. Her symptoms has gotten worse, She was evaluated by primary care physician and was noted to have tachypnea,  hypoxemia with SpO2 of 88% on room air.  Patient was sent in the ED for further evaluation.CT chest ruled out pulmonary embolism but shows bilateral ground glass opacities, multifocal tree-in-bud nodularity, multiple rib fractures, ninth posterior lateral left rib is fragmented and irregular.  Patient was admitted for further evaluation and started on bronchodilator therapy,  corticosteroids and IV antibiotics.  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 Noted tree and bud changes on CT Chest,  possible aspiration pneumonia. PE ruled out. Complicated with sepsis, tachycardia and leukocytosis (sepsis has now resolved)  Continue supplemental oxygen and wean as tolerated. Wbc is 15.9, Patient has been afebrile.  Culture positive for gram positive cocci staphylococcus species (hominis), only in one bottle. (Likely contamination).  Continue Antibiotic therapy with Unasyn.  Swallow evaluation performed with recommendations for dysphagia diet 3.  COPD with acute exacerbation: Continue bronchodilator therapy with albuterol  / Ipratropium. Continue Inhaled and  systemic corticosteroids. Airway clearing techniques with flutter valve and incentive spirometer.  Continue supplemental 02 per [place] to keep -02 saturation 92% or greater.  Today 02 saturation is 94% on 2 L/min per     Hypertension: Continue amlodipine , losartan .   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  Follows once yearly with oncology. Ambulatory referral for oncology .   Depression: Continue with mirtazapine     Steroid-induced hyperglycemia: Insulin sliding scale for glucose cover and monitoring  Fasting glucose this am 134 mg/dl. Hgb A1c 5.8 consistent with pre diabetes.    DVT prophylaxis: Lovenox  Code Status:  DNR / Partial Family Communication: No family at bed side. Disposition Plan:   Status is: Inpatient Remains inpatient appropriate because: Severity of illness.   Consultants:  None  Procedures: CT chest.  Antimicrobials:  Anti-infectives (From admission, onward)    Start     Dose/Rate Route Frequency Ordered Stop   03/18/24 1500  Ampicillin-Sulbactam (UNASYN) 3 g in sodium chloride  0.9 % 100 mL IVPB        3 g 200 mL/hr over 30 Minutes Intravenous Every 12 hours 03/17/24 1919     03/17/24 1600  ceFEPIme (MAXIPIME) 2 g in sodium chloride  0.9 % 100 mL IVPB  Status:  Discontinued        2 g 200 mL/hr over 30 Minutes Intravenous Every 24 hours 03/17/24 1544 03/17/24 1924   03/17/24 1545  vancomycin (VANCOREADY) IVPB 1500 mg/300 mL  Status:  Discontinued        1,500 mg 150 mL/hr over 120 Minutes Intravenous  Once 03/17/24 1530 03/17/24 1531   03/17/24 1545  vancomycin (VANCOCIN) IVPB 1000 mg/200 mL premix  Status:  Discontinued        1,000 mg 200 mL/hr  over 60 Minutes Intravenous  Once 03/17/24 1531 03/17/24 1924   03/17/24 1530  ceFEPIme (MAXIPIME) 2 g in sodium chloride  0.9 % 100 mL IVPB  Status:  Discontinued        2 g 200 mL/hr over 30 Minutes Intravenous Every 12 hours 03/17/24 1530 03/17/24  1544   03/17/24 1400  cefTRIAXone  (ROCEPHIN ) 2 g in sodium chloride  0.9 % 100 mL IVPB        2 g 200 mL/hr over 30 Minutes Intravenous Once 03/17/24 1359 03/17/24 1545   03/17/24 1400  azithromycin  (ZITHROMAX ) 500 mg in sodium chloride  0.9 % 250 mL IVPB        500 mg 250 mL/hr over 60 Minutes Intravenous  Once 03/17/24 1359 03/17/24 1723      Subjective: Patient was seen and examined at bedside.  Overnight events noted. Patient reports feeling improved.  She remains on 3 L of supplemental oxygen. Appears clinically improved.  Objective: Vitals:   03/22/24 0514 03/22/24 0534 03/22/24 0806 03/22/24 0809  BP: (!) 162/88 (!) 166/94 (!) 168/98   Pulse: 73  79 79  Resp:    16  Temp: 97.6 F (36.4 C)  97.9 F (36.6 C)   TempSrc:   Oral   SpO2: 94%  98% 98%  Weight:      Height:        Intake/Output Summary (Last 24 hours) at 03/22/2024 1134 Last data filed at 03/22/2024 0859 Gross per 24 hour  Intake --  Output 800 ml  Net -800 ml   Filed Weights   03/17/24 1359  Weight: 50.3 kg    Examination:  General exam: Appears calm and comfortable, deconditioned, not in any acute distress. Respiratory system: CTA Bilaterally . Respiratory effort normal. RR 17 Cardiovascular system: S1 & S2 heard, RRR. No JVD, murmurs, rubs, gallops or clicks.  Gastrointestinal system: Abdomen is non distended, soft and non tender.  Normal bowel sounds heard. Central nervous system: Alert and oriented x 3. No focal neurological deficits. Extremities: No edema, no cyanosis, no clubbing. Skin: No rashes, lesions or ulcers Psychiatry: Judgement and insight appear normal. Mood & affect appropriate.   Data Reviewed: I have personally reviewed following labs and imaging studies  CBC: Recent Labs  Lab 03/17/24 1407 03/17/24 2329 03/18/24 0142 03/19/24 0534 03/20/24 0455 03/21/24 0430 03/22/24 0436  WBC 22.7*   < > 22.3* 21.2* 15.9* 12.1* 16.9*  NEUTROABS 20.1*  --   --   --   --   --   --    HGB 14.1   < > 12.1 11.5* 11.3* 12.2 14.3  HCT 42.5   < > 37.1 35.5* 35.2* 38.5 44.8  MCV 88.4   < > 90.0 89.9 89.6 89.3 90.0  PLT 439*   < > 382 381 412* 470* 575*   < > = values in this interval not displayed.   Basic Metabolic Panel: Recent Labs  Lab 03/17/24 1407 03/17/24 2329 03/18/24 0142 03/19/24 0534 03/20/24 0455 03/22/24 0436  NA 136  --  139 139 138 137  K 4.3  --  3.7 4.1 4.2 4.4  CL 102  --  104 107 107 104  CO2 24  --  24 21* 22 23  GLUCOSE 130*  --  336* 161* 171* 138*  BUN 18  --  18 36* 42* 38*  CREATININE 0.95 1.08* 1.07* 1.11* 1.00 0.92  CALCIUM 9.5  --  9.0 9.4 8.8* 9.2   GFR: Estimated Creatinine Clearance: 27.5  mL/min (by C-G formula based on SCr of 0.92 mg/dL). Liver Function Tests: Recent Labs  Lab 03/17/24 1407  AST 25  ALT 22  ALKPHOS 72  BILITOT 0.7  PROT 6.8  ALBUMIN 3.3*   No results for input(s): LIPASE, AMYLASE in the last 168 hours. No results for input(s): AMMONIA in the last 168 hours. Coagulation Profile: Recent Labs  Lab 03/17/24 1407  INR 1.1   Cardiac Enzymes: No results for input(s): CKTOTAL, CKMB, CKMBINDEX, TROPONINI in the last 168 hours. BNP (last 3 results) No results for input(s): PROBNP in the last 8760 hours. HbA1C: No results for input(s): HGBA1C in the last 72 hours. CBG: Recent Labs  Lab 03/21/24 1152 03/21/24 1546 03/21/24 1735 03/21/24 2020 03/22/24 0804  GLUCAP 137* 225* 189* 151* 127*   Lipid Profile: No results for input(s): CHOL, HDL, LDLCALC, TRIG, CHOLHDL, LDLDIRECT in the last 72 hours. Thyroid  Function Tests: No results for input(s): TSH, T4TOTAL, FREET4, T3FREE, THYROIDAB in the last 72 hours. Anemia Panel: No results for input(s): VITAMINB12, FOLATE, FERRITIN, TIBC, IRON, RETICCTPCT in the last 72 hours. Sepsis Labs: Recent Labs  Lab 03/17/24 1417 03/17/24 1724  LATICACIDVEN 1.0 0.8    Recent Results (from the past 240 hours)   Resp panel by RT-PCR (RSV, Flu A&B, Covid) Anterior Nasal Swab     Status: None   Collection Time: 03/17/24  1:58 PM   Specimen: Anterior Nasal Swab  Result Value Ref Range Status   SARS Coronavirus 2 by RT PCR NEGATIVE NEGATIVE Final   Influenza A by PCR NEGATIVE NEGATIVE Final   Influenza B by PCR NEGATIVE NEGATIVE Final    Comment: (NOTE) The Xpert Xpress SARS-CoV-2/FLU/RSV plus assay is intended as an aid in the diagnosis of influenza from Nasopharyngeal swab specimens and should not be used as a sole basis for treatment. Nasal washings and aspirates are unacceptable for Xpert Xpress SARS-CoV-2/FLU/RSV testing.  Fact Sheet for Patients: BloggerCourse.com  Fact Sheet for Healthcare Providers: SeriousBroker.it  This test is not yet approved or cleared by the [place]  FDA and has been authorized for detection and/or diagnosis of SARS-CoV-2 by FDA under an Emergency Use Authorization (EUA). This EUA will remain in effect (meaning this test can be used) for the duration of the COVID-19 declaration under Section 564(b)(1) of the Act, 21 U.S.C. section 360bbb-3(b)(1), unless the authorization is terminated or revoked.     Resp Syncytial Virus by PCR NEGATIVE NEGATIVE Final    Comment: (NOTE) Fact Sheet for Patients: BloggerCourse.com  Fact Sheet for Healthcare Providers: SeriousBroker.it  This test is not yet approved or cleared by the [place]  FDA and has been authorized for detection and/or diagnosis of SARS-CoV-2 by FDA under an Emergency Use Authorization (EUA). This EUA will remain in effect (meaning this test can be used) for the duration of the COVID-19 declaration under Section 564(b)(1) of the Act, 21 U.S.C. section 360bbb-3(b)(1), unless the authorization is terminated or revoked.  Performed at [facility], [address] [place],  [place] [address]   Blood Culture (routine x 2)     Status: None   Collection Time: 03/17/24  2:07 PM   Specimen: BLOOD  Result Value Ref Range Status   Specimen Description BLOOD LEFT ANTECUBITAL  Final   Special Requests   Final    BOTTLES DRAWN AEROBIC AND ANAEROBIC Blood Culture adequate volume   Culture   Final    NO GROWTH 5 DAYS Performed at [facility], [address], [place], [place] [address]    Report Status 03/22/2024 FINAL  Final  Blood Culture (routine x 2)     Status: Abnormal   Collection Time: 03/17/24  2:50 PM   Specimen: Right Antecubital; Blood  Result Value Ref Range Status   Specimen Description RIGHT ANTECUBITAL BLOOD  Final   Special Requests   Final    BOTTLES DRAWN AEROBIC AND ANAEROBIC Blood Culture adequate volume   Culture  Setup Time   Final    GRAM POSITIVE COCCI AEROBIC BOTTLE ONLY CRITICAL RESULT CALLED TO, READ BACK BY AND VERIFIED WITH: PHARMD LISA C. [id number] AT 1757, ADC    Culture (A)  Final    STAPHYLOCOCCUS HOMINIS THE SIGNIFICANCE OF ISOLATING THIS ORGANISM FROM A SINGLE SET OF BLOOD CULTURES WHEN MULTIPLE SETS ARE DRAWN IS UNCERTAIN. PLEASE NOTIFY THE MICROBIOLOGY DEPARTMENT WITHIN ONE WEEK IF SPECIATION AND SENSITIVITIES ARE REQUIRED. Performed at [facility], [address], [place], [place] [address]    Report Status 03/19/2024 FINAL  Final  Blood Culture ID Panel (Reflexed)     Status: Abnormal   Collection Time: 03/17/24  2:50 PM  Result Value Ref Range Status   Enterococcus faecalis NOT DETECTED NOT DETECTED Final   Enterococcus Faecium NOT DETECTED NOT DETECTED Final   Listeria monocytogenes NOT DETECTED NOT DETECTED Final   Staphylococcus species DETECTED (A) NOT DETECTED Final    Comment: CRITICAL RESULT CALLED TO, READ BACK BY AND VERIFIED WITH: PHARMD LISA C. [id number] AT 1757, ADC    Staphylococcus aureus (BCID) NOT DETECTED NOT DETECTED Final   Staphylococcus epidermidis NOT DETECTED NOT DETECTED Final    Staphylococcus lugdunensis NOT DETECTED NOT DETECTED Final   Streptococcus species NOT DETECTED NOT DETECTED Final   Streptococcus agalactiae NOT DETECTED NOT DETECTED Final   Streptococcus pneumoniae NOT DETECTED NOT DETECTED Final   Streptococcus pyogenes NOT DETECTED NOT DETECTED Final   A.calcoaceticus-baumannii NOT DETECTED NOT DETECTED Final   Bacteroides fragilis NOT DETECTED NOT DETECTED Final   Enterobacterales NOT DETECTED NOT DETECTED Final   Enterobacter cloacae complex NOT DETECTED NOT DETECTED Final   Escherichia coli NOT DETECTED NOT DETECTED Final   Klebsiella aerogenes NOT DETECTED NOT DETECTED Final   Klebsiella oxytoca NOT DETECTED NOT DETECTED Final   Klebsiella pneumoniae NOT DETECTED NOT DETECTED Final   Proteus species NOT DETECTED NOT DETECTED Final   Salmonella species NOT DETECTED NOT DETECTED Final   Serratia marcescens NOT DETECTED NOT DETECTED Final   Haemophilus influenzae NOT DETECTED NOT DETECTED Final   Neisseria meningitidis NOT DETECTED NOT DETECTED Final   Pseudomonas aeruginosa NOT DETECTED NOT DETECTED Final   Stenotrophomonas maltophilia NOT DETECTED NOT DETECTED Final   Candida albicans NOT DETECTED NOT DETECTED Final   Candida auris NOT DETECTED NOT DETECTED Final   Candida glabrata NOT DETECTED NOT DETECTED Final   Candida krusei NOT DETECTED NOT DETECTED Final   Candida parapsilosis NOT DETECTED NOT DETECTED Final   Candida tropicalis NOT DETECTED NOT DETECTED Final   Cryptococcus neoformans/gattii NOT DETECTED NOT DETECTED Final    Comment: Performed at [facility], [address] [place], [place] [address]   Radiology Studies: No results found.  Scheduled Meds:  amLODipine   2.5 mg Oral q AM   aspirin  81 mg Oral Daily   budesonide  (PULMICORT ) nebulizer solution  0.25 mg Nebulization BID   cyanocobalamin   500 mcg Oral Daily   enoxaparin  (LOVENOX ) injection  30 mg Subcutaneous Q24H   insulin aspart  0-9 Units Subcutaneous TID WC    losartan   50 mg Oral QPM   methylPREDNISolone  (SOLU-MEDROL ) injection  40 mg Intravenous Q12H   mirtazapine   15 mg Oral QHS   multivitamin with minerals  1 tablet Oral Daily   Continuous Infusions:  ampicillin-sulbactam (UNASYN) IV 3 g (03/22/24 0232)     LOS: 5 days    Time spent: 50 Mins    Darcel Dawley, MD [facility]   If 7PM-7AM, please contact night-coverage

## 2024-03-22 NOTE — Plan of Care (Signed)

## 2024-03-22 NOTE — Progress Notes (Addendum)
 OT Cancellation Note  Patient Details Name: Sandra Cooper MRN: [id number] DOB: 09-26-1929   Cancelled Treatment:    Reason Eval/Treat Not Completed: Patient declined, no reason specified;Other (comment) (pt requesting to eat breakfast first) pt greeted supine in bed with nurse present providing meds, pt requested to eat breakfast/take meds prior to session. Will continue efforts to see pt for OT session as time allows.   Returned at 55, pts son present with pt reporting someone else had already come to work with her for therapy. Will continue efforts as able.   Ronal Mallie POUR., COTA/L Acute Rehabilitation Services [phone]   Ronal Mallie Needy 03/22/2024, 8:22 AM

## 2024-03-22 NOTE — Significant Event (Signed)
 Rapid Response Event Note   Reason for Call :  CP/SOB  Initial Focused Assessment:  Pt lying in bed with eyes closed. She is pursed lip reathing. She is c/o 10/10 epigastric pain/pressure that radiates down her L arm and to her back. She is also c/o SOB but says that isn't new. Lungs clear t/o. Skin warm and dry.  T-98.2, HR-91, BP-222/110, RR-24, SpO2-98% on 2L [place].   Interventions:  EKG-NSR, Anterior/inferior infarct, age undetermined Trop STAT 1mg  morphine IV Plan of Care:  Pt reports pain decreasing after morphine administration. Await troponin results. Continue to monitor pt closely. Please call RRT if further assistance needed.   Event Summary:   MD Notified: Dr. Alfornia notified by bedside RN Call [phone] Arrival [phone] End [id number]  Tish Graeme Piety, RN

## 2024-03-23 ENCOUNTER — Other Ambulatory Visit: Payer: Self-pay | Admitting: Family Medicine

## 2024-03-23 DIAGNOSIS — J189 Pneumonia, unspecified organism: Secondary | ICD-10-CM | POA: Diagnosis not present

## 2024-03-23 DIAGNOSIS — M6281 Muscle weakness (generalized): Secondary | ICD-10-CM

## 2024-03-23 LAB — TROPONIN I (HIGH SENSITIVITY)
Troponin I (High Sensitivity): 14 ng/L (ref <18)
Troponin I (High Sensitivity): 14 ng/L (ref <18)

## 2024-03-23 LAB — GLUCOSE, CAPILLARY
Glucose-Capillary: 111 mg/dL — ABNORMAL HIGH (ref 70–99)
Glucose-Capillary: 151 mg/dL — ABNORMAL HIGH (ref 70–99)

## 2024-03-23 MED ORDER — HYDRALAZINE HCL 20 MG/ML IJ SOLN
5.0000 mg | Freq: Four times a day (QID) | INTRAMUSCULAR | Status: DC | PRN
  Administered 2024-03-23: 5 mg via INTRAVENOUS
  Filled 2024-03-23: qty 1

## 2024-03-23 MED ORDER — LOSARTAN POTASSIUM 50 MG PO TABS: 50.0000 mg | ORAL_TABLET | Freq: Every evening | ORAL | 0 refills | Status: AC

## 2024-03-23 MED ORDER — AMOXICILLIN-POT CLAVULANATE 875-125 MG PO TABS: 1.0000 | ORAL_TABLET | Freq: Two times a day (BID) | ORAL | 0 refills | Status: DC

## 2024-03-23 MED ORDER — AMOXICILLIN-POT CLAVULANATE 875-125 MG PO TABS: 1.0000 | ORAL_TABLET | Freq: Two times a day (BID) | ORAL | 0 refills | Status: AC

## 2024-03-23 NOTE — Discharge Instructions (Signed)
 Advised to follow-up with primary care physician in 1 week. Advised to take Augmentin 875 twice daily for 2 more days for aspiration pneumonia. Patient is being discharged to SNF for rehab.

## 2024-03-23 NOTE — Progress Notes (Signed)
 The patient's son called and states that the patient went to [facility] today for SNF placement and left AMA.  The patient chose to return home instead.  I called Dr. Leotis and he called the patient's Augmentin antibiotic into [facility].  I spoke with the son and he states that patient went home to her house and he was asking for home health services.  I provided choice regarding home health and son did not have a preference.  I called [facility] and cory, rNCM accepted for HH Pt, OT, aide.  Son was provided with resource to call [facility] home health if she needs personal care outside of home health.  Son was provided the number to call if needed.  Son is aware that [facility] will provide home health services and he plans to get the patient's medications from [facility] this evening.

## 2024-03-23 NOTE — Discharge Summary (Signed)
 Physician Discharge Summary  Sandra Cooper FMW:[id number] DOB: [birth year] DOA: 03/17/2024  PCP: Dorcus Lamar POUR, MD  Admit date: 03/17/2024  Discharge date: 03/23/2024  Admitted From: Home  Disposition:  SNF  Recommendations for Outpatient Follow-up:  Follow up with PCP in 1-2 weeks. Please obtain BMP/CBC in one week. Advised to take Augmentin 875 mg twice daily for 2 more days for aspiration pneumonia. Patient is being discharged to SNF for rehab.  Home Health: None Equipment/Devices: Home oxygen @ 1l/min  Discharge Condition: Stable CODE STATUS:Limited code Diet recommendation: Dysphagia III diet.  Brief Summary / Hospital Course: This 89 yrs old female with PMH significant for asthma, COPD, hypertension and history of lung cancer(adenocarcinoma) s/p radiation therapy who presented with shortness of breath.  Patient has recently completed antibiotics course for pneumonia. Her symptoms has gotten worse, She was evaluated by primary care physician and was noted to have tachypnea, hypoxemia with SpO2 of 88% on room air.  Patient was sent in the ED for further evaluation.CT chest ruled out pulmonary embolism but shows bilateral ground glass opacities, multifocal tree-in-bud nodularity, multiple rib fractures, ninth posterior lateral left rib is fragmented and irregular. Patient was admitted for further evaluation and started on bronchodilator therapy,  corticosteroids and IV antibiotics.  Patient has made significant improvement. She has received 5 days of IV Unasyn.  Speech and swallow evaluation recommended dysphagia 3 diet.  Patient was also continued on steroid and has received 5 days of Solu-Medrol .  She reports feeling much improved lungs sounded clear.  PT and OT recommended a skilled nursing facility.  Patient being discharged to SNF for rehabilitation.  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 Noted tree and bud changes on CT Chest,  possible aspiration pneumonia. PE ruled out. Complicated with sepsis, tachycardia and leukocytosis (sepsis has now resolved)  Continue supplemental oxygen and wean as tolerated. Wbc is 15.9, Patient has been afebrile.  Culture positive for gram positive cocci staphylococcus species (hominis), only in one bottle. (Likely contamination).  Continue Antibiotic therapy with Unasyn.  Swallow evaluation performed with recommendations for dysphagia diet 3. Patient discharged on Augmentin for 2 more days.   COPD with acute exacerbation: Continue bronchodilator therapy with albuterol  / Ipratropium. Continue Inhaled and systemic corticosteroids. Airway clearing techniques with flutter valve and incentive spirometer.  Continue supplemental 02 per Isabel to keep -02 saturation 92% or greater.  Today 02 saturation is 94% on 1 L/min per [place]    Hypertension: Continue amlodipine , losartan .   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  Follows once yearly with oncology. Ambulatory referral for oncology .   Depression: Continue with mirtazapine .   Steroid-induced hyperglycemia: Insulin sliding scale for glucose cover and monitoring  Fasting glucose this am 134 mg/dl. Hgb A1c 5.8 consistent with pre diabetes.   Discharge Instructions  Discharge Instructions     Call MD for:  difficulty breathing, headache or visual disturbances   Complete by: As directed    Call MD for:  persistant dizziness or light-headedness   Complete by: As directed    Call MD for:  persistant nausea and vomiting   Complete by: As directed  Diet  - low sodium heart healthy   Complete by: As directed    Discharge instructions   Complete by: As directed    Advised to follow-up with primary care physician in 1 week. Advised to take Augmentin 875 twice daily for 2 more days for aspiration pneumonia. Patient is being discharged to SNF for rehab.   Increase activity slowly   Complete by: As directed       Allergies as of 03/23/2024       Reactions   Fluoxetine Swelling, Other (See Comments)   Facial swelling and shakiness        Medication List     STOP taking these medications    ibuprofen  200 MG tablet Commonly known as: ADVIL        TAKE these medications    acetaminophen  500 MG tablet Commonly known as: TYLENOL  Take 500 mg by mouth every 6 (six) hours as needed for moderate pain (pain score 4-6) or mild pain (pain score 1-3).   AeroChamber MV inhaler Use as instructed   albuterol  108 (90 Base) MCG/ACT inhaler Commonly known as: VENTOLIN  HFA Inhale 2 puffs into the lungs every 6 (six) hours as needed for wheezing or shortness of breath.   amLODipine  2.5 MG tablet Commonly known as: NORVASC  Take 2.5 mg by mouth in the morning. Take 1 tablet by mouth every morning with 1 of the 5mg  tablets of Amlodipine  for a combined dose of 7.5mg  Amlodipine . What changed: Another medication with the same name was removed. Continue taking this medication, and follow the directions you see here.   amoxicillin-clavulanate 875-125 MG tablet Commonly known as: AUGMENTIN Take 1 tablet by mouth 2 (two) times daily for 2 days.   ASPIRIN PO Take 81 mg by mouth daily.   Calcium-Vitamin D 600-400 MG-UNIT Tabs Take 1 tablet by mouth daily.   EYE HEALTH FORMULA PO Take 1 capsule by mouth daily.   losartan  50 MG tablet Commonly known as: COZAAR  Take 1 tablet (50 mg total) by mouth every evening. What changed:  medication strength how much to take   magnesium  gluconate 500 MG tablet Commonly known as: MAGONATE Take 500 mg  by mouth daily.   meloxicam  15 MG tablet Commonly known as: MOBIC  Take 15 mg by mouth at bedtime.   mirtazapine  15 MG tablet Commonly known as: REMERON  Take 15 mg by mouth at bedtime.   multivitamin tablet Take 1 tablet by mouth daily.   OMEGA 3 PO Take 1 tablet by mouth daily. XL   OVER THE COUNTER MEDICATION Take 1 tablet by mouth See admin instructions. Life Extension, Brain Support Supplement tablets- Take 1 tablet by mouth once a day   polyethylene glycol 17 g packet Commonly known as: MIRALAX / GLYCOLAX Take 17 g by mouth daily as needed for mild constipation.   Potassium 99 MG Tabs Take 99 mg by mouth daily.   SIMETHICONE  PO Take 1 tablet by mouth with breakfast, with lunch, and with evening meal.   Trelegy Ellipta 200-62.5-25 MCG/ACT Aepb Generic drug: Fluticasone-Umeclidin-Vilant Inhale 1 puff into the lungs daily.   vitamin B-12 500 MCG tablet Commonly known as: CYANOCOBALAMIN  Take 500 mcg by mouth daily.   Vitamin D 50 MCG (2000 UT) Caps Take 2,000 Units by mouth daily.   zoledronic acid 5 MG/100ML Soln injection Commonly known as: RECLAST Inject 5 mg into the vein once. Once a year        Contact information for follow-up providers     Beam,  Lamar POUR, MD Follow up in 1 week(s).   Specialty: Family Medicine Contact information: [address] [place] [address] [phone]              Contact information for after-discharge care     Destination     [facility], [place] .   Service: Skilled Nursing Contact information: [address] [place]  [address] [phone]                    Allergies  Allergen Reactions   Fluoxetine Swelling and Other (See Comments)    Facial swelling and shakiness    Consultations: None   Procedures/Studies: VAS US  LOWER EXTREMITY VENOUS (DVT) (7a-7p) Result Date: 03/18/2024  Lower Venous DVT Study Patient Name:  ANADELIA KINTZ   Date of  Exam:   03/17/2024 Medical Rec #: [id number]  Accession #:    [id number] Date of Birth: [birth year]  Patient Gender: F Patient Age:   39 years Exam Location:  [facility] Procedure:      VAS US  LOWER EXTREMITY VENOUS (DVT) Referring Phys: CHRISTIAN PROSPERI --------------------------------------------------------------------------------  Indications: Pain of RLE x18 hours, and SOB.  Comparison Study: No previous RLEV exams. Previous LLEV on 11/09/2023 was                   negative for DVT Performing Technologist: Alberta Lis RVS  Examination Guidelines: A complete evaluation includes B-mode imaging, spectral Doppler, color Doppler, and power Doppler as needed of all accessible portions of each vessel. Bilateral testing is considered an integral part of a complete examination. Limited examinations for reoccurring indications may be performed as noted. The reflux portion of the exam is performed with the patient in reverse Trendelenburg.  +---------+---------------+---------+-----------+----------+--------------+ RIGHT    CompressibilityPhasicitySpontaneityPropertiesThrombus Aging +---------+---------------+---------+-----------+----------+--------------+ CFV      Full           Yes      Yes                                 +---------+---------------+---------+-----------+----------+--------------+ SFJ      Full                                                        +---------+---------------+---------+-----------+----------+--------------+ FV Prox  Full           Yes      Yes                                 +---------+---------------+---------+-----------+----------+--------------+ FV Mid   Full                                                        +---------+---------------+---------+-----------+----------+--------------+ FV DistalFull                                                        +---------+---------------+---------+-----------+----------+--------------+  PFV      Full                                                        +---------+---------------+---------+-----------+----------+--------------+ POP      Full           Yes      Yes                                 +---------+---------------+---------+-----------+----------+--------------+ PTV      Full                                                        +---------+---------------+---------+-----------+----------+--------------+ PERO     Full                                                        +---------+---------------+---------+-----------+----------+--------------+ ATV      Full                                                        +---------+---------------+---------+-----------+----------+--------------+   +----+---------------+---------+-----------+----------+--------------+ LEFTCompressibilityPhasicitySpontaneityPropertiesThrombus Aging +----+---------------+---------+-----------+----------+--------------+ CFV Full           Yes                                          +----+---------------+---------+-----------+----------+--------------+     Summary: RIGHT: - There is no evidence of deep vein thrombosis in the lower extremity.  - No cystic structure found in the popliteal fossa.  LEFT: - No evidence of common femoral vein obstruction.   *See table(s) above for measurements and observations. Electronically signed by Lonni Gaskins MD on 03/18/2024 at 9:33:34 AM.    Final    CT Angio Chest PE W and/or Wo Contrast Result Date: 03/17/2024 CLINICAL DATA:  Shortness of breath. Fever. Weakness. History of lung cancer. * Tracking Code: BO * EXAM: CT ANGIOGRAPHY CHEST WITH CONTRAST TECHNIQUE: Multidetector CT imaging of the chest was performed using the standard protocol during bolus administration of intravenous contrast. Multiplanar CT image reconstructions and MIPs were obtained to evaluate the vascular anatomy. RADIATION DOSE REDUCTION: This exam was  performed according to the departmental dose-optimization program which includes automated exposure control, adjustment of the mA and/or kV according to patient size and/or use of iterative reconstruction technique. CONTRAST:  75mL OMNIPAQUE  IOHEXOL  350 MG/ML SOLN COMPARISON:  Chest radiograph earlier today. Most recent CT of 06/16/2021. FINDINGS: Cardiovascular: The quality of this exam for evaluation of pulmonary embolism is good. No evidence of pulmonary embolism. Advanced aortic atherosclerosis. Tortuous thoracic aorta. Mild cardiomegaly, without pericardial effusion. Lad coronary artery calcification. Mediastinum/Nodes: Left-sided thyroid  nodule of 1.7 cm is similar on the  prior exam and is of doubtful clinical significance given patient age and comorbidities. Prominent middle mediastinal nodes, none pathologic by size criteria. No hilar adenopathy. Small hiatal hernia. Lungs/Pleura: No pleural fluid. Right lower lobe volume loss and subsegmental atelectasis. Multifocal primarily right-sided pneumonia. Posterior right upper lobe consolidation, relatively mild on 54/6. Multifocal areas of clustered tree-in-bud nodularity including the posterolateral right upper lobe on 46/6 and right lower lobe on 69/6. Relatively similar left lower lobe presumably treatment related scarring. The treated primary is relatively similar in morphology, measuring 3.2 x 2.5 cm on 77/6 versus 3.6 x 2.2 cm on the prior. Minimal right upper lobe nodularity is favored to be due to mucoid impaction on 32/6. Upper Abdomen: Segment 2 12 mm hepatic cyst. Normal imaged portions of the spleen, pancreas, adrenal glands, kidneys. Proximal gastric underdistention. Musculoskeletal: Again identified are multiple left rib fractures. The posterolateral left ninth rib is fragmented and irregular, at the site of a displaced fracture on 06/16/2021. Example image 80/5. Severe T3 compression deformity is similar, without significant ventral canal  encroachment. Review of the MIP images confirms the above findings. IMPRESSION: 1. No evidence of pulmonary embolism. 2. Multifocal, significantly right-greater-than-left pneumonia. 3. Relatively similar appearance and size of a treated left lower lobe primary. No typical findings of soft tissue metastasis. 4. Multiple left rib fractures again identified. The ninth posterolateral left rib is fragmented and irregular. Most likely related to incomplete healing and surrounding heterotopic ossification. Osseous metastasis cannot be entirely excluded, but felt less likely given lack of disease elsewhere. 5. Incidental findings, including: Coronary artery atherosclerosis. Aortic Atherosclerosis (ICD10-I70.0). Electronically Signed   By: Rockey Kilts M.D.   On: 03/17/2024 16:43   DG Chest Port 1 View Result Date: 03/17/2024 EXAM: 1 VIEW XRAY OF THE CHEST 03/17/2024 02:57:00 PM COMPARISON: 03/19/2022 CLINICAL HISTORY: Sepsis (HCC) [id number]. Sepsis FINDINGS: LUNGS AND PLEURA: Stable left basilar density is noted consistent with scarring. Mild right mid lung scarring or subsegmental atelectasis is noted. No pulmonary edema. No pleural effusion. No pneumothorax. HEART AND MEDIASTINUM: Stable cardiomediastinal silhouette. BONES AND SOFT TISSUES: Possible destructive lesion seen involving lateral portion of left eighth rib concerning for metastatic disease. IMPRESSION: 1. Possible destructive lesion involving the lateral portion of the left eighth rib, concerning for metastatic disease. 2. Stable left basilar scarring. 3. Mild right mid lung scarring or subsegmental atelectasis. Electronically signed by: Lynwood Seip MD 03/17/2024 03:41 PM EDT RP Workstation: [id number]    Subjective: Patient was seen and examined at bedside.  Overnight events noted. Patient reports feeling much improved and wants to be discharged to SNF.  Discharge Exam: Vitals:   03/23/24 0808 03/23/24 0822  BP:  (!) 167/83  Pulse: 76 80  Resp:  16   Temp:  98.1 F (36.7 C)  SpO2: 96% 96%   Vitals:   03/23/24 0411 03/23/24 0544 03/23/24 0808 03/23/24 0822  BP: (!) 162/99 (!) 160/84  (!) 167/83  Pulse: 76 75 76 80  Resp: 20  16   Temp: 97.9 F (36.6 C)   98.1 F (36.7 C)  TempSrc: Oral     SpO2: 96% 97% 96% 96%  Weight:      Height:        General: Pt is alert, awake, not in acute distress Cardiovascular: RRR, S1/S2 +, no rubs, no gallops Respiratory: CTA bilaterally, no wheezing, no rhonchi Abdominal: Soft, NT, ND, bowel sounds + Extremities: no edema, no cyanosis    The results of significant diagnostics from this hospitalization (including  imaging, microbiology, ancillary and laboratory) are listed below for reference.     Microbiology: Recent Results (from the past 240 hours)  Resp panel by RT-PCR (RSV, Flu A&B, Covid) Anterior Nasal Swab     Status: None   Collection Time: 03/17/24  1:58 PM   Specimen: Anterior Nasal Swab  Result Value Ref Range Status   SARS Coronavirus 2 by RT PCR NEGATIVE NEGATIVE Final   Influenza A by PCR NEGATIVE NEGATIVE Final   Influenza B by PCR NEGATIVE NEGATIVE Final    Comment: (NOTE) The Xpert Xpress SARS-CoV-2/FLU/RSV plus assay is intended as an aid in the diagnosis of influenza from Nasopharyngeal swab specimens and should not be used as a sole basis for treatment. Nasal washings and aspirates are unacceptable for Xpert Xpress SARS-CoV-2/FLU/RSV testing.  Fact Sheet for Patients: BloggerCourse.com  Fact Sheet for Healthcare Providers: SeriousBroker.it  This test is not yet approved or cleared by the [place]  FDA and has been authorized for detection and/or diagnosis of SARS-CoV-2 by FDA under an Emergency Use Authorization (EUA). This EUA will remain in effect (meaning this test can be used) for the duration of the COVID-19 declaration under Section 564(b)(1) of the Act, 21 U.S.C. section 360bbb-3(b)(1), unless  the authorization is terminated or revoked.     Resp Syncytial Virus by PCR NEGATIVE NEGATIVE Final    Comment: (NOTE) Fact Sheet for Patients: BloggerCourse.com  Fact Sheet for Healthcare Providers: SeriousBroker.it  This test is not yet approved or cleared by the [place]  FDA and has been authorized for detection and/or diagnosis of SARS-CoV-2 by FDA under an Emergency Use Authorization (EUA). This EUA will remain in effect (meaning this test can be used) for the duration of the COVID-19 declaration under Section 564(b)(1) of the Act, 21 U.S.C. section 360bbb-3(b)(1), unless the authorization is terminated or revoked.  Performed at [facility], [address] [place], [place] [address]   Blood Culture (routine x 2)     Status: None   Collection Time: 03/17/24  2:07 PM   Specimen: BLOOD  Result Value Ref Range Status   Specimen Description BLOOD LEFT ANTECUBITAL  Final   Special Requests   Final    BOTTLES DRAWN AEROBIC AND ANAEROBIC Blood Culture adequate volume   Culture   Final    NO GROWTH 5 DAYS Performed at [facility], [address], [place], [place] [address]    Report Status 03/22/2024 FINAL  Final  Blood Culture (routine x 2)     Status: Abnormal   Collection Time: 03/17/24  2:50 PM   Specimen: Right Antecubital; Blood  Result Value Ref Range Status   Specimen Description RIGHT ANTECUBITAL BLOOD  Final   Special Requests   Final    BOTTLES DRAWN AEROBIC AND ANAEROBIC Blood Culture adequate volume   Culture  Setup Time   Final    GRAM POSITIVE COCCI AEROBIC BOTTLE ONLY CRITICAL RESULT CALLED TO, READ BACK BY AND VERIFIED WITH: PHARMD LISA C. [id number] AT 1757, ADC    Culture (A)  Final    STAPHYLOCOCCUS HOMINIS THE SIGNIFICANCE OF ISOLATING THIS ORGANISM FROM A SINGLE SET OF BLOOD CULTURES WHEN MULTIPLE SETS ARE DRAWN IS UNCERTAIN. PLEASE NOTIFY THE MICROBIOLOGY DEPARTMENT WITHIN ONE WEEK IF  SPECIATION AND SENSITIVITIES ARE REQUIRED. Performed at [facility], [address] [place], [place] [address]    Report Status 03/19/2024 FINAL  Final  Blood Culture ID Panel (Reflexed)     Status: Abnormal  Collection Time: 03/17/24  2:50 PM  Result Value Ref Range Status   Enterococcus faecalis NOT DETECTED NOT DETECTED Final   Enterococcus Faecium NOT DETECTED NOT DETECTED Final   Listeria monocytogenes NOT DETECTED NOT DETECTED Final   Staphylococcus species DETECTED (A) NOT DETECTED Final    Comment: CRITICAL RESULT CALLED TO, READ BACK BY AND VERIFIED WITH: PHARMD LISA C. [id number] AT 1757, ADC    Staphylococcus aureus (BCID) NOT DETECTED NOT DETECTED Final   Staphylococcus epidermidis NOT DETECTED NOT DETECTED Final   Staphylococcus lugdunensis NOT DETECTED NOT DETECTED Final   Streptococcus species NOT DETECTED NOT DETECTED Final   Streptococcus agalactiae NOT DETECTED NOT DETECTED Final   Streptococcus pneumoniae NOT DETECTED NOT DETECTED Final   Streptococcus pyogenes NOT DETECTED NOT DETECTED Final   A.calcoaceticus-baumannii NOT DETECTED NOT DETECTED Final   Bacteroides fragilis NOT DETECTED NOT DETECTED Final   Enterobacterales NOT DETECTED NOT DETECTED Final   Enterobacter cloacae complex NOT DETECTED NOT DETECTED Final   Escherichia coli NOT DETECTED NOT DETECTED Final   Klebsiella aerogenes NOT DETECTED NOT DETECTED Final   Klebsiella oxytoca NOT DETECTED NOT DETECTED Final   Klebsiella pneumoniae NOT DETECTED NOT DETECTED Final   Proteus species NOT DETECTED NOT DETECTED Final   Salmonella species NOT DETECTED NOT DETECTED Final   Serratia marcescens NOT DETECTED NOT DETECTED Final   Haemophilus influenzae NOT DETECTED NOT DETECTED Final   Neisseria meningitidis NOT DETECTED NOT DETECTED Final   Pseudomonas aeruginosa NOT DETECTED NOT DETECTED Final   Stenotrophomonas maltophilia NOT DETECTED NOT DETECTED Final   Candida albicans NOT DETECTED NOT DETECTED  Final   Candida auris NOT DETECTED NOT DETECTED Final   Candida glabrata NOT DETECTED NOT DETECTED Final   Candida krusei NOT DETECTED NOT DETECTED Final   Candida parapsilosis NOT DETECTED NOT DETECTED Final   Candida tropicalis NOT DETECTED NOT DETECTED Final   Cryptococcus neoformans/gattii NOT DETECTED NOT DETECTED Final    Comment: Performed at [facility], [address] [place], [place] [address]     Labs: BNP (last 3 results) Recent Labs    03/17/24 1535  BNP 219.8*   Basic Metabolic Panel: Recent Labs  Lab 03/17/24 1407 03/17/24 2329 03/18/24 0142 03/19/24 0534 03/20/24 0455 03/22/24 0436  NA 136  --  139 139 138 137  K 4.3  --  3.7 4.1 4.2 4.4  CL 102  --  104 107 107 104  CO2 24  --  24 21* 22 23  GLUCOSE 130*  --  336* 161* 171* 138*  BUN 18  --  18 36* 42* 38*  CREATININE 0.95 1.08* 1.07* 1.11* 1.00 0.92  CALCIUM 9.5  --  9.0 9.4 8.8* 9.2   Liver Function Tests: Recent Labs  Lab 03/17/24 1407  AST 25  ALT 22  ALKPHOS 72  BILITOT 0.7  PROT 6.8  ALBUMIN 3.3*   No results for input(s): LIPASE, AMYLASE in the last 168 hours. No results for input(s): AMMONIA in the last 168 hours. CBC: Recent Labs  Lab 03/17/24 1407 03/17/24 2329 03/18/24 0142 03/19/24 0534 03/20/24 0455 03/21/24 0430 03/22/24 0436  WBC 22.7*   < > 22.3* 21.2* 15.9* 12.1* 16.9*  NEUTROABS 20.1*  --   --   --   --   --   --   HGB 14.1   < > 12.1 11.5* 11.3* 12.2 14.3  HCT 42.5   < > 37.1 35.5* 35.2* 38.5 44.8  MCV 88.4   < >  90.0 89.9 89.6 89.3 90.0  PLT 439*   < > 382 381 412* 470* 575*   < > = values in this interval not displayed.   Cardiac Enzymes: No results for input(s): CKTOTAL, CKMB, CKMBINDEX, TROPONINI in the last 168 hours. BNP: Invalid input(s): POCBNP CBG: Recent Labs  Lab 03/22/24 0804 03/22/24 1208 03/22/24 1609 03/22/24 2044 03/23/24 0747  GLUCAP 127* 125* 169* 130* 111*   D-Dimer No results for input(s): DDIMER in the  last 72 hours. Hgb A1c No results for input(s): HGBA1C in the last 72 hours. Lipid Profile No results for input(s): CHOL, HDL, LDLCALC, TRIG, CHOLHDL, LDLDIRECT in the last 72 hours. Thyroid  function studies No results for input(s): TSH, T4TOTAL, T3FREE, THYROIDAB in the last 72 hours.  Invalid input(s): FREET3 Anemia work up No results for input(s): VITAMINB12, FOLATE, FERRITIN, TIBC, IRON, RETICCTPCT in the last 72 hours. Urinalysis    Component Value Date/Time   COLORURINE YELLOW 01/31/2019 1230   APPEARANCEUR CLEAR 01/31/2019 1230   LABSPEC 1.016 01/31/2019 1230   PHURINE 7.0 01/31/2019 1230   GLUCOSEU NEGATIVE 01/31/2019 1230   HGBUR NEGATIVE 01/31/2019 1230   BILIRUBINUR NEGATIVE 01/31/2019 1230   KETONESUR NEGATIVE 01/31/2019 1230   PROTEINUR NEGATIVE 01/31/2019 1230   NITRITE NEGATIVE 01/31/2019 1230   LEUKOCYTESUR NEGATIVE 01/31/2019 1230   Sepsis Labs Recent Labs  Lab 03/19/24 0534 03/20/24 0455 03/21/24 0430 03/22/24 0436  WBC 21.2* 15.9* 12.1* 16.9*   Microbiology Recent Results (from the past 240 hours)  Resp panel by RT-PCR (RSV, Flu A&B, Covid) Anterior Nasal Swab     Status: None   Collection Time: 03/17/24  1:58 PM   Specimen: Anterior Nasal Swab  Result Value Ref Range Status   SARS Coronavirus 2 by RT PCR NEGATIVE NEGATIVE Final   Influenza A by PCR NEGATIVE NEGATIVE Final   Influenza B by PCR NEGATIVE NEGATIVE Final    Comment: (NOTE) The Xpert Xpress SARS-CoV-2/FLU/RSV plus assay is intended as an aid in the diagnosis of influenza from Nasopharyngeal swab specimens and should not be used as a sole basis for treatment. Nasal washings and aspirates are unacceptable for Xpert Xpress SARS-CoV-2/FLU/RSV testing.  Fact Sheet for Patients: BloggerCourse.com  Fact Sheet for Healthcare Providers: SeriousBroker.it  This test is not yet approved or cleared by the  [place]  FDA and has been authorized for detection and/or diagnosis of SARS-CoV-2 by FDA under an Emergency Use Authorization (EUA). This EUA will remain in effect (meaning this test can be used) for the duration of the COVID-19 declaration under Section 564(b)(1) of the Act, 21 U.S.C. section 360bbb-3(b)(1), unless the authorization is terminated or revoked.     Resp Syncytial Virus by PCR NEGATIVE NEGATIVE Final    Comment: (NOTE) Fact Sheet for Patients: BloggerCourse.com  Fact Sheet for Healthcare Providers: SeriousBroker.it  This test is not yet approved or cleared by the [place]  FDA and has been authorized for detection and/or diagnosis of SARS-CoV-2 by FDA under an Emergency Use Authorization (EUA). This EUA will remain in effect (meaning this test can be used) for the duration of the COVID-19 declaration under Section 564(b)(1) of the Act, 21 U.S.C. section 360bbb-3(b)(1), unless the authorization is terminated or revoked.  Performed at [facility], [address] [place], [place] [address]   Blood Culture (routine x 2)     Status: None   Collection Time: 03/17/24  2:07 PM   Specimen: BLOOD  Result Value Ref Range Status   Specimen Description BLOOD  LEFT ANTECUBITAL  Final   Special Requests   Final    BOTTLES DRAWN AEROBIC AND ANAEROBIC Blood Culture adequate volume   Culture   Final    NO GROWTH 5 DAYS Performed at [facility], [address], [place], [place] [address]    Report Status 03/22/2024 FINAL  Final  Blood Culture (routine x 2)     Status: Abnormal   Collection Time: 03/17/24  2:50 PM   Specimen: Right Antecubital; Blood  Result Value Ref Range Status   Specimen Description RIGHT ANTECUBITAL BLOOD  Final   Special Requests   Final    BOTTLES DRAWN AEROBIC AND ANAEROBIC Blood Culture adequate volume   Culture  Setup Time   Final    GRAM POSITIVE COCCI AEROBIC BOTTLE  ONLY CRITICAL RESULT CALLED TO, READ BACK BY AND VERIFIED WITH: PHARMD LISA C. [id number] AT 1757, ADC    Culture (A)  Final    STAPHYLOCOCCUS HOMINIS THE SIGNIFICANCE OF ISOLATING THIS ORGANISM FROM A SINGLE SET OF BLOOD CULTURES WHEN MULTIPLE SETS ARE DRAWN IS UNCERTAIN. PLEASE NOTIFY THE MICROBIOLOGY DEPARTMENT WITHIN ONE WEEK IF SPECIATION AND SENSITIVITIES ARE REQUIRED. Performed at [facility], [address] [place], [place] [address]    Report Status 03/19/2024 FINAL  Final  Blood Culture ID Panel (Reflexed)     Status: Abnormal   Collection Time: 03/17/24  2:50 PM  Result Value Ref Range Status   Enterococcus faecalis NOT DETECTED NOT DETECTED Final   Enterococcus Faecium NOT DETECTED NOT DETECTED Final   Listeria monocytogenes NOT DETECTED NOT DETECTED Final   Staphylococcus species DETECTED (A) NOT DETECTED Final    Comment: CRITICAL RESULT CALLED TO, READ BACK BY AND VERIFIED WITH: PHARMD LISA C. [id number] AT 1757, ADC    Staphylococcus aureus (BCID) NOT DETECTED NOT DETECTED Final   Staphylococcus epidermidis NOT DETECTED NOT DETECTED Final   Staphylococcus lugdunensis NOT DETECTED NOT DETECTED Final   Streptococcus species NOT DETECTED NOT DETECTED Final   Streptococcus agalactiae NOT DETECTED NOT DETECTED Final   Streptococcus pneumoniae NOT DETECTED NOT DETECTED Final   Streptococcus pyogenes NOT DETECTED NOT DETECTED Final   A.calcoaceticus-baumannii NOT DETECTED NOT DETECTED Final   Bacteroides fragilis NOT DETECTED NOT DETECTED Final   Enterobacterales NOT DETECTED NOT DETECTED Final   Enterobacter cloacae complex NOT DETECTED NOT DETECTED Final   Escherichia coli NOT DETECTED NOT DETECTED Final   Klebsiella aerogenes NOT DETECTED NOT DETECTED Final   Klebsiella oxytoca NOT DETECTED NOT DETECTED Final   Klebsiella pneumoniae NOT DETECTED NOT DETECTED Final   Proteus species NOT DETECTED NOT DETECTED Final   Salmonella species NOT DETECTED NOT DETECTED Final    Serratia marcescens NOT DETECTED NOT DETECTED Final   Haemophilus influenzae NOT DETECTED NOT DETECTED Final   Neisseria meningitidis NOT DETECTED NOT DETECTED Final   Pseudomonas aeruginosa NOT DETECTED NOT DETECTED Final   Stenotrophomonas maltophilia NOT DETECTED NOT DETECTED Final   Candida albicans NOT DETECTED NOT DETECTED Final   Candida auris NOT DETECTED NOT DETECTED Final   Candida glabrata NOT DETECTED NOT DETECTED Final   Candida krusei NOT DETECTED NOT DETECTED Final   Candida parapsilosis NOT DETECTED NOT DETECTED Final   Candida tropicalis NOT DETECTED NOT DETECTED Final   Cryptococcus neoformans/gattii NOT DETECTED NOT DETECTED Final    Comment: Performed at [facility], [address], [place], [place] [address]     Time coordinating discharge: Over 30 minutes  SIGNED:   Darcel  Leotis, MD  [facility] 03/23/2024, 10:55 AM Pager   If 7PM-7AM, please contact night-coverage

## 2024-03-23 NOTE — TOC Transition Note (Signed)
 Transition of Care [facility]) - Discharge Note   Patient Details  Name: Sandra Cooper MRN: [id number] Date of Birth: 23-May-1930  Transition of [facility]) CM/SW Contact:  Sherline Clack, LCSWA Phone Number: 03/23/2024, 1:02 PM   Clinical Narrative:     Patient will DC to: [facility] Anticipated DC date: 03/23/24  Family notified: Omar/son Transport by: [facility] Insurance: approved 10/22-10/24, shara ID: [id number]   Per MD patient ready for DC to [facility]. RN to call report prior to discharge [phone], rm [phone]). RN, patient, patient's family, and facility notified of DC. Discharge Summary and FL2 sent to facility. DC packet on chart. Ambulance transport requested for patient.   CSW will sign off for now as social work intervention is no longer needed. Please consult us  again if new needs arise.    Final next level of care: Skilled Nursing Facility Barriers to Discharge: Barriers Resolved   Patient Goals and CMS Choice     Choice offered to / list presented to : Patient, Adult Children      Discharge Placement              Patient chooses bed at: [facility] Patient to be transferred to facility by: [facility] Name of family member notified: Omar/son Patient and family notified of of transfer: 03/23/24  Discharge Plan and Services Additional resources added to the After Visit Summary for                                       Social Drivers of Health (SDOH) Interventions SDOH Screenings   Food Insecurity: No Food Insecurity (03/17/2024)  Housing: Low Risk  (03/17/2024)  Transportation Needs: No Transportation Needs (03/17/2024)  Utilities: Not At Risk (03/17/2024)  Financial Resource Strain: Low Risk  (02/29/2024)   Received from [facility]  Physical Activity: Inactive (02/29/2024)   Received from [facility]  Social Connections: Moderately Isolated (03/17/2024)  Stress: No Stress Concern Present (02/29/2024)   Received from [facility]  Tobacco Use:  Low Risk  (03/17/2024)  Health Literacy: High Risk (06/22/2023)   [facility]     Readmission Risk Interventions    03/20/2024   10:56 AM  Readmission Risk Prevention Plan  Transportation Screening Complete  PCP or Specialist Appt within 5-7 Days Complete  Home Care Screening Complete  Medication Review (RN CM) Complete

## 2024-03-23 NOTE — Progress Notes (Signed)
 Physical Therapy Treatment Patient Details Name: Sandra Cooper MRN: [id number] DOB: 11-06-1929 Today's Date: 03/23/2024   History of Present Illness 89 y.o. female presenting to the ED 10/17 from primary care appointment she presented with tachypnea and hypoxemia with 02 saturation 88% with minimal efforts, she had a rapid and progressive deterioration on her dyspnea three days ago at PT appointment. Continues to have right upper lobe PNA from 9/24 diagnosis, PMH: medical history significant of asthma, COPD, hypertension and history of lung cancer,(adenocarcinoma) sp radiation therapy    PT Comments  Received pt semi-reclined in bed. Attempt 1, pt eating breakfast and requested therapist return. Attempt 2, pt agreeable to PT treatment. Pt on 1L O2 with SPO2 92%, pt requested to remove O2 (stated she has been walking to bathroom without). On RA, SPO2 remained >90% with mobility. Pt performed bed mobility with HOB elevated and use of bedrails mod I with increased time. Pt required min A to stand from low sitting EOB with RW but able to stand from commode with supervision. Pt ambulated ~22ft with RW and CGA (unable to locate rollator), then in/out of bathroom with CGA. Pt required cues for RW safety, frequently letting go and reaching out for external support. Pt able to void and perform hygiene management without assist and stood at sink to perform hand hygiene with close supervision. Pt left in recliner with all needs within reach. Continue to recommend therapy (<3 hours/day) to address current deficits. Acute PT to cont to follow.    If plan is discharge home, recommend the following: A little help with walking and/or transfers;A little help with bathing/dressing/bathroom;Help with stairs or ramp for entrance;Assist for transportation   Can travel by private vehicle     Yes  Equipment Recommendations  None recommended by PT (TBD in next venue)    Recommendations for Other Services       Precautions  / Restrictions Precautions Precautions: Fall Precaution/Restrictions Comments: monitor O2 Restrictions Weight Bearing Restrictions Per Provider Order: No     Mobility  Bed Mobility Overal bed mobility: Modified Independent             General bed mobility comments: HBB elevated and use of bedrails with increased time but no physical assist required Patient Response: Cooperative  Transfers Overall transfer level: Needs assistance Equipment used: Rolling walker (2 wheels) Transfers: Sit to/from Stand Sit to Stand: Contact guard assist           General transfer comment: required min A to stand from low sitting EOB with RW but able to stand from commode with supervision    Ambulation/Gait Ambulation/Gait assistance: Contact guard assist Gait Distance (Feet): 50 Feet Assistive device: Rolling walker (2 wheels) Gait Pattern/deviations: Step-through pattern, Decreased step length - right, Decreased step length - left, Shuffle, Trunk flexed, Decreased stride length, Narrow base of support Gait velocity: decreased Gait velocity interpretation: <1.31 ft/sec, indicative of household ambulator   General Gait Details: required cues to keep both hands on RW, pt with tendency to reach out for external support with 1UE and keep other UE on RW   Stairs             Wheelchair Mobility     Tilt Bed Tilt Bed Patient Response: Cooperative  Modified Rankin (Stroke Patients Only)       Balance Overall balance assessment: Needs assistance Sitting-balance support: Feet supported, Bilateral upper extremity supported Sitting balance-Leahy Scale: Good     Standing balance support: Bilateral upper extremity supported, During functional  activity, Reliant on assistive device for balance, No upper extremity supported (RW) Standing balance-Leahy Scale: Fair Standing balance comment: able to maintain static standing balance with BUE support and close supervision but required CGA  for dynamic standing balance                            Communication Communication Communication: No apparent difficulties  Cognition Arousal: Alert Behavior During Therapy: WFL for tasks assessed/performed   PT - Cognitive impairments: No apparent impairments                         Following commands: Intact      Cueing Cueing Techniques: Verbal cues  Exercises      General Comments General comments (skin integrity, edema, etc.): Pt initally on 1L, titrated to RA and SPO2 remained >90% with activity      Pertinent Vitals/Pain Pain Assessment Pain Assessment: Faces Faces Pain Scale: Hurts a little bit Pain Location: low back Pain Descriptors / Indicators: Discomfort, Guarding Pain Intervention(s): Limited activity within patient's tolerance, Monitored during session, Premedicated before session, Repositioned    Home Living                          Prior Function            PT Goals (current goals can now be found in the care plan section) Acute Rehab PT Goals PT Goal Formulation: With patient Time For Goal Achievement: 04/01/24 Potential to Achieve Goals: Good Progress towards PT goals: Progressing toward goals    Frequency    Min 2X/week      PT Plan      Co-evaluation              AM-PAC PT 6 Clicks Mobility   Outcome Measure  Help needed turning from your back to your side while in a flat bed without using bedrails?: None Help needed moving from lying on your back to sitting on the side of a flat bed without using bedrails?: None Help needed moving to and from a bed to a chair (including a wheelchair)?: A Little Help needed standing up from a chair using your arms (e.g., wheelchair or bedside chair)?: A Little Help needed to walk in hospital room?: A Little Help needed climbing 3-5 steps with a railing? : A Lot 6 Click Score: 19    End of Session   Activity Tolerance: Patient tolerated treatment  well Patient left: with call bell/phone within reach;in chair   PT Visit Diagnosis: Unsteadiness on feet (R26.81);Muscle weakness (generalized) (M62.81);Difficulty in walking, not elsewhere classified (R26.2)     Time: 9068-9044 PT Time Calculation (min) (ACUTE ONLY): 24 min  Charges:    $Therapeutic Activity: 23-37 mins PT General Charges $$ ACUTE PT VISIT: 1 Visit                     Therisa Stains PT, DPT Therisa HERO Zaunegger 03/23/2024, 10:03 AM

## 2024-03-27 ENCOUNTER — Ambulatory Visit

## 2024-03-27 VITALS — BP 114/72 | HR 85 | Temp 98.3°F | Ht <= 58 in | Wt 115.0 lb

## 2024-03-27 DIAGNOSIS — Z923 Personal history of irradiation: Secondary | ICD-10-CM

## 2024-03-27 DIAGNOSIS — J189 Pneumonia, unspecified organism: Secondary | ICD-10-CM

## 2024-03-27 DIAGNOSIS — C3492 Malignant neoplasm of unspecified part of left bronchus or lung: Secondary | ICD-10-CM

## 2024-03-27 DIAGNOSIS — S2242XA Multiple fractures of ribs, left side, initial encounter for closed fracture: Secondary | ICD-10-CM

## 2024-03-27 DIAGNOSIS — R1310 Dysphagia, unspecified: Secondary | ICD-10-CM

## 2024-03-27 DIAGNOSIS — J4540 Moderate persistent asthma, uncomplicated: Secondary | ICD-10-CM | POA: Diagnosis not present

## 2024-03-27 DIAGNOSIS — Z09 Encounter for follow-up examination after completed treatment for conditions other than malignant neoplasm: Secondary | ICD-10-CM

## 2024-03-27 MED ORDER — TRELEGY ELLIPTA 200-62.5-25 MCG/ACT IN AEPB: 1.0000 | INHALATION_SPRAY | Freq: Every day | RESPIRATORY_TRACT | 5 refills | Status: AC

## 2024-03-27 NOTE — Patient Instructions (Addendum)
 It was a pleasure to see you today. Your will receive a call for your PET scan scheduling.  New scripts for trelegy has been ordered GI referral has been sent, expect a call from the gastroenterologists' office in few weeks

## 2024-03-27 NOTE — Assessment & Plan Note (Addendum)
 Sandra Cooper

## 2024-03-27 NOTE — Progress Notes (Unsigned)
 New Patient Pulmonology Office Visit   Subjective:  Patient ID: Sandra Cooper, female    DOB: 1930-03-01  MRN: [id number]  Referred by: Sophronia Ozell BROCKS, MD  CC:  Chief Complaint  Patient presents with   Asthma    History of asthma, bronchitis and copd    Shortness of Breath     Recovering from Pneumonia (September 20).    HPI Sandra Cooper is a 89 y.o. female who is here to re-establish after a recent hospitalization for pneumonia.  History of Present Illness Pt has seen Dr Shellia in this clinic in 2020 for asthma, left lung nodule stage IA adenocarcinoma. Followed with Dr Sherrod and received radiation therapy under the care of Dr PATRCIA Previously was on home oxygen which was discontinued based on [employer]  She had recently hospital admission for pneumonia, required oxygen while inpatient Didn't need oxygen at the time of discharge CT scan showed Multifocal, significantly right-greater-than-left pneumonia.  It also showed multiple rib fractures, the left 9th rib was fragmented and irregular- osseous mets can't be entirely excluded Hasn't followed up with oncology in recent years. Last scans in 2023 showed multiple new rib fractures acute/subacute and small left effusion and no significant change on known treated left cancer  Family reports she had a fall while in [place] prior to this recent admission  She was treated with over 10 days of antibiotics She reports improvement since then but still has significant fatigue, denies fever, productive sputum Starting home PT  Family concerned for dysphagia Hx concerning for  aspiration No hx of recurrent pneumonia in the past Pt feels her esophagus is narrow  She has hx of asthma and takes 100 trelegy daily  She was in the appointment with her son and daughter. Non smoker       ROS Negative except mentioned above  Allergies: Fluoxetine No current facility-administered medications for this visit.  Current Outpatient Medications:     albuterol  (VENTOLIN  HFA) 108 (90 Base) MCG/ACT inhaler, Inhale 2 puffs into the lungs every 6 (six) hours as needed for wheezing or shortness of breath., Disp: 6.7 g, Rfl: 2   amLODipine  (NORVASC ) 2.5 MG tablet, Take 2.5 mg by mouth daily., Disp: , Rfl:    Calcium Carb-Cholecalciferol (CALCIUM-VITAMIN D) 600-400 MG-UNIT TABS, Take 1 tablet by mouth daily., Disp: , Rfl:    Cholecalciferol (VITAMIN D) 50 MCG (2000 UT) CAPS, Take 2,000 Units by mouth daily., Disp: , Rfl:    losartan  (COZAAR ) 50 MG tablet, Take 1 tablet (50 mg total) by mouth every evening. (Patient taking differently: Take 50 mg by mouth at bedtime.), Disp: 30 tablet, Rfl: 0   magnesium  gluconate (MAGONATE) 500 MG tablet, Take 500 mg by mouth daily., Disp: , Rfl:    mirtazapine  (REMERON ) 15 MG tablet, Take 15 mg by mouth at bedtime., Disp: , Rfl:    Multiple Vitamin (MULTIVITAMIN) tablet, Take 1 tablet by mouth daily., Disp: , Rfl:    Omega-3 Fatty Acids (OMEGA 3 PO), Take 1 tablet by mouth daily., Disp: , Rfl:    OVER THE COUNTER MEDICATION, Take 1 tablet by mouth daily. Life Extension, Brain Support Supplement, Disp: , Rfl:    polyethylene glycol (MIRALAX / GLYCOLAX) 17 g packet, Take 17 g by mouth daily as needed for mild constipation., Disp: , Rfl:    Potassium 99 MG TABS, Take 99 mg by mouth daily., Disp: , Rfl:    SIMETHICONE  PO, Take 1 tablet by mouth with breakfast, with lunch, and with  evening meal., Disp: , Rfl:    zoledronic acid (RECLAST) 5 MG/100ML SOLN injection, Inject 5 mg into the vein once. Once a year, Disp: , Rfl:    amLODipine  (NORVASC ) 5 MG tablet, Take 5 mg by mouth daily., Disp: , Rfl:    aspirin EC 81 MG tablet, Take 81 mg by mouth daily. Swallow whole., Disp: , Rfl:    ibuprofen  (ADVIL ) 200 MG tablet, Take 800 mg by mouth daily as needed for mild pain (pain score 1-3) or moderate pain (pain score 4-6)., Disp: , Rfl:    meloxicam  (MOBIC ) 15 MG tablet, Take 15 mg by mouth at bedtime. (Patient not taking: No sig  reported), Disp: , Rfl:    pantoprazole (PROTONIX) 40 MG tablet, Take 40 mg by mouth daily. (Patient not taking: Reported on 03/29/2024), Disp: , Rfl:    TRELEGY ELLIPTA 200-62.5-25 MCG/ACT AEPB, Inhale 1 puff into the lungs daily., Disp: 60 each, Rfl: 5   vitamin B-12 (CYANOCOBALAMIN ) 500 MCG tablet, Take 500 mcg by mouth daily., Disp: , Rfl:   Facility-Administered Medications Ordered in Other Visits:    acetaminophen  (TYLENOL ) tablet 650 mg, 650 mg, Oral, Q6H PRN **OR** acetaminophen  (TYLENOL ) suppository 650 mg, 650 mg, Rectal, Q6H PRN, Smith, Rondell A, MD   albuterol  (PROVENTIL ) (2.5 MG/3ML) 0.083% nebulizer solution 2.5 mg, 2.5 mg, Nebulization, Q6H PRN, Smith, Rondell A, MD   heparin ADULT infusion 100 units/mL (25000 units/250mL), 850 Units/hr, Intravenous, Continuous, Gaines Carrier, RPH, Last Rate: 8.5 mL/hr at 03/29/24 0957, 850 Units/hr at 03/29/24 0957   HYDROcodone -acetaminophen  (NORCO/VICODIN) 5-325 MG per tablet 1 tablet, 1 tablet, Oral, Q6H PRN, Claudene, Rondell A, MD   losartan  (COZAAR ) tablet 50 mg, 50 mg, Oral, QHS, Smith, Rondell A, MD   mirtazapine  (REMERON ) tablet 15 mg, 15 mg, Oral, QHS, Smith, Rondell A, MD   morphine (PF) 2 MG/ML injection 2 mg, 2 mg, Intravenous, Q4H PRN, Smith, Rondell A, MD   ondansetron  (ZOFRAN ) tablet 4 mg, 4 mg, Oral, Q6H PRN **OR** ondansetron  (ZOFRAN ) injection 4 mg, 4 mg, Intravenous, Q6H PRN, Smith, Rondell A, MD   pantoprazole (PROTONIX) EC tablet 40 mg, 40 mg, Oral, Daily, Smith, Rondell A, MD   polyethylene glycol (MIRALAX / GLYCOLAX) packet 17 g, 17 g, Oral, Daily PRN, Claudene, Rondell A, MD   simethicone  (MYLICON) chewable tablet 80 mg, 80 mg, Oral, QID PRN, Claudene, Rondell A, MD   sodium chloride  flush (NS) 0.9 % injection 3 mL, 3 mL, Intravenous, Q12H, Smith, Rondell A, MD, 3 mL at 03/29/24 9040 Past Medical History:  Diagnosis Date   Arthritis    Asthma    Hypertension    Lung cancer (HCC)    adenocarcinoma LLL   Past Surgical  History:  Procedure Laterality Date   APPENDECTOMY     CT guided lung biopsy     HERNIA REPAIR     I & D EXTREMITY Left 08/26/2020   Procedure: IRRIGATION AND DEBRIDEMENT EXTREMITY;  Surgeon: Lorretta Dess, MD;  Location: [facility];  Service: Plastics;  Laterality: Left;   ORIF WRIST FRACTURE Left 08/26/2020   Procedure: OPEN REDUCTION INTERNAL FIXATION (ORIF) WRIST FRACTURE;  Surgeon: Lorretta Dess, MD;  Location: [facility];  Service: Plastics;  Laterality: Left;   Family History  Problem Relation Age of Onset   Cancer Neg Hx    Social History   Socioeconomic History   Marital status: Widowed    Spouse name: Not on file   Number of children: Not on file  Years of education: Not on file   Highest education level: Not on file  Occupational History    Comment: retired  Tobacco Use   Smoking status: Never   Smokeless tobacco: Never   Tobacco comments:    Exposed to second hand smoke  Vaping Use   Vaping status: Never Used  Substance and Sexual Activity   Alcohol use: Yes   Drug use: Never   Sexual activity: Not Currently  Other Topics Concern   Not on file  Social History Narrative   Not on file   Social Drivers of Health   Financial Resource Strain: Low Risk  (02/29/2024)   Received from [facility]   Overall Financial Resource Strain (CARDIA)    How hard is it for you to pay for the very basics like food, housing, medical care, and heating?: Not hard at all  Food Insecurity: No Food Insecurity (03/17/2024)   Hunger Vital Sign    Worried About Running Out of Food in the Last Year: Never true    Ran Out of Food in the Last Year: Never true  Transportation Needs: No Transportation Needs (03/17/2024)   PRAPARE - Administrator, Civil Service (Medical): No    Lack of Transportation (Non-Medical): No  Physical Activity: Inactive (02/29/2024)   Received from [facility]   Exercise Vital Sign    On average, how many days per week do you engage in moderate to  strenuous exercise (like a brisk walk)?: 0 days    Minutes of Exercise per Session: Not on file  Stress: No Stress Concern Present (02/29/2024)   Received from [facility] - Occupational Stress Questionnaire    Do you feel stress - tense, restless, nervous, or anxious, or unable to sleep at night because your mind is troubled all the time - these days?: Not at all  Social Connections: Moderately Isolated (03/17/2024)   Social Connection and Isolation Panel    Frequency of Communication with Friends and Family: More than three times a week    Frequency of Social Gatherings with Friends and Family: More than three times a week    Attends Religious Services: More than 4 times per year    Active Member of [employer] or Organizations: No    Attends Banker Meetings: Never    Marital Status: Widowed  Intimate Partner Violence: Not At Risk (03/17/2024)   Humiliation, Afraid, Rape, and Kick questionnaire    Fear of Current or Ex-Partner: No    Emotionally Abused: No    Physically Abused: No    Sexually Abused: No         Objective:  BP 114/72   Pulse 85   Temp 98.3 F (36.8 C) (Oral)   Ht 4' 10 (1.473 m)   Wt 115 lb (52.2 kg)   SpO2 93%   BMI 24.04 kg/m    Physical Exam Constitutional:      General: She is not in acute distress.    Appearance: Normal appearance.     Comments: Frail looking, asking appropriate questions Speech somewhat slow  HENT:     Mouth/Throat:     Mouth: Mucous membranes are moist.  Cardiovascular:     Rate and Rhythm: Normal rate.  Pulmonary:     Effort: No respiratory distress.     Breath sounds: Examination of the right-lower field reveals rales. Examination of the left-lower field reveals rales. Rales present. No wheezing.  Musculoskeletal:  Right lower leg: No edema.     Left lower leg: No edema.  Skin:    General: Skin is warm.  Neurological:     Mental Status: She is alert and oriented  to person, place, and time.  Psychiatric:        Mood and Affect: Mood normal.     Diagnostic Review:    Pft    Latest Ref Rng & Units 02/16/2019   12:10 PM  PFT Results  FVC-Pre L 1.70   FVC-Predicted Pre % 97   FVC-Post L 1.75   FVC-Predicted Post % 99   Pre FEV1/FVC % % 69   Post FEV1/FCV % % 75   FEV1-Pre L 1.17   FEV1-Predicted Pre % 92   FEV1-Post L 1.30   DLCO uncorrected ml/min/mmHg 12.23   DLCO UNC% % 79   DLCO corrected ml/min/mmHg 11.98   DLCO COR %Predicted % 77   DLVA Predicted % 87   TLC L 7.88   TLC % Predicted % 183   RV % Predicted % 268    CT chest 03/2024 1. No evidence of pulmonary embolism. 2. Multifocal, significantly right-greater-than-left pneumonia. 3. Relatively similar appearance and size of a treated left lower lobe primary. No typical findings of soft tissue metastasis. 4. Multiple left rib fractures again identified. The ninth posterolateral left rib is fragmented and irregular. Most likely related to incomplete healing and surrounding heterotopic ossification. Osseous metastasis cannot be entirely excluded, but felt less likely given lack of disease elsewhere. 5. Incidental findings, including: Coronary artery atherosclerosis. Aortic Atherosclerosis (ICD10-I70.0).  CT chest 06/2021 1. Multiple new left-sided rib fractures which appear acute/subacute. Associated small left pleural effusion. No pneumothorax. 2. No significant change in appearance of treated left lower lobe pulmonary nodule allowing for superimposed atelectasis from the rib fractures. No evidence of metastatic disease. 3. Extensive Aortic Atherosclerosis    Reviewed hospital admission and discharge notes Reviewed PCP notes 06/2023  PFT 20202 Positive BD response TLC 183, RV 268% DLCO 79%    Assessment & Plan:   Assessment & Plan Moderate persistent asthma, unspecified whether complicated On trelegy regularly Reports sub-optimal response Will increase  strength to 200/62.5/25  daily    Hospital discharge follow-up Gradually improving but not quite back to baseline Oxygen saturation stays more than 92% Reports fatigue and will be doing therapy    Community acquired pneumonia, unspecified laterality No infectious symptoms currently Appropriately treated with over 10 of total antibiotics course Will need follow up scans in 6-8 weeks to ensure complete resolution of the airspace infiltrates    Adenocarcinoma of left lung (HCC) Hasn't followed up with oncology since 2023 Left sided rib fracture since 2023 but The ninth posterolateral left rib is fragmented and irregular. Due to incomplete healing, there is a concern if this is metastatic disease  I offered follow up CT chest to ensure the pneumonia had resolved, however family requested to PET instead if there is any concern for cancer recurrence. I did advise pt/family to consider if she would be a candidate for any further cancer treatment if her imaging was suggested cancer recurrence or a new cancer Also advised pt to call oncology  office for further discussion but I didl order PET/CT that should give enough information for follow up of recent pneumonia Orders:   NM PET Image Initial (PI) Skull Base To Thigh; Future  History of radiation therapy     Closed fracture of multiple ribs of left side, initial encounter Unsure  if related to fall Could be secondary    Dysphagia, unspecified type Discussed aspiration precautions Family concerned for dysphagia Pt feels her esophagus is narrow Orders:   Ambulatory referral to Gastroenterology    Thank you for the opportunity to take part in the care of Sandra Cooper   RTC 5- 6 weeks after PET/CT   Zymier Rodgers Pleas, MD [facility] Office: [phone]

## 2024-03-28 ENCOUNTER — Telehealth: Payer: Self-pay | Admitting: *Deleted

## 2024-03-28 NOTE — Telephone Encounter (Signed)
 Received call from pt's daughter, Sonny requesting urgent appt with Dr Sherrod.  She reports pt slipped & fell with her travels & fractured a rib.  She reports that pt has had a dormant cancer with mets.  She is having increased pain.  Please call Daughter @ [phone] with appt.  Message routed to Dr Sherrod & Schedulers.

## 2024-03-28 NOTE — Telephone Encounter (Signed)
 Called Leslie back & informed that that she should go to ED if recent fall.  Sonny explained that pt had fall while in [place] & has seen pulmonologist here since then & was also hospitalized.  Pt is concerned for mets b/c of some discomfort & CT scans were questionable.  She just wants to see Dr Sherrod for clarification.  Informed Sonny that Dr Sherrod would be glad to see her as soon as an opening available but doesn't look like opening available in next 2 wks.  Suggested she maybe see PA & Sonny is good with this.  Will ask scheduler to check on this.

## 2024-03-29 ENCOUNTER — Observation Stay (HOSPITAL_COMMUNITY)
Admission: EM | Admit: 2024-03-29 | Discharge: 2024-03-31 | Disposition: A | Discharge status: Home / Self Care | Attending: Internal Medicine | Admitting: Internal Medicine

## 2024-03-29 ENCOUNTER — Encounter (HOSPITAL_COMMUNITY): Payer: Self-pay | Admitting: Internal Medicine

## 2024-03-29 ENCOUNTER — Other Ambulatory Visit: Payer: Self-pay

## 2024-03-29 ENCOUNTER — Observation Stay (HOSPITAL_BASED_OUTPATIENT_CLINIC_OR_DEPARTMENT_OTHER)

## 2024-03-29 ENCOUNTER — Emergency Department (HOSPITAL_COMMUNITY)

## 2024-03-29 DIAGNOSIS — K862 Cyst of pancreas: Secondary | ICD-10-CM | POA: Diagnosis not present

## 2024-03-29 DIAGNOSIS — Z79899 Other long term (current) drug therapy: Secondary | ICD-10-CM | POA: Insufficient documentation

## 2024-03-29 DIAGNOSIS — R300 Dysuria: Secondary | ICD-10-CM | POA: Insufficient documentation

## 2024-03-29 DIAGNOSIS — F32A Depression, unspecified: Secondary | ICD-10-CM | POA: Insufficient documentation

## 2024-03-29 DIAGNOSIS — D490 Neoplasm of unspecified behavior of digestive system: Secondary | ICD-10-CM

## 2024-03-29 DIAGNOSIS — D72829 Elevated white blood cell count, unspecified: Secondary | ICD-10-CM | POA: Insufficient documentation

## 2024-03-29 DIAGNOSIS — J4540 Moderate persistent asthma, uncomplicated: Secondary | ICD-10-CM | POA: Insufficient documentation

## 2024-03-29 DIAGNOSIS — K8689 Other specified diseases of pancreas: Secondary | ICD-10-CM | POA: Insufficient documentation

## 2024-03-29 DIAGNOSIS — I2693 Single subsegmental pulmonary embolism without acute cor pulmonale: Secondary | ICD-10-CM | POA: Diagnosis not present

## 2024-03-29 DIAGNOSIS — I2694 Multiple subsegmental pulmonary emboli without acute cor pulmonale: Secondary | ICD-10-CM | POA: Diagnosis not present

## 2024-03-29 DIAGNOSIS — Z7982 Long term (current) use of aspirin: Secondary | ICD-10-CM | POA: Insufficient documentation

## 2024-03-29 DIAGNOSIS — Z85118 Personal history of other malignant neoplasm of bronchus and lung: Secondary | ICD-10-CM | POA: Insufficient documentation

## 2024-03-29 DIAGNOSIS — K219 Gastro-esophageal reflux disease without esophagitis: Secondary | ICD-10-CM | POA: Insufficient documentation

## 2024-03-29 DIAGNOSIS — Z7189 Other specified counseling: Secondary | ICD-10-CM | POA: Insufficient documentation

## 2024-03-29 DIAGNOSIS — F1090 Alcohol use, unspecified, uncomplicated: Secondary | ICD-10-CM | POA: Insufficient documentation

## 2024-03-29 DIAGNOSIS — I2699 Other pulmonary embolism without acute cor pulmonale: Principal | ICD-10-CM | POA: Insufficient documentation

## 2024-03-29 DIAGNOSIS — I10 Essential (primary) hypertension: Secondary | ICD-10-CM | POA: Insufficient documentation

## 2024-03-29 DIAGNOSIS — R0902 Hypoxemia: Secondary | ICD-10-CM | POA: Insufficient documentation

## 2024-03-29 LAB — CBC
HCT: 39.9 % (ref 36.0–46.0)
Hemoglobin: 13 g/dL (ref 12.0–15.0)
MCH: 28.9 pg (ref 26.0–34.0)
MCHC: 32.6 g/dL (ref 30.0–36.0)
MCV: 88.7 fL (ref 80.0–100.0)
Platelets: 475 K/uL — ABNORMAL HIGH (ref 150–400)
RBC: 4.5 MIL/uL (ref 3.87–5.11)
RDW: 16.2 % — ABNORMAL HIGH (ref 11.5–15.5)
WBC: 11.5 K/uL — ABNORMAL HIGH (ref 4.0–10.5)
nRBC: 0 % (ref 0.0–0.2)

## 2024-03-29 LAB — COMPREHENSIVE METABOLIC PANEL WITH GFR
ALT: 28 U/L (ref 0–44)
AST: 17 U/L (ref 15–41)
Albumin: 2.8 g/dL — ABNORMAL LOW (ref 3.5–5.0)
Alkaline Phosphatase: 56 U/L (ref 38–126)
Anion gap: 10 (ref 5–15)
BUN: 22 mg/dL (ref 8–23)
CO2: 22 mmol/L (ref 22–32)
Calcium: 8.9 mg/dL (ref 8.9–10.3)
Chloride: 104 mmol/L (ref 98–111)
Creatinine, Ser: 0.78 mg/dL (ref 0.44–1.00)
GFR, Estimated: >60 mL/min (ref >60)
Glucose, Bld: 120 mg/dL — ABNORMAL HIGH (ref 70–99)
Potassium: 4.1 mmol/L (ref 3.5–5.1)
Sodium: 136 mmol/L (ref 135–145)
Total Bilirubin: 0.8 mg/dL (ref 0.0–1.2)
Total Protein: 5.7 g/dL — ABNORMAL LOW (ref 6.5–8.1)

## 2024-03-29 LAB — TROPONIN I (HIGH SENSITIVITY)
Troponin I (High Sensitivity): 13 ng/L (ref <18)
Troponin I (High Sensitivity): 8 ng/L (ref <18)

## 2024-03-29 LAB — ECHOCARDIOGRAM COMPLETE
Area-P 1/2: 2.95 cm2
S' Lateral: 2.5 cm

## 2024-03-29 LAB — LIPASE, BLOOD: Lipase: 52 U/L — ABNORMAL HIGH (ref 11–51)

## 2024-03-29 MED ORDER — AMLODIPINE BESYLATE 5 MG PO TABS
7.5000 mg | ORAL_TABLET | Freq: Every day | ORAL | Status: DC
Start: 2024-03-29 — End: 2024-03-29

## 2024-03-29 MED ORDER — SODIUM CHLORIDE 0.9% FLUSH
3.0000 mL | Freq: Two times a day (BID) | INTRAVENOUS | Status: DC
  Administered 2024-03-29 – 2024-03-31 (×5): 3 mL via INTRAVENOUS

## 2024-03-29 MED ORDER — MORPHINE SULFATE (PF) 2 MG/ML IV SOLN
2.0000 mg | Freq: Once | INTRAVENOUS | Status: AC
  Administered 2024-03-29: 2 mg via INTRAVENOUS
  Filled 2024-03-29: qty 1

## 2024-03-29 MED ORDER — IOHEXOL 350 MG/ML SOLN
75.0000 mL | Freq: Once | INTRAVENOUS | Status: AC | PRN
Start: 2024-03-29 — End: 2024-03-29
  Administered 2024-03-29: 75 mL via INTRAVENOUS

## 2024-03-29 MED ORDER — LOSARTAN POTASSIUM 50 MG PO TABS
50.0000 mg | ORAL_TABLET | Freq: Every day | ORAL | Status: DC
  Administered 2024-03-29 – 2024-03-30 (×2): 50 mg via ORAL
  Filled 2024-03-29 (×2): qty 1

## 2024-03-29 MED ORDER — ONDANSETRON HCL 4 MG PO TABS: 4.0000 mg | ORAL_TABLET | Freq: Four times a day (QID) | ORAL | Status: DC | PRN

## 2024-03-29 MED ORDER — POLYETHYLENE GLYCOL 3350 17 G PO PACK
17.0000 g | PACK | Freq: Every day | ORAL | Status: DC | PRN
  Administered 2024-03-30: 17 g via ORAL
  Filled 2024-03-29: qty 1

## 2024-03-29 MED ORDER — HEPARIN BOLUS VIA INFUSION
3000.0000 [IU] | Freq: Once | INTRAVENOUS | Status: AC
  Administered 2024-03-29: 3000 [IU] via INTRAVENOUS
  Filled 2024-03-29: qty 3000

## 2024-03-29 MED ORDER — MORPHINE SULFATE (PF) 2 MG/ML IV SOLN
2.0000 mg | INTRAVENOUS | Status: DC | PRN
  Administered 2024-03-31: 2 mg via INTRAVENOUS
  Filled 2024-03-29: qty 1

## 2024-03-29 MED ORDER — AMLODIPINE BESYLATE 5 MG PO TABS: 5.0000 mg | ORAL_TABLET | Freq: Every day | ORAL | Status: DC

## 2024-03-29 MED ORDER — PANTOPRAZOLE SODIUM 40 MG IV SOLR
40.0000 mg | Freq: Once | INTRAVENOUS | Status: AC
  Administered 2024-03-29: 40 mg via INTRAVENOUS
  Filled 2024-03-29: qty 10

## 2024-03-29 MED ORDER — ONDANSETRON HCL 4 MG/2ML IJ SOLN: 4.0000 mg | Freq: Four times a day (QID) | INTRAMUSCULAR | Status: DC | PRN

## 2024-03-29 MED ORDER — SIMETHICONE 80 MG PO CHEW
80.0000 mg | CHEWABLE_TABLET | Freq: Four times a day (QID) | ORAL | Status: DC | PRN
  Administered 2024-03-29: 80 mg via ORAL
  Filled 2024-03-29: qty 1

## 2024-03-29 MED ORDER — HEPARIN (PORCINE) 25000 UT/250ML-% IV SOLN
850.0000 [IU]/h | INTRAVENOUS | Status: DC
  Administered 2024-03-29: 850 [IU]/h via INTRAVENOUS
  Filled 2024-03-29: qty 250

## 2024-03-29 MED ORDER — APIXABAN 5 MG PO TABS
10.0000 mg | ORAL_TABLET | Freq: Two times a day (BID) | ORAL | Status: DC
  Administered 2024-03-29 – 2024-03-31 (×4): 10 mg via ORAL
  Filled 2024-03-29 (×2): qty 2
  Filled 2024-03-29: qty 4
  Filled 2024-03-29: qty 2

## 2024-03-29 MED ORDER — MIRTAZAPINE 15 MG PO TABS
15.0000 mg | ORAL_TABLET | Freq: Every day | ORAL | Status: DC
  Administered 2024-03-29 – 2024-03-30 (×2): 15 mg via ORAL
  Filled 2024-03-29 (×2): qty 1

## 2024-03-29 MED ORDER — PANTOPRAZOLE SODIUM 40 MG PO TBEC
40.0000 mg | DELAYED_RELEASE_TABLET | Freq: Every day | ORAL | Status: DC
  Administered 2024-03-30 – 2024-03-31 (×2): 40 mg via ORAL
  Filled 2024-03-29 (×2): qty 1

## 2024-03-29 MED ORDER — HYDROCODONE-ACETAMINOPHEN 5-325 MG PO TABS
1.0000 | ORAL_TABLET | Freq: Four times a day (QID) | ORAL | Status: DC | PRN
  Administered 2024-03-30: 1 via ORAL
  Filled 2024-03-29: qty 1

## 2024-03-29 MED ORDER — ALBUTEROL SULFATE (2.5 MG/3ML) 0.083% IN NEBU: 2.5000 mg | INHALATION_SOLUTION | Freq: Four times a day (QID) | RESPIRATORY_TRACT | Status: DC | PRN

## 2024-03-29 MED ORDER — ONDANSETRON HCL 4 MG/2ML IJ SOLN
4.0000 mg | Freq: Once | INTRAMUSCULAR | Status: AC
  Administered 2024-03-29: 4 mg via INTRAVENOUS
  Filled 2024-03-29: qty 2

## 2024-03-29 MED ORDER — ACETAMINOPHEN 650 MG RE SUPP: 650.0000 mg | Freq: Four times a day (QID) | RECTAL | Status: DC | PRN

## 2024-03-29 MED ORDER — CALCIUM CARBONATE ANTACID 500 MG PO CHEW
1.0000 | CHEWABLE_TABLET | Freq: Two times a day (BID) | ORAL | Status: DC
  Administered 2024-03-30 – 2024-03-31 (×3): 200 mg via ORAL
  Filled 2024-03-29 (×3): qty 1

## 2024-03-29 MED ORDER — ACETAMINOPHEN 325 MG PO TABS: 650.0000 mg | ORAL_TABLET | Freq: Four times a day (QID) | ORAL | Status: DC | PRN

## 2024-03-29 MED ORDER — APIXABAN 5 MG PO TABS: 5.0000 mg | ORAL_TABLET | Freq: Two times a day (BID) | ORAL | Status: DC

## 2024-03-29 NOTE — ED Triage Notes (Signed)
 Patient reports epigastric pain and left back pain onset this morning , denies injury or fall , no emesis or diarrhea .

## 2024-03-29 NOTE — Assessment & Plan Note (Addendum)
 On trelegy regularly Reports sub-optimal response Will increase strength to 200/62.5/25  daily

## 2024-03-29 NOTE — H&P (Addendum)
 History and Physical    Patient: Sandra Cooper DOB: 18-Jul-1929 DOA: 03/29/2024 DOS: the patient was seen and examined on 03/29/2024 PCP: Beam, Lamar POUR, MD  Patient coming from: Home  Chief Complaint:  Chief Complaint  Patient presents with   Abdominal Pain   Back Pain   HPI: Sandra Cooper is a 89 y.o. female with medical history significant of asthma, hypertension, and history of lung cancer(adenocarcinoma) s/p radiation therapy  severe abdominal and back pain. She is accompanied by her daughter.  She was hospitalized 10/17-10/23 for pneumonia and completed 2 more days of Augmentin at home.  Initially, she felt better with improved strength and breathing. However, two days ago, she began experiencing a burning, tender pain in the epigastric abdominal area.  This morning, after a bout of coughing, she awoke with severe pain rated at a level ten, located left side of her back and wrapping around to the front. The pain was intense and located in the same area where she previously had lesions from treated lung cancer three years ago with radiation treatment.  No calf pain, although she noted slight swelling in her left leg when wearing socks, which was more noticeable than the right leg. She also mentioned experiencing numbness in her legs that is chronic.  She is currently taking simethicone  with every meal. She is experiencing significant thirst and requested water during the conversation.  In the emergency department patient was noted to be afebrile with O2 saturations currently maintained on 3 L of nasal cannula.  Labs significant for WBC 11.5, platelets 475, lipase 52, and high-sensitivity troponins negative x 2.  CT angiogram of the chest noted segmental and subsegmental pulmonary emboli with questionable additional subsegmental embolus in the left lower lobe without evidence of right heart strain, and a nodular consolidation in the left lung base measuring 2.7 x 2.1 cm.  CT scan of  the abdomen pelvis revealed a cluster of cysts within the proximal pancreatic body measuring 17 x 10 x 20 mm concerning for sidebranch intraductal papillary mucinous neoplasm.  Patient had been given 2 g of morphine IV, Zofran , and Protonix 40 mg IV.  [facility] have been consulted to evaluate.  Review of Systems: As mentioned in the history of present illness. All other systems reviewed and are negative. Past Medical History:  Diagnosis Date   Arthritis    Asthma    Hypertension    Lung cancer (HCC)    adenocarcinoma LLL   Past Surgical History:  Procedure Laterality Date   APPENDECTOMY     CT guided lung biopsy     HERNIA REPAIR     I & D EXTREMITY Left 08/26/2020   Procedure: IRRIGATION AND DEBRIDEMENT EXTREMITY;  Surgeon: Lorretta Dess, MD;  Location: [facility];  Service: Plastics;  Laterality: Left;   ORIF WRIST FRACTURE Left 08/26/2020   Procedure: OPEN REDUCTION INTERNAL FIXATION (ORIF) WRIST FRACTURE;  Surgeon: Lorretta Dess, MD;  Location: [facility];  Service: Plastics;  Laterality: Left;   Social History:  reports that she has never smoked. She has never used smokeless tobacco. She reports current alcohol use. She reports that she does not use drugs.  Allergies  Allergen Reactions   Fluoxetine Swelling and Other (See Comments)    Facial swelling and shakiness    Family History  Problem Relation Age of Onset   Cancer Neg Hx     Prior to Admission medications   Medication Sig Start Date End Date Taking? Authorizing Provider  acetaminophen  (  TYLENOL ) 500 MG tablet Take 500 mg by mouth every 6 (six) hours as needed for moderate pain (pain score 4-6) or mild pain (pain score 1-3).    [provider]  albuterol  (VENTOLIN  HFA) 108 (90 Base) MCG/ACT inhaler Inhale 2 puffs into the lungs every 6 (six) hours as needed for wheezing or shortness of breath. 03/15/19   Cheryl Algis SAILOR, MD  amLODipine  (NORVASC ) 2.5 MG tablet Take 2.5 mg by mouth in the morning. Take  1 tablet by mouth every morning with 1 of the 5mg  tablets of Amlodipine  for a combined dose of 7.5mg  Amlodipine .    [provider]  ASPIRIN PO Take 81 mg by mouth daily.    [provider]  Calcium Carb-Cholecalciferol (CALCIUM-VITAMIN D) 600-400 MG-UNIT TABS Take 1 tablet by mouth daily.    [provider]  Cholecalciferol (VITAMIN D) 50 MCG (2000 UT) CAPS Take 2,000 Units by mouth daily.    [provider]  DHA-Vitamin C-Lutein (EYE HEALTH FORMULA PO) Take 1 capsule by mouth daily.    [provider]  losartan  (COZAAR ) 50 MG tablet Take 1 tablet (50 mg total) by mouth every evening. 03/23/24 04/22/24  Leotis Bogus, MD  magnesium  gluconate (MAGONATE) 500 MG tablet Take 500 mg by mouth daily.    [provider]  meloxicam  (MOBIC ) 15 MG tablet Take 15 mg by mouth at bedtime. Patient not taking: Reported on 03/27/2024 02/16/24   [provider]  mirtazapine  (REMERON ) 15 MG tablet Take 15 mg by mouth at bedtime. 11/29/18   [provider]  Multiple Vitamin (MULTIVITAMIN) tablet Take 1 tablet by mouth daily.    [provider]  Omega-3 Fatty Acids (OMEGA 3 PO) Take 1 tablet by mouth daily. XL    [provider]  OVER THE COUNTER MEDICATION Take 1 tablet by mouth See admin instructions. Life Extension, Brain Support Supplement tablets- Take 1 tablet by mouth once a day    [provider]  polyethylene glycol (MIRALAX / GLYCOLAX) 17 g packet Take 17 g by mouth daily as needed for mild constipation.    [provider]  Potassium 99 MG TABS Take 99 mg by mouth daily.    [provider]  SIMETHICONE  PO Take 1 tablet by mouth with breakfast, with lunch, and with evening meal.    [provider]  Spacer/Aero-Holding Chambers (AEROCHAMBER MV) inhaler Use as instructed Patient not taking: Reported on 03/27/2024 04/05/19   Shellia Oh, MD  TRELEGY ELLIPTA 200-62.5-25 MCG/ACT AEPB Inhale  1 puff into the lungs daily. 03/27/24   Baral, Dipti, MD  vitamin B-12 (CYANOCOBALAMIN ) 500 MCG tablet Take 500 mcg by mouth daily. Patient not taking: Reported on 03/27/2024    [provider]  zoledronic acid (RECLAST) 5 MG/100ML SOLN injection Inject 5 mg into the vein once. Once a year    [provider]    Physical Exam: Vitals:   03/29/24 0526 03/29/24 0800 03/29/24 0815  BP: 118/78 107/62 108/67  Pulse: 81 66 66  Resp: 16 14 12   Temp: 97.8 F (36.6 C)    TempSrc: Oral    SpO2: 93% 94% 94%   Constitutional: Elderly female currently in no acute distress Eyes: PERRL, lids and conjunctivae normal ENMT: Mucous membranes are moist.  Normal dentition.  Neck: normal, supple  Respiratory: Respiratory effort with O2 saturation currently maintained on 3 L of nasal cannula oxygen. Cardiovascular: Regular rate and rhythm, no murmurs / rubs / gallops. No extremity edema.  2+ pedal pulses.   Abdomen: Mild tenderness to palpation epigastrically.   Bowel sounds positive.  Musculoskeletal: no clubbing / cyanosis. No joint deformity upper and lower extremities. Good ROM, no contractures. Normal muscle tone.  Skin: no rashes, lesions, ulcers. N  Neurologic: CN 2-12 grossly intact.  Able to move all extremities Psychiatric: Normal judgment and insight. Alert and oriented x 3. Normal mood.   Data Reviewed:  EKG reveals sinus rhythm at 75 bpm reviewed labs, imaging, and pertinent records as documented.  Assessment and Plan:  Pulmonary embolism Acute.  Patient presented with acute onset of left-sided back pain radiating.  Found to have concern segmental and subsegmental pulmonary emboli with questionable additional subsegmental embolus in the left lower lobe without evidence of right heart strain on CTA of the chest.  O2 saturations currently maintained on 2 L of nasal cannula oxygen.  Patient was ordered to be started on a heparin drip.  Risk factors include concern for  malignancy. - Admit to a telemetry bed - Continue nasal cannula oxygen maintain O2 saturation greater than 92% - Incentive spirometry and flutter valve - Strict bedrest until able to confirm no signs of a DVT - Continue heparin drip per pharmacy.  Plan to transition to oral anticoagulation likely Eliquis if no further workup and patient recommended - Check echocardiogram and Doppler ultrasound of lower extremities - Hydrocodone  as needed for pain  Leukocytosis Present prior to arrival.  WBC 11.5.  Trending down from when last noted to be 16.9 when checked on 10/22.  Possibly related with above. - Recheck CBC tomorrow morning  Pancreatic cyst Patient reported having epigastric abdominal pain.  Lipase elevated at 52.  CT imaging of the abdomen and pelvis revealed a cluster of cysts within the proximal pancreatic body measuring 17 x 10 x 20 mm concerning for sidebranch intraductal papillary mucinous neoplasm.  [facility] had been consulted.   - Follow-up with [facility] for further recommendations  Moderate persistent asthma Last pulmonary function testing done back in 2020.  Patient followed up with pulmonology on the 27th. - Albuterol  nebs as needed  Hypertension Blood pressures currently maintained, but noted to be somewhat 92/64. - Continue losartan .  Held amlodipine .  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 - Continue outpatient follow-up with oncology  Depression - Continue mirtazapine   GERD - recommend Protonix daily while on anticoagulation  DVT prophylaxis: Heparin Advance Care Planning:   Code Status: Limited: Do not attempt resuscitation (DNR) -DNR-LIMITED -Do Not Intubate/DNI     Consults: [facility]  Family Communication: Daughter updated at bedside  Severity of Illness: The appropriate patient status for this patient is OBSERVATION. Observation status  is judged to be reasonable and necessary in order to provide the required intensity of service to ensure the patient's safety. The patient's presenting symptoms, physical exam findings, and initial radiographic and laboratory data in the context of their medical condition is felt to place them at decreased risk for further clinical deterioration. Furthermore, it is anticipated that the patient will be medically stable for discharge from the hospital within 2 midnights of admission.   Author: Maximino DELENA Sharps, MD 03/29/2024 8:51 AM  For on call review [email or web address].

## 2024-03-29 NOTE — Plan of Care (Signed)

## 2024-03-29 NOTE — Progress Notes (Signed)
 PHARMACY - ANTICOAGULATION CONSULT NOTE  Pharmacy Consult for heparin Indication: pulmonary embolus  Allergies  Allergen Reactions   Fluoxetine Swelling and Other (See Comments)    Facial swelling and shakiness    Patient Measurements:    Vital Signs: Temp: 97.8 F (36.6 C) (10/29 0526) Temp Source: Oral (10/29 0526) BP: 108/67 (10/29 0815) Pulse Rate: 66 (10/29 0815)  Labs: Recent Labs    03/29/24 0532 03/29/24 0747  HGB 13.0  --   HCT 39.9  --   PLT 475*  --   CREATININE 0.78  --   TROPONINIHS 13 8    Estimated Creatinine Clearance: 32.2 mL/min (by C-G formula based on SCr of 0.78 mg/dL).   Medical History: Past Medical History:  Diagnosis Date   Arthritis    Asthma    Hypertension    Lung cancer (HCC)    adenocarcinoma LLL    Assessment: Sandra Cooper presenting with CP, CT angio chest shows PE, she is not on anticoagulation PTA, H/H wnl  Goal of Therapy:  Heparin level 0.3-0.7 units/ml Monitor platelets by anticoagulation protocol: Yes   Plan:  Heparin 3000 units IV x 1, and gtt at 850 units/hr F/u 8 hour heparin level F/u long term [facility] plan  Dorn Poot, PharmD, [facility] Clinical Pharmacist ED Pharmacist Phone # [phone] 03/29/2024 9:05 AM

## 2024-03-29 NOTE — ED Notes (Signed)
 Patient transported to CT

## 2024-03-29 NOTE — Care Management Obs Status (Addendum)
 MEDICARE OBSERVATION STATUS NOTIFICATION   Patient Details  Name: Sandra Cooper MRN: [id number] Date of Birth: [birth year]   Medicare Observation Status Notification Given:    Verbally reviewed observation notice with [name] telephonically a [phone]  Patients son request a copy of this notice be sent to his email address [email or web address]    [name] 03/29/2024, 4:27 PM

## 2024-03-29 NOTE — Consult Note (Addendum)
 Consultation Note   Referring Provider:   [facility] PCP: Beam, Lamar POUR, MD Primary Gastroenterologist:  [facility] once in 2023 , Saw GI once in [place].       Reason for Consultation:  Pancreatic cysts.  DOA: 03/29/2024         Hospital Day: 1   ASSESSMENT    89 yo female admitted with acute PE with right heart strain.  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    Asthma COPD Recent admission for acute respiratory failure 2/2 to COPD exacerbation and RUL pneumonia  History of lung adenocarcinoma s/p radiation  Chronic bloating / gas Takes Simethicone  with meals  See PMH for any additional medical history  / medical problems  Principal Problem:   Pulmonary embolus (HCC) Active Problems:   Hypertension, essential   Depression   Pancreatic mass   Moderate persistent asthma   History of lung cancer   GERD (gastroesophageal reflux disease)   Leukocytosis    PLAN:   --No need for any immediate evaluation of pancreatic cysts, especially in setting of acute PE. Discussed with daughter who agrees. Our office will arrange for MRI / MRCP in 6 months   HPI   Brief GI History:  Patient is a 89 yo female from [place] who lives in [place] most of the year but in [place] a few months out of the year. In may 2023 she was seen once by GI ( [facility]) for rectal bleeding, melena, epigastric pain and altered bowel habits  Started on PPI. Appears esophagram and labs ordered but result not found ( tests done? ) Found to have prolapsed hemorrhoid on exam. At some point patient went back to [place]. Was seen in ED at some point for weakness and cough. Workup showed incidental finding of a pancreatic cystic.  Subsequently saw GI ( Interventional Gastroenterologist) on 05/07/23 and  below is excerpt from that note in Care Everywhere:    a pancreatic cyst was seen on CT thorax for which MRCP was ordered.  MRCP was significant for several foci of T2 hyperintensity in both lobes of the liver as well as  small cystic foci in the pancreatic tail, one measuring 4.4 x 5.9 mm and a second measuring 3.5 x 4.2 mm suggesting side branch intraductal papillary mucinous tumors.  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 Due to patient' sage, surveillance might now be reasonable since not a candidate for major pancreatic surgery .   Interval history:  Patient was discharged from hospital last week following admission for pneumonia ( ? Aspiration)  / sepsis. Came back to ED yesterday with back pain / abdominal pain. CTA chest showed acute PE with right heart strain. Started on heparin. CT abd / pelvis showed  pancreatic cysts concerning for IPMN for which we were asked to evaluate                                                 Pertinent Studies:   Lipase 52, albumin 28 remainder of LFTs normal. WBC 11.5, Hbg 13, platelets 475  CT AP with contrast Cluster of cysts within the proximal pancreatic body measuring approximately 17 x 10 x 20 mm, concerning for side-branch IPMN, with concomitant main pancreatic duct dilation to 7 mm. Recommend gastroenterology/surgical consultation and pancreas-protocol MRI/MRCP or EUS for further risk stratification. 2. Numerous sigmoid diverticula without evidence of diverticulitis. 3. Extensive calcific atherosclerosis of the abdominal aorta and iliac arteries.  Daughter in room at time of visit. Patient gives a history of chronic abdominal bloating /  gas. Simethicone  with meals helps. No rectal bleeding since she was evaluated in 2023. No unintentional weight loss - has gained.    Pertinent GI Studies   Last colonoscopy  many years ago in [place])   Labs and Imaging:  Recent Labs    03/29/24 0532  PROT 5.7*  ALBUMIN 2.8*  AST 17  ALT 28  ALKPHOS 56  BILITOT 0.8   Recent Labs    03/29/24 0532  WBC 11.5*  HGB 13.0  HCT 39.9  MCV 88.7  PLT 475*   Recent Labs    03/29/24 0532  NA 136  K 4.1  CL 104  CO2 22  GLUCOSE 120*  BUN 22  CREATININE 0.78  CALCIUM 8.9     CT ABDOMEN PELVIS W CONTRAST EXAM: CT ABDOMEN AND PELVIS WITH CONTRAST 03/29/2024 07:19:31 AM  TECHNIQUE: CT of the abdomen and pelvis was performed with the administration of 75 mL of iohexol  (OMNIPAQUE ) 350 MG/ML injection. Multiplanar reformatted images are provided for review. Automated exposure control, iterative reconstruction, and/or weight-based adjustment of the mA/kV was utilized to reduce the radiation dose to as low as reasonably achievable.  COMPARISON: None available.  CLINICAL HISTORY: Abdominal pain, acute, nonlocalized. Patient reports epigastric pain and left back pain onset this morning, denies injury or fall, no emesis or diarrhea.  FINDINGS:  LOWER CHEST: No acute abnormality.  LIVER: There are several small circumscribed cysts within the liver.  GALLBLADDER AND BILE DUCTS: Gallbladder is unremarkable. No biliary ductal dilatation.  SPLEEN: No acute abnormality.  PANCREAS: There is a cluster of cysts present within the proximal body of the pancreas, measuring approximately 17 x 10 x 20 mm, concerning for side branch intraductal papillary mucinous neoplasm. The main pancreatic duct is also dilated, measuring 7 mm in diameter.  ADRENAL GLANDS: No acute abnormality.  KIDNEYS, URETERS AND BLADDER: No stones in the kidneys or ureters. No hydronephrosis. No perinephric or periureteral stranding. Urinary  bladder is unremarkable.  GI AND BOWEL: There are numerous sigmoid diverticula present. Stomach demonstrates no acute abnormality. There is no bowel obstruction.  PERITONEUM AND RETROPERITONEUM: No ascites. No free air.  VASCULATURE: There is extensive calcific atheromatous disease within the abdominal aorta and iliac arteries.  LYMPH NODES: No lymphadenopathy.  REPRODUCTIVE ORGANS: No acute abnormality.  BONES AND SOFT TISSUES: There is degenerative anterolisthesis at L4-L5 and multilevel degenerative disc disease within the lumbar spine. No acute osseous abnormality. No focal soft tissue abnormality.  IMPRESSION: 1. Cluster of cysts within the proximal pancreatic body measuring approximately 17 x 10 x 20  mm, concerning for side-branch IPMN, with concomitant main pancreatic duct dilation to 7 mm. Recommend gastroenterology/surgical consultation and pancreas-protocol MRI/MRCP or EUS for further risk stratification. 2. Numerous sigmoid diverticula without evidence of diverticulitis. 3. Extensive calcific atherosclerosis of the abdominal aorta and iliac arteries.  Electronically signed by: Evalene Coho MD 03/29/2024 07:59 AM EDT RP Workstation: [id number] CT Angio Chest PE W/Cm &/Or Wo Cm EXAM: CTA of the Chest with contrast for PE 03/29/2024 07:19:31 AM  TECHNIQUE: CTA of the chest was performed without and with the administration of 75 mL of intravenous iohexol  (OMNIPAQUE ) 350 MG/ML injection. Multiplanar reformatted images are provided for review. MIP images are provided for review. Automated exposure control, iterative reconstruction, and/or weight based adjustment of the mA/kV was utilized to reduce the radiation dose to as low as reasonably achievable.  COMPARISON: CT angiogram of the chest dated 03/17/2024.  CLINICAL HISTORY:  FINDINGS:  PULMONARY ARTERIES: Pulmonary arteries are adequately opacified for evaluation. There are intraluminal filling  defects present within segmental and subsegmental branches of the pulmonary arterial trees. The filling defects can be seen on images 158, 16, and 163 of series 16. There is also a questionable filling defect within the subsegmental artery of the left lower lobe on image 172. Main pulmonary artery is normal in caliber.  MEDIASTINUM: The heart and pericardium demonstrate no acute abnormality. There is no evidence of right ventricular strain. There is mild calcific coronary artery disease. There is moderate calcific atheromatous disease within the thoracic aorta.  LYMPH NODES: No mediastinal, hilar or axillary lymphadenopathy.  LUNGS AND PLEURA: There is dependent atelectasis within the right upper lobe. There are streaky peribronchovascular opacities also present within the lower lobes bilaterally. There is a nodular area of opacification/consolidation present in the left lung base measuring approximately 2.7 x 2.1 cm in cross-sectional diameter which merits follow up to ensure resolution. The nondependent lungs are clear. No pleural effusion or pneumothorax.  UPPER ABDOMEN: Limited images of the upper abdomen are unremarkable.  SOFT TISSUES AND BONES: There are healing fractures of the left lateral seventh, eighth and ninth ribs.  IMPRESSION: 1. Segmental and subsegmental pulmonary emboli with a questionable additional subsegmental embolus in the left lower lobe. No evidence of right ventricular strain. 2. Nodular consolidation in the left lung base measuring approximately 2.7 x 2.1 cm; recommend short-interval non-contrast chest CT in 3 months to ensure resolution. If persistent or with suspicious features, consider further evaluation (e.g., PET/CT or tissue sampling) given size >2 cm and non-incidental consolidative appearance. 3. Healing fractures of the left lateral seventh, eighth, and ninth ribs.  Electronically signed by: Evalene Coho MD 03/29/2024 07:53 AM EDT  RP Workstation: [id number] DG Chest 2 View EXAM: 2 VIEW(S) XRAY OF THE CHEST 03/29/2024 05:58:00 AM  COMPARISON: 03/17/2024  CLINICAL HISTORY: Epigastric pain. Encounter for epigastric pain.  FINDINGS:  LUNGS AND PLEURA: Low lung volumes. Left base scarring. No focal pulmonary opacity. No pulmonary edema. No pleural effusion. No pneumothorax.  HEART AND MEDIASTINUM: Mild cardiomegaly. Atherosclerosis in transverse aorta.  BONES AND SOFT TISSUES: Remote left rib trauma. No acute osseous abnormality.  IMPRESSION: 1. No acute findings. 2. Mild cardiomegaly and atherosclerosis in transverse aorta. 3. Low lung volumes and left base scarring.  Electronically signed by: Evalene Coho MD 03/29/2024 06:19 AM EDT RP Workstation: [id number]   Past Medical History:  Diagnosis Date   Arthritis    Asthma    Hypertension    Lung cancer (HCC)    adenocarcinoma LLL  Past Surgical History:  Procedure Laterality Date   APPENDECTOMY     CT guided lung biopsy     HERNIA REPAIR     I & D EXTREMITY Left 08/26/2020   Procedure: IRRIGATION AND DEBRIDEMENT EXTREMITY;  Surgeon: Lorretta Dess, MD;  Location: [facility];  Service: Plastics;  Laterality: Left;   ORIF WRIST FRACTURE Left 08/26/2020   Procedure: OPEN REDUCTION INTERNAL FIXATION (ORIF) WRIST FRACTURE;  Surgeon: Lorretta Dess, MD;  Location: [facility];  Service: Plastics;  Laterality: Left;    Family History  Problem Relation Age of Onset   Cancer Neg Hx     Prior to Admission medications   Medication Sig Start Date End Date Taking? Authorizing Provider  albuterol  (VENTOLIN  HFA) 108 (90 Base) MCG/ACT inhaler Inhale 2 puffs into the lungs every 6 (six) hours as needed for wheezing or shortness of breath. 03/15/19  Yes Buriev, Algis SAILOR, MD  amLODipine  (NORVASC ) 2.5 MG tablet Take 2.5 mg by mouth daily.   Yes [provider]  amLODipine  (NORVASC ) 5 MG tablet Take 5 mg by mouth daily.   Yes [provider]   aspirin EC 81 MG tablet Take 81 mg by mouth daily. Swallow whole.   Yes [provider]  Calcium Carb-Cholecalciferol (CALCIUM-VITAMIN D) 600-400 MG-UNIT TABS Take 1 tablet by mouth daily.   Yes [provider]  Cholecalciferol (VITAMIN D) 50 MCG (2000 UT) CAPS Take 2,000 Units by mouth daily.   Yes [provider]  ibuprofen  (ADVIL ) 200 MG tablet Take 800 mg by mouth daily as needed for mild pain (pain score 1-3) or moderate pain (pain score 4-6).   Yes [provider]  losartan  (COZAAR ) 50 MG tablet Take 1 tablet (50 mg total) by mouth every evening. Patient taking differently: Take 50 mg by mouth at bedtime. 03/23/24 04/22/24 Yes Leotis Bogus, MD  magnesium  gluconate (MAGONATE) 500 MG tablet Take 500 mg by mouth daily.   Yes [provider]  mirtazapine  (REMERON ) 15 MG tablet Take 15 mg by mouth at bedtime. 11/29/18  Yes [provider]  Multiple Vitamin (MULTIVITAMIN) tablet Take 1 tablet by mouth daily.   Yes [provider]  Omega-3 Fatty Acids (OMEGA 3 PO) Take 1 tablet by mouth daily.   Yes [provider]  OVER THE COUNTER MEDICATION Take 1 tablet by mouth daily. Life Extension, Brain Support Supplement   Yes [provider]  polyethylene glycol (MIRALAX / GLYCOLAX) 17 g packet Take 17 g by mouth daily as needed for mild constipation.   Yes [provider]  Potassium 99 MG TABS Take 99 mg by mouth daily.   Yes [provider]  SIMETHICONE  PO Take 1 tablet by mouth with breakfast, with lunch, and with evening meal.   Yes [provider]  TRELEGY ELLIPTA 200-62.5-25 MCG/ACT AEPB Inhale 1 puff into the lungs daily. 03/27/24  Yes Baral, Dipti, MD  vitamin B-12 (CYANOCOBALAMIN ) 500 MCG tablet Take 500 mcg by mouth daily.   Yes [provider]  zoledronic acid (RECLAST) 5 MG/100ML SOLN injection Inject 5 mg into the vein once. Once a year   Yes [provider]   meloxicam  (MOBIC ) 15 MG tablet Take 15 mg by mouth at bedtime. Patient not taking: No sig reported 02/16/24   [provider]  pantoprazole (PROTONIX) 40 MG tablet Take 40 mg by mouth daily. Patient not taking: Reported on 03/29/2024    [provider]    Current Facility-Administered Medications  Medication  Dose Route Frequency Provider Last Rate Last Admin   acetaminophen  (TYLENOL ) tablet 650 mg  650 mg Oral Q6H PRN Claudene Maximino LABOR, MD       Or   acetaminophen  (TYLENOL ) suppository 650 mg  650 mg Rectal Q6H PRN Smith, Rondell A, MD       albuterol  (PROVENTIL ) (2.5 MG/3ML) 0.083% nebulizer solution 2.5 mg  2.5 mg Nebulization Q6H PRN Claudene, Rondell A, MD       heparin ADULT infusion 100 units/mL (25000 units/250mL)  850 Units/hr Intravenous Continuous Gaines Carrier, RPH 8.5 mL/hr at 03/29/24 0957 850 Units/hr at 03/29/24 0957   HYDROcodone -acetaminophen  (NORCO/VICODIN) 5-325 MG per tablet 1 tablet  1 tablet Oral Q6H PRN Claudene Maximino LABOR, MD       losartan  (COZAAR ) tablet 50 mg  50 mg Oral QHS Smith, Rondell A, MD       mirtazapine  (REMERON ) tablet 15 mg  15 mg Oral QHS Smith, Rondell A, MD       morphine (PF) 2 MG/ML injection 2 mg  2 mg Intravenous Q4H PRN Smith, Rondell A, MD       ondansetron  (ZOFRAN ) tablet 4 mg  4 mg Oral Q6H PRN Claudene Maximino A, MD       Or   ondansetron  (ZOFRAN ) injection 4 mg  4 mg Intravenous Q6H PRN Smith, Rondell A, MD       pantoprazole (PROTONIX) EC tablet 40 mg  40 mg Oral Daily Smith, Rondell A, MD       polyethylene glycol (MIRALAX / GLYCOLAX) packet 17 g  17 g Oral Daily PRN Smith, Rondell A, MD       simethicone  (MYLICON) chewable tablet 80 mg  80 mg Oral QID PRN Smith, Rondell A, MD       sodium chloride  flush (NS) 0.9 % injection 3 mL  3 mL Intravenous Q12H Smith, Rondell A, MD   3 mL at 03/29/24 0959    Allergies as of 03/29/2024 - Review Complete 03/29/2024  Allergen Reaction Noted   Fluoxetine Swelling and Other (See  Comments) 12/16/2017    Social History   Socioeconomic History   Marital status: Widowed    Spouse name: Not on file   Number of children: Not on file   Years of education: Not on file   Highest education level: Not on file  Occupational History    Comment: retired  Tobacco Use   Smoking status: Never   Smokeless tobacco: Never   Tobacco comments:    Exposed to second hand smoke  Vaping Use   Vaping status: Never Used  Substance and Sexual Activity   Alcohol use: Yes   Drug use: Never   Sexual activity: Not Currently  Other Topics Concern   Not on file  Social History Narrative   Not on file   Social Drivers of Health   Financial Resource Strain: Low Risk  (02/29/2024)   Received from [facility]   Overall Financial Resource Strain (CARDIA)    How hard is it for you to pay for the very basics like food, housing, medical care, and heating?: Not hard at all  Food Insecurity: No Food Insecurity (03/29/2024)   Hunger Vital Sign    Worried About Running Out of Food in the Last Year: Never true    Ran Out of Food in the Last Year: Never true  Transportation Needs: No Transportation Needs (03/29/2024)   PRAPARE - Administrator, Civil Service (Medical): No  Lack of Transportation (Non-Medical): No  Physical Activity: Inactive (02/29/2024)   Received from [facility]   Exercise Vital Sign    On average, how many days per week do you engage in moderate to strenuous exercise (like a brisk walk)?: 0 days    Minutes of Exercise per Session: Not on file  Stress: No Stress Concern Present (02/29/2024)   Received from [facility] - Occupational Stress Questionnaire    Do you feel stress - tense, restless, nervous, or anxious, or unable to sleep at night because your mind is troubled all the time - these days?: Not at all  Social Connections: Moderately Isolated (03/29/2024)   Social Connection and Isolation Panel     Frequency of Communication with Friends and Family: More than three times a week    Frequency of Social Gatherings with Friends and Family: More than three times a week    Attends Religious Services: More than 4 times per year    Active Member of [employer] or Organizations: No    Attends Banker Meetings: Never    Marital Status: Widowed  Intimate Partner Violence: Not At Risk (03/29/2024)   Humiliation, Afraid, Rape, and Kick questionnaire    Fear of Current or Ex-Partner: No    Emotionally Abused: No    Physically Abused: No    Sexually Abused: No     Code Status   Code Status: Limited: Do not attempt resuscitation (DNR) -DNR-LIMITED -Do Not Intubate/DNI   Review of Systems: All systems reviewed and negative except where noted in HPI.  Physical Exam: Vital signs in last 24 hours: Temp:  [97.7 F (36.5 C)-97.9 F (36.6 C)] 97.7 F (36.5 C) (10/29 1343) Pulse Rate:  [63-81] 65 (10/29 1343) Resp:  [12-18] 18 (10/29 1343) BP: (92-124)/(59-93) 119/65 (10/29 1343) SpO2:  [93 %-98 %] 96 % (10/29 1300)    General:  Pleasant female in NAD Psych:  Cooperative. Normal mood and affect Eyes: Pupils equal Ears:  Normal auditory acuity Nose: No deformity, discharge or lesions Neck:  Supple, no masses felt Lungs:  Clear to auscultation.  Heart:  Regular rate, regular rhythm.  Abdomen:  Soft, nondistended, nontender, active bowel sounds, no masses felt Rectal :  Deferred Msk: Symmetrical without gross deformities.  Neurologic:  Alert, oriented, grossly normal neurologically Extremities : No edema Skin:  Intact without significant lesions.    Intake/Output from previous day: No intake/output data recorded. Intake/Output this shift:  No intake/output data recorded.   Vina Dasen, NP-C   03/29/2024, 2:43 PM  ----------------------------------------------------------------------   I have taken a history, reviewed the chart and examined the patient. I performed a  substantive portion of this encounter, including complete performance of at least one of the key components, in conjunction with the APP. I agree with the APP's note, impression and recommendations   89 year old female admitted with pulmonary embolism, incidentally noted to have cystic lesion in the pancreas on CT.  This lesion was first noted a year ago, but the cyst has increased in size by almost a centimeter and has some mild main duct dilation.  Based on the previous MR findings this is most likely an IPMN.  The increase in size certainly warrants consideration for further evaluation to include endoscopic ultrasound. We did discuss the utility of further evaluation of this.  We discussed how if there is concern for cancer within this cystic neoplasm, then the treatment would be surgery.  We discussed how  pancreatic surgery is very invasive, high risk and it is likely that she would not be considered a surgical candidate. In addition, the patient was not sure that she would want to pursue such a treatment even if it were offered.  There is nothing urgent that needs to be done about this lesion during this admission.  I recommend she follow-up with me in the office at which point we can further discuss whether or not she would want to pursue further evaluation with either an MRI or an endoscopic ultrasound.   Aviyana Sonntag E. Stacia, MD [facility]

## 2024-03-29 NOTE — ED Provider Notes (Signed)
 [place] EMERGENCY DEPARTMENT AT [facility] Provider Note   CSN: [id number] Arrival date & time: 03/29/24  9483     Patient presents with: Abdominal Pain and Back Pain   Sandra Cooper is a 89 y.o. female.    Abdominal Pain Back Pain Associated symptoms: abdominal pain   Sandra Cooper is a 89 y.o. female who presents to the Emergency Department complaining of chest pain.  She presents to the emergency department accompanied by her daughter and son for evaluation of acute left sided chest pain that radiates to her back.  Pain woke her at 4 AM.  She has associated diaphoresis.  Pain is described as 10 out of 10.  She did have some coughing earlier in the day.  2 days ago she did have some central chest and epigastric burning that was significant but this resolved.  She was recently discharged from the hospital 1 week ago following admission for pneumonia.  She had a fall 2 months ago that resulted in some nondisplaced rib fractures.  She has mild shortness of breath, epigastric pain.  No fever.  She took 4 ibuprofen  prior to ED arrival, which seemed to help her symptoms.  No vomiting.  She does report some mild left lower extremity swelling.  She does have a history of lung cancer with scheduled PET scan on December 1.      Prior to Admission medications   Medication Sig Start Date End Date Taking? Authorizing Provider  acetaminophen  (TYLENOL ) 500 MG tablet Take 500 mg by mouth every 6 (six) hours as needed for moderate pain (pain score 4-6) or mild pain (pain score 1-3).    [provider]  albuterol  (VENTOLIN  HFA) 108 (90 Base) MCG/ACT inhaler Inhale 2 puffs into the lungs every 6 (six) hours as needed for wheezing or shortness of breath. 03/15/19   Cheryl Algis SAILOR, MD  amLODipine  (NORVASC ) 2.5 MG tablet Take 2.5 mg by mouth in the morning. Take 1 tablet by mouth every morning with 1 of the 5mg  tablets of Amlodipine  for a combined dose of 7.5mg  Amlodipine .    [provider]  ASPIRIN PO Take 81 mg by mouth daily.    [provider]  Calcium Carb-Cholecalciferol (CALCIUM-VITAMIN D) 600-400 MG-UNIT TABS Take 1 tablet by mouth daily.    [provider]  Cholecalciferol (VITAMIN D) 50 MCG (2000 UT) CAPS Take 2,000 Units by mouth daily.    [provider]  DHA-Vitamin C-Lutein (EYE HEALTH FORMULA PO) Take 1 capsule by mouth daily.    [provider]  losartan  (COZAAR ) 50 MG tablet Take 1 tablet (50 mg total) by mouth every evening. 03/23/24 04/22/24  Leotis Bogus, MD  magnesium  gluconate (MAGONATE) 500 MG tablet Take 500 mg by mouth daily.    [provider]  meloxicam  (MOBIC ) 15 MG tablet Take 15 mg by mouth at bedtime. Patient not taking: Reported on 03/27/2024 02/16/24   [provider]  mirtazapine  (REMERON ) 15 MG tablet Take 15 mg by mouth at bedtime. 11/29/18   [provider]  Multiple Vitamin (MULTIVITAMIN) tablet Take 1 tablet by mouth daily.    [provider]  Omega-3 Fatty Acids (OMEGA 3 PO) Take 1 tablet by mouth daily. XL    [provider]  OVER THE COUNTER MEDICATION Take 1 tablet by mouth See admin instructions. Life Extension, Brain Support Supplement tablets- Take 1 tablet by mouth once a day    [provider]  polyethylene glycol (MIRALAX /  GLYCOLAX) 17 g packet Take 17 g by mouth daily as needed for mild constipation.    [provider]  Potassium 99 MG TABS Take 99 mg by mouth daily.    [provider]  SIMETHICONE  PO Take 1 tablet by mouth with breakfast, with lunch, and with evening meal.    [provider]  Spacer/Aero-Holding Chambers (AEROCHAMBER MV) inhaler Use as instructed Patient not taking: Reported on 03/27/2024 04/05/19   Shellia Oh, MD  TRELEGY ELLIPTA 200-62.5-25 MCG/ACT AEPB Inhale 1 puff into the lungs daily. 03/27/24   Baral, Dipti, MD  vitamin B-12 (CYANOCOBALAMIN ) 500 MCG tablet Take 500 mcg by mouth  daily. Patient not taking: Reported on 03/27/2024    [provider]  zoledronic acid (RECLAST) 5 MG/100ML SOLN injection Inject 5 mg into the vein once. Once a year    [provider]    Allergies: Fluoxetine    Review of Systems  Gastrointestinal:  Positive for abdominal pain.  Musculoskeletal:  Positive for back pain.  All other systems reviewed and are negative.   Updated Vital Signs BP 118/78   Pulse 81   Temp 97.8 F (36.6 C) (Oral)   Resp 16   SpO2 93%   Physical Exam Vitals and nursing note reviewed.  Constitutional:      Appearance: She is well-developed.  HENT:     Head: Normocephalic and atraumatic.  Cardiovascular:     Rate and Rhythm: Normal rate and regular rhythm.     Heart sounds: No murmur heard. Pulmonary:     Effort: Pulmonary effort is normal. No respiratory distress.     Breath sounds: Normal breath sounds.  Chest:     Chest wall: No tenderness.  Abdominal:     Palpations: Abdomen is soft.     Tenderness: There is no guarding or rebound.     Comments: Mild epigastric tenderness  Musculoskeletal:        General: No tenderness.     Comments: Trace edema to the left lower extremity.  1+ DP pulses to bilateral lower extremities  Skin:    General: Skin is warm and dry.     Findings: No rash.  Neurological:     Mental Status: She is alert and oriented to person, place, and time.  Psychiatric:        Behavior: Behavior normal.     (all labs ordered are listed, but only abnormal results are displayed) Labs Reviewed  LIPASE, BLOOD - Abnormal; Notable for the following components:      Result Value   Lipase 52 (*)    All other components within normal limits  COMPREHENSIVE METABOLIC PANEL WITH GFR - Abnormal; Notable for the following components:   Glucose, Bld 120 (*)    Total Protein 5.7 (*)    Albumin 2.8 (*)    All other components within normal limits  CBC - Abnormal; Notable for the following components:   WBC 11.5 (*)     RDW 16.2 (*)    Platelets 475 (*)    All other components within normal limits  URINALYSIS, ROUTINE W REFLEX MICROSCOPIC  TROPONIN I (HIGH SENSITIVITY)    EKG: EKG Interpretation Date/Time:  Wednesday March 29 2024 06:14:02 EDT Ventricular Rate:  75 PR Interval:  188 QRS Duration:  79 QT Interval:  405 QTC Calculation: 453 R Axis:   -18  Text Interpretation: Sinus rhythm Abnormal R-wave progression, early transition Inferior infarct, old Consider anterior infarct Confirmed by Griselda Norris [phone]) on  03/29/2024 6:18:25 AM  Radiology: DG Chest 2 View Result Date: 03/29/2024 EXAM: 2 VIEW(S) XRAY OF THE CHEST 03/29/2024 05:58:00 AM COMPARISON: 03/17/2024 CLINICAL HISTORY: Epigastric pain. Encounter for epigastric pain. FINDINGS: LUNGS AND PLEURA: Low lung volumes. Left base scarring. No focal pulmonary opacity. No pulmonary edema. No pleural effusion. No pneumothorax. HEART AND MEDIASTINUM: Mild cardiomegaly. Atherosclerosis in transverse aorta. BONES AND SOFT TISSUES: Remote left rib trauma. No acute osseous abnormality. IMPRESSION: 1. No acute findings. 2. Mild cardiomegaly and atherosclerosis in transverse aorta. 3. Low lung volumes and left base scarring. Electronically signed by: Evalene Coho MD 03/29/2024 06:19 AM EDT RP Workstation: [id number]     Procedures   Medications Ordered in the ED  morphine (PF) 2 MG/ML injection 2 mg (2 mg Intravenous Given 03/29/24 0627)  ondansetron  (ZOFRAN ) injection 4 mg (4 mg Intravenous Given 03/29/24 0627)  pantoprazole (PROTONIX) injection 40 mg (40 mg Intravenous Given 03/29/24 0654)                                    Medical Decision Making Amount and/or Complexity of Data Reviewed Labs: ordered. Radiology: ordered.  Risk Prescription drug management.   Patient with history of lung cancer, recent rib fracture, recent pneumonia here for evaluation of left-sided chest pain, abdominal pain and diaphoresis.  EKG is  nonischemic.  CBC with mild leukocytosis.  Chest x-ray without acute infiltrate.  Plan to treat patient's pain.  Patient care transferred pending CT scans, troponin.     Final diagnoses:  None    ED Discharge Orders     None          Griselda Norris, MD 03/29/24 [phone]

## 2024-03-29 NOTE — ED Notes (Signed)
 CCMD called.

## 2024-03-29 NOTE — ED Provider Notes (Signed)
 89 yo female w/ hx of lung cancer, presenting with severe left sided chest pain, diaphoresis and abdominal pain Coughing fit last night Took 4 ibuprofen  at home  Hospitalized for PNA about 1 week ago, also noted to have rib fractures subacute at the time  ECG shows NSR without acute ischemia  Dg chest on arrival with no emergent findings Pending troponin, CT angio chest and CT abdomen pelvis  Physical Exam  BP 92/64   Pulse 66   Temp 97.9 F (36.6 C) (Oral)   Resp 12   SpO2 96%   Physical Exam  Procedures  .Critical Care  Performed by: Cottie Donnice PARAS, MD Authorized by: Cottie Donnice PARAS, MD   Critical care provider statement:    Critical care time (minutes):  30   Critical care time was exclusive of:  Separately billable procedures and treating other patients   Critical care was necessary to treat or prevent imminent or life-threatening deterioration of the following conditions:  Circulatory failure   Critical care was time spent personally by me on the following activities:  Ordering and performing treatments and interventions, ordering and review of laboratory studies, ordering and review of radiographic studies, pulse oximetry, review of old charts, examination of patient and evaluation of patient's response to treatment Comments:     IV heparin for PE   ED Course / MDM   Clinical Course as of 03/29/24 1059  Wed Mar 29, 2024  0902 Dr Maximino Sharps hospitalist to admit.  [MT]    Clinical Course User Index [MT] Margie Urbanowicz, Donnice PARAS, MD   Medical Decision Making Amount and/or Complexity of Data Reviewed Labs: ordered. Radiology: ordered.  Risk Prescription drug management. Decision regarding hospitalization.   Ct findings discussed with patient, daughter at bedside, and son Mancel by phone.  We will initiate IV heparin.  Risks of bleeding discussed - family and patient want to pursue treatment.  We also discussed pancreatic mass which may correlated with her sharp  epigastric pain (worse post-prandial).  No transaminitis noted; lipase 52.  Will consult GI team for further recommendations.   11 am  - consulted GI [place], will leave note    Cottie Donnice PARAS, MD 03/29/24 1059

## 2024-03-29 NOTE — ED Notes (Signed)
 Pt placed on 2L O2

## 2024-03-29 NOTE — ED Notes (Signed)
 Patient transported to X-ray

## 2024-03-30 ENCOUNTER — Telehealth: Payer: Self-pay

## 2024-03-30 ENCOUNTER — Other Ambulatory Visit (HOSPITAL_COMMUNITY): Payer: Self-pay

## 2024-03-30 ENCOUNTER — Telehealth (HOSPITAL_COMMUNITY): Payer: Self-pay | Admitting: Pharmacy Technician

## 2024-03-30 DIAGNOSIS — J4540 Moderate persistent asthma, uncomplicated: Secondary | ICD-10-CM | POA: Diagnosis not present

## 2024-03-30 DIAGNOSIS — K862 Cyst of pancreas: Secondary | ICD-10-CM | POA: Diagnosis not present

## 2024-03-30 DIAGNOSIS — I10 Essential (primary) hypertension: Secondary | ICD-10-CM | POA: Diagnosis not present

## 2024-03-30 DIAGNOSIS — I2694 Multiple subsegmental pulmonary emboli without acute cor pulmonale: Secondary | ICD-10-CM | POA: Diagnosis not present

## 2024-03-30 LAB — COMPREHENSIVE METABOLIC PANEL WITH GFR
ALT: 21 U/L (ref 0–44)
AST: 14 U/L — ABNORMAL LOW (ref 15–41)
Albumin: 2.4 g/dL — ABNORMAL LOW (ref 3.5–5.0)
Alkaline Phosphatase: 49 U/L (ref 38–126)
Anion gap: 7 (ref 5–15)
BUN: 30 mg/dL — ABNORMAL HIGH (ref 8–23)
CO2: 28 mmol/L (ref 22–32)
Calcium: 8.9 mg/dL (ref 8.9–10.3)
Chloride: 102 mmol/L (ref 98–111)
Creatinine, Ser: 1.22 mg/dL — ABNORMAL HIGH (ref 0.44–1.00)
GFR, Estimated: 42 mL/min — ABNORMAL LOW (ref >60)
Glucose, Bld: 111 mg/dL — ABNORMAL HIGH (ref 70–99)
Potassium: 4.7 mmol/L (ref 3.5–5.1)
Sodium: 137 mmol/L (ref 135–145)
Total Bilirubin: 0.2 mg/dL (ref 0.0–1.2)
Total Protein: 5.4 g/dL — ABNORMAL LOW (ref 6.5–8.1)

## 2024-03-30 LAB — URINALYSIS, ROUTINE W REFLEX MICROSCOPIC
Bacteria, UA: NONE SEEN
Bilirubin Urine: NEGATIVE
Glucose, UA: NEGATIVE mg/dL
Ketones, ur: NEGATIVE mg/dL
Leukocytes,Ua: NEGATIVE
Nitrite: NEGATIVE
Protein, ur: NEGATIVE mg/dL
Specific Gravity, Urine: 1.005 (ref 1.005–1.030)
pH: 6 (ref 5.0–8.0)

## 2024-03-30 MED ORDER — TRAMADOL HCL 50 MG PO TABS
50.0000 mg | ORAL_TABLET | Freq: Once | ORAL | Status: AC
  Administered 2024-03-30: 50 mg via ORAL
  Filled 2024-03-30: qty 1

## 2024-03-30 MED ORDER — BUDESON-GLYCOPYRROL-FORMOTEROL 160-9-4.8 MCG/ACT IN AERO
2.0000 | INHALATION_SPRAY | Freq: Two times a day (BID) | RESPIRATORY_TRACT | Status: DC
  Filled 2024-03-30: qty 5.9

## 2024-03-30 MED ORDER — BUDESON-GLYCOPYRROL-FORMOTEROL 160-9-4.8 MCG/ACT IN AERO
2.0000 | INHALATION_SPRAY | Freq: Two times a day (BID) | RESPIRATORY_TRACT | Status: DC
  Administered 2024-03-31: 2 via RESPIRATORY_TRACT
  Filled 2024-03-30: qty 5.9

## 2024-03-30 MED ORDER — SULFAMETHOXAZOLE-TRIMETHOPRIM 400-80 MG PO TABS
1.0000 | ORAL_TABLET | Freq: Two times a day (BID) | ORAL | Status: DC
  Administered 2024-03-30 – 2024-03-31 (×2): 1 via ORAL
  Filled 2024-03-30 (×3): qty 1

## 2024-03-30 NOTE — Telephone Encounter (Signed)
-----   Message from Vina Dasen sent at 03/29/2024  5:01 PM EDT ----- Hi POD A Triage,   Please contact this patient in 6 months to arrange for an MRI/MRCP under Dr. London name.   Will probably need to be in touch with her daughter to make the arrangements.  Diagnosis is pancreatic cysts.    Thanks

## 2024-03-30 NOTE — Hospital Course (Signed)
 89 y.o. female with medical history significant of asthma, hypertension, and history of lung cancer(adenocarcinoma) s/p radiation therapy  severe abdominal and back pain. Subsequently diagnosed with PE.   Assessment and Plan:   Acute pulmonary embolism - CTA noting LLE segmental and subsegmental PE.  No right heart strain appreciated on CT nor echo.  Initiated on heparin drip, subsequently transition to p.o. Eliquis.  Currently 93% on room air, with some dyspnea on exertion.  Intermittently on 2 L nasal cannula.  Patient will be on anticoagulation for at least 3 months.   Pancreatic cyst - CT imaging noting 17 x 10 x 20 mm proximal pancreatic body cyst concerning for intraductal papillary neoplasm.  GI consulted with recommendations to repeat MRI/MRCP in 6 months.  Given ongoing evaluation and treatment would involve surgical intervention, discussion with patient and family stating that she would not be considered a surgical candidate nor which she to pursue such treatment options.  No urgent need for intervention at this time.   Moderate persistent asthma - Albuterol  as needed.   Hypertension - Continue losartan .  Holding amlodipine  for now.   History of lung adenocarcinoma LLL s/p radiation - Noted on CT imaging.  Follows with oncology in the outpatient setting.  May be contributing to patient's coagulation.   Dysuria - Will order UA.  If noting bacteria or concern for infection will initiate on empiric ceftriaxone .   Goals of care - Disposition dependent on patient's dyspnea.  If she can tolerate and ambulate off oxygen she may request to go home.  Anticipate discharge today or tomorrow.

## 2024-03-30 NOTE — Telephone Encounter (Signed)
 Patient Product/process development scientist completed.    The patient is insured through [employer]. Patient has Medicare and is not eligible for a copay card, but may be able to apply for patient assistance or Medicare RX Payment Plan (Patient Must reach out to their plan, if eligible for payment plan), if available.    Ran test claim for Eliquis 5 mg and the current 30 day co-pay is $0.00.   This test claim was processed through [facility]- copay amounts may vary at other pharmacies due to pharmacy/plan contracts, or as the patient moves through the different stages of their insurance plan.     Reyes Sharps, CPHT Pharmacy Technician Patient Advocate Specialist Lead [facility] Patient Advocate Team Direct Number: [phone]  Fax: [phone]

## 2024-03-30 NOTE — Plan of Care (Signed)
  Problem: Education: Goal: Knowledge of General Education information will improve Description: Including pain rating scale, medication(s)/side effects and non-pharmacologic comfort measures Outcome: Progressing   Problem: Clinical Measurements: Goal: Cardiovascular complication will be avoided Outcome: Progressing   Problem: Nutrition: Goal: Adequate nutrition will be maintained Outcome: Progressing   Problem: Coping: Goal: Level of anxiety will decrease Outcome: Progressing   

## 2024-03-30 NOTE — Discharge Instructions (Signed)
 Information on my medicine - ELIQUIS (apixaban)  Why was Eliquis prescribed for you? Eliquis was prescribed to treat blood clots that may have been found in the veins of your legs (deep vein thrombosis) or in your lungs (pulmonary embolism) and to reduce the risk of them occurring again.  What do You need to know about Eliquis ? The starting dose is 10 mg (two 5 mg tablets) taken TWICE daily for the FIRST SEVEN (7) DAYS, then on  04/05/2024 evening dose is reduced to ONE 5 mg tablet taken TWICE daily.  Eliquis may be taken with or without food.   Try to take the dose about the same time in the morning and in the evening. If you have difficulty swallowing the tablet whole please discuss with your pharmacist how to take the medication safely.  Take Eliquis exactly as prescribed and DO NOT stop taking Eliquis without talking to the doctor who prescribed the medication.  Stopping may increase your risk of developing a new blood clot.  Refill your prescription before you run out.  After discharge, you should have regular check-up appointments with your healthcare provider that is prescribing your Eliquis.    What do you do if you miss a dose? If a dose of ELIQUIS is not taken at the scheduled time, take it as soon as possible on the same day and twice-daily administration should be resumed. The dose should not be doubled to make up for a missed dose.  Important Safety Information A possible side effect of Eliquis is bleeding. You should call your healthcare provider right away if you experience any of the following: Bleeding from an injury or your nose that does not stop. Unusual colored urine (red or dark brown) or unusual colored stools (red or black). Unusual bruising for unknown reasons. A serious fall or if you hit your head (even if there is no bleeding).  Some medicines may interact with Eliquis and might increase your risk of bleeding or clotting while on Eliquis. To help avoid  this, consult your healthcare provider or pharmacist prior to using any new prescription or non-prescription medications, including herbals, vitamins, non-steroidal anti-inflammatory drugs (NSAIDs) and supplements.  This website has more information on Eliquis (apixaban): http://www.eliquis.com/eliquis/home

## 2024-03-30 NOTE — Care Management Obs Status (Signed)
 MEDICARE OBSERVATION STATUS NOTIFICATION   Patient Details  Name: Sandra Cooper MRN: [id number] Date of Birth: 03-Nov-1929   Medicare Observation Status Notification Given:  Yes     Verbally reviewed observation notice with Adiana Smelcer telephonically a [phone]  Patients son request a copy of this notice be sent to his email address [email or web address]  [name] 03/30/2024, 8:29 AM

## 2024-03-30 NOTE — Progress Notes (Signed)
 Progress Note   Patient: Sandra Cooper FMW:[id number] DOB: 1930/01/09 DOA: 03/29/2024  DOS: the patient was seen and examined on 03/30/2024   Brief hospital course:   89 y.o. female with medical history significant of asthma, hypertension, and history of lung cancer(adenocarcinoma) s/p radiation therapy  severe abdominal and back pain. Subsequently diagnosed with PE.  Assessment and Plan:  Acute pulmonary embolism - CTA noting LLE segmental and subsegmental PE.  No right heart strain appreciated on CT nor echo.  Initiated on heparin drip, subsequently transition to p.o. Eliquis.  Currently 93% on room air, with some dyspnea on exertion.  Intermittently on 2 L nasal cannula.  Patient will be on anticoagulation for at least 3 months.  Pancreatic cyst - CT imaging noting 17 x 10 x 20 mm proximal pancreatic body cyst concerning for intraductal papillary neoplasm.  GI consulted with recommendations to repeat MRI/MRCP in 6 months.  Given ongoing evaluation and treatment would involve surgical intervention, discussion with patient and family stating that she would not be considered a surgical candidate nor which she to pursue such treatment options.  No urgent need for intervention at this time.  Moderate persistent asthma - Albuterol  as needed.  Hypertension - Continue losartan .  Holding amlodipine  for now.  History of lung adenocarcinoma LLL s/p radiation - Noted on CT imaging.  Follows with oncology in the outpatient setting.  May be contributing to patient's coagulation.  Dysuria - Will order UA.  If noting bacteria or concern for infection will initiate on empiric ceftriaxone .  Goals of care - Disposition dependent on patient's dyspnea.  If she can tolerate and ambulate off oxygen she may request to go home.  Anticipate discharge today or tomorrow.  Subjective: Patient resting comfortably in bed, daughter at bedside, son on telephone.  Patient states she feels improved concerning her chest  pain/abdominal pain.  Still feels somewhat short of breath on exertion and tired.  Admits to cough productive of clearish yellow sputum.  Denies any fever, worsening shortness of breath, chest pain, nausea, vomiting, abdominal pain.  Had long discussion with the patient and family discussing her diagnosis, treatments, disposition.  Physical Exam:  Vitals:   03/29/24 2024 03/30/24 0014 03/30/24 0426 03/30/24 0751  BP: 120/60 (!) 151/83 130/75 (!) 148/73  Pulse: 81 76 81 78  Resp: 20 20 18 18   Temp: 98.8 F (37.1 C) 98.3 F (36.8 C) 99.4 F (37.4 C) 98.1 F (36.7 C)  TempSrc: Oral Oral Oral Oral  SpO2: 95% 97% 98% 93%    GENERAL:  Alert, pleasant, no acute distress, thin HEENT:  EOMI, nasal cannula CARDIOVASCULAR:  RRR, no murmurs appreciated RESPIRATORY:  Clear to auscultation, no wheezing, rales, or rhonchi GASTROINTESTINAL:  Soft, nontender, nondistended EXTREMITIES: Thin, no LE edema bilaterally NEURO:  No new focal deficits appreciated SKIN:  No rashes noted PSYCH:  Appropriate mood and affect, anxious    Data Reviewed:  Imaging Studies: ECHOCARDIOGRAM COMPLETE Result Date: 03/29/2024    ECHOCARDIOGRAM REPORT   Patient Name:   Sandra Cooper   Date of Exam: 03/29/2024 Medical Rec #:  [id number]  Height:       58.0 in Accession #:    [id number] Weight:       115.0 lb Date of Birth:  [birth year]  BSA:          1.439 m Patient Age:    89 years   BP:           104/64 mmHg Patient Gender: F  HR:           64 bpm. Exam Location:  Inpatient Procedure: 2D Echo (Both Spectral and Color Flow Doppler were utilized during            procedure). Indications:    Pulmonary Embolus  History:        Patient has prior history of Echocardiogram examinations. COPD;                 Risk Factors:Hypertension.  Sonographer:    Charmaine Gaskins Referring Phys: MAXIMINO DELENA SHARPS  Sonographer Comments: Technically difficult study due to poor echo windows. Image acquisition challenging due to COPD.  IMPRESSIONS  1. Left ventricular ejection fraction, by estimation, is 65 to 70%. Left ventricular ejection fraction by PLAX is 66 %. The left ventricle has normal function. The left ventricle has no regional wall motion abnormalities. There is mild asymmetric left ventricular hypertrophy of the basal-septal segment. Left ventricular diastolic parameters are consistent with Grade I diastolic dysfunction (impaired relaxation).  2. Right ventricular systolic function is normal. The right ventricular size is normal. There is normal pulmonary artery systolic pressure. The estimated right ventricular systolic pressure is 13.8 mmHg.  3. The mitral valve is grossly normal. Trivial mitral valve regurgitation.  4. The aortic valve is tricuspid. Aortic valve regurgitation is not visualized.  5. Aortic dilatation noted. There is mild dilatation of the ascending aorta, measuring 42 mm.  6. The inferior vena cava is normal in size with greater than 50% respiratory variability, suggesting right atrial pressure of 3 mmHg. Comparison(s): No significant change from prior study. 03/11/2019: LVEF 65-70%. FINDINGS  Left Ventricle: Left ventricular ejection fraction, by estimation, is 65 to 70%. Left ventricular ejection fraction by PLAX is 66 %. The left ventricle has normal function. The left ventricle has no regional wall motion abnormalities. The left ventricular internal cavity size was normal in size. There is mild asymmetric left ventricular hypertrophy of the basal-septal segment. Left ventricular diastolic parameters are consistent with Grade I diastolic dysfunction (impaired relaxation). Indeterminate filling pressures. Right Ventricle: The right ventricular size is normal. No increase in right ventricular wall thickness. Right ventricular systolic function is normal. There is normal pulmonary artery systolic pressure. The tricuspid regurgitant velocity is 1.64 m/s, and  with an assumed right atrial pressure of 3 mmHg, the  estimated right ventricular systolic pressure is 13.8 mmHg. Left Atrium: Left atrial size was normal in size. Right Atrium: Right atrial size was normal in size. Pericardium: There is no evidence of pericardial effusion. Mitral Valve: The mitral valve is grossly normal. Trivial mitral valve regurgitation. Tricuspid Valve: The tricuspid valve is grossly normal. Tricuspid valve regurgitation is trivial. Aortic Valve: The aortic valve is tricuspid. Aortic valve regurgitation is not visualized. Pulmonic Valve: The pulmonic valve was normal in structure. Pulmonic valve regurgitation is not visualized. Aorta: Aortic dilatation noted. There is mild dilatation of the ascending aorta, measuring 42 mm. Venous: The inferior vena cava is normal in size with greater than 50% respiratory variability, suggesting right atrial pressure of 3 mmHg. IAS/Shunts: No atrial level shunt detected by color flow Doppler.  LEFT VENTRICLE PLAX 2D LV EF:         Left            Diastology                ventricular     LV e' medial:    6.53 cm/s  ejection        LV E/e' medial:  10.2                fraction by     LV e' lateral:   7.40 cm/s                PLAX is 66      LV E/e' lateral: 9.0                %. LVIDd:         3.90 cm LVIDs:         2.50 cm LV PW:         0.90 cm LV IVS:        0.80 cm LVOT diam:     2.10 cm LVOT Area:     3.46 cm  RIGHT VENTRICLE RV Basal diam:  2.90 cm RV Mid diam:    2.80 cm RV S prime:     12.90 cm/s TAPSE (M-mode): 2.1 cm LEFT ATRIUM           Index        RIGHT ATRIUM           Index LA diam:      3.00 cm 2.08 cm/m   RA Area:     10.60 cm LA Vol (A2C): 37.6 ml 26.12 ml/m  RA Volume:   19.70 ml  13.69 ml/m LA Vol (A4C): 31.7 ml 22.02 ml/m   AORTA Ao Root diam: 2.90 cm Ao Asc diam:  4.20 cm MITRAL VALVE                TRICUSPID VALVE MV Area (PHT): 2.95 cm     TR Peak grad:   10.8 mmHg MV Decel Time: 257 msec     TR Vmax:        164.00 cm/s MV E velocity: 66.60 cm/s MV A velocity: 108.00  cm/s  SHUNTS MV E/A ratio:  0.62         Systemic Diam: 2.10 cm Vinie Maxcy MD Electronically signed by Vinie Maxcy MD Signature Date/Time: 03/29/2024/4:11:09 PM    Final    CT ABDOMEN PELVIS W CONTRAST Result Date: 03/29/2024 EXAM: CT ABDOMEN AND PELVIS WITH CONTRAST 03/29/2024 07:19:31 AM TECHNIQUE: CT of the abdomen and pelvis was performed with the administration of 75 mL of iohexol  (OMNIPAQUE ) 350 MG/ML injection. Multiplanar reformatted images are provided for review. Automated exposure control, iterative reconstruction, and/or weight-based adjustment of the mA/kV was utilized to reduce the radiation dose to as low as reasonably achievable. COMPARISON: None available. CLINICAL HISTORY: Abdominal pain, acute, nonlocalized. Patient reports epigastric pain and left back pain onset this morning, denies injury or fall, no emesis or diarrhea. FINDINGS: LOWER CHEST: No acute abnormality. LIVER: There are several small circumscribed cysts within the liver. GALLBLADDER AND BILE DUCTS: Gallbladder is unremarkable. No biliary ductal dilatation. SPLEEN: No acute abnormality. PANCREAS: There is a cluster of cysts present within the proximal body of the pancreas, measuring approximately 17 x 10 x 20 mm, concerning for side branch intraductal papillary mucinous neoplasm. The main pancreatic duct is also dilated, measuring 7 mm in diameter. ADRENAL GLANDS: No acute abnormality. KIDNEYS, URETERS AND BLADDER: No stones in the kidneys or ureters. No hydronephrosis. No perinephric or periureteral stranding. Urinary bladder is unremarkable. GI AND BOWEL: There are numerous sigmoid diverticula present. Stomach demonstrates no acute abnormality. There is no bowel obstruction. PERITONEUM AND RETROPERITONEUM: No ascites. No free air. VASCULATURE: There  is extensive calcific atheromatous disease within the abdominal aorta and iliac arteries. LYMPH NODES: No lymphadenopathy. REPRODUCTIVE ORGANS: No acute abnormality. BONES AND  SOFT TISSUES: There is degenerative anterolisthesis at L4-L5 and multilevel degenerative disc disease within the lumbar spine. No acute osseous abnormality. No focal soft tissue abnormality. IMPRESSION: 1. 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 Recommend gastroenterology/surgical consultation and pancreas-protocol MRI/MRCP or EUS for further risk stratification. 2. Numerous sigmoid diverticula without evidence of diverticulitis. 3. Extensive calcific atherosclerosis of the abdominal aorta and iliac arteries. Electronically signed by: Evalene Coho MD 03/29/2024 07:59 AM EDT RP Workstation: [id number]   CT Angio Chest PE W/Cm &/Or Wo Cm Result Date: 03/29/2024 EXAM: CTA of the Chest with contrast for PE 03/29/2024 07:19:31 AM TECHNIQUE: CTA of the chest was performed without and with the administration of 75 mL of intravenous iohexol  (OMNIPAQUE ) 350 MG/ML injection. Multiplanar reformatted images are provided for review. MIP images are provided for review. Automated exposure control, iterative reconstruction, and/or weight based adjustment of the mA/kV was utilized to reduce the radiation dose to as low as reasonably achievable. COMPARISON: CT angiogram of the chest dated 03/17/2024. CLINICAL HISTORY: FINDINGS: PULMONARY ARTERIES: Pulmonary arteries are adequately opacified for evaluation. There are intraluminal filling defects present within segmental and subsegmental branches of the pulmonary arterial trees. The filling defects can be seen on images 158, 16, and 163 of series 16. There is also a questionable filling defect within the subsegmental artery of the left lower lobe on image 172. Main pulmonary artery is normal in caliber. MEDIASTINUM: The heart and pericardium demonstrate no acute abnormality. There is no evidence of right ventricular strain. There is mild  calcific coronary artery disease. There is moderate calcific atheromatous disease within the thoracic aorta. LYMPH NODES: No mediastinal, hilar or axillary lymphadenopathy. LUNGS AND PLEURA: There is dependent atelectasis within the right upper lobe. There are streaky peribronchovascular opacities also present within the lower lobes bilaterally. There is a nodular area of opacification/consolidation present in the left lung base measuring approximately 2.7 x 2.1 cm in cross-sectional diameter which merits follow up to ensure resolution. The nondependent lungs are clear. No pleural effusion or pneumothorax. UPPER ABDOMEN: Limited images of the upper abdomen are unremarkable. SOFT TISSUES AND BONES: There are healing fractures of the left lateral seventh, eighth and ninth ribs. IMPRESSION: 1. Segmental and subsegmental pulmonary emboli with a questionable additional subsegmental embolus in the left lower lobe. No evidence of right ventricular strain. 2. Nodular consolidation in the left lung base measuring approximately 2.7 x 2.1 cm; recommend short-interval non-contrast chest CT in 3 months to ensure resolution. If persistent or with suspicious features, consider further evaluation (e.g., PET/CT or tissue sampling) given size >2 cm and non-incidental consolidative appearance. 3. Healing fractures of the left lateral seventh, eighth, and ninth ribs. Electronically signed by: Evalene Coho MD 03/29/2024 07:53 AM EDT RP Workstation: [id number]   DG Chest 2 View Result Date: 03/29/2024 EXAM: 2 VIEW(S) XRAY OF THE CHEST 03/29/2024 05:58:00 AM COMPARISON: 03/17/2024 CLINICAL HISTORY: Epigastric pain. Encounter for epigastric pain. FINDINGS: LUNGS AND PLEURA: Low lung volumes. Left base scarring. No focal pulmonary opacity. No pulmonary edema. No pleural effusion. No pneumothorax. HEART AND MEDIASTINUM: Mild cardiomegaly. Atherosclerosis in transverse aorta. BONES AND SOFT TISSUES: Remote left rib trauma. No acute  osseous abnormality. IMPRESSION: 1. No acute findings. 2. Mild cardiomegaly and atherosclerosis in  transverse aorta. 3. Low lung volumes and left base scarring. Electronically signed by: Evalene Coho MD 03/29/2024 06:19 AM EDT RP Workstation: [id number]   VAS US  LOWER EXTREMITY VENOUS (DVT) (7a-7p) Result Date: 03/18/2024  Lower Venous DVT Study Patient Name:  KIMI BORDEAU   Date of Exam:   03/17/2024 Medical Rec #: [id number]  Accession #:    [id number] Date of Birth: 06-06-1929  Patient Gender: F Patient Age:   48 years Exam Location:  [facility] Procedure:      VAS US  LOWER EXTREMITY VENOUS (DVT) Referring Phys: CHRISTIAN PROSPERI --------------------------------------------------------------------------------  Indications: Pain of RLE x18 hours, and SOB.  Comparison Study: No previous RLEV exams. Previous LLEV on 11/09/2023 was                   negative for DVT Performing Technologist: Alberta Lis RVS  Examination Guidelines: A complete evaluation includes B-mode imaging, spectral Doppler, color Doppler, and power Doppler as needed of all accessible portions of each vessel. Bilateral testing is considered an integral part of a complete examination. Limited examinations for reoccurring indications may be performed as noted. The reflux portion of the exam is performed with the patient in reverse Trendelenburg.  +---------+---------------+---------+-----------+----------+--------------+ RIGHT    CompressibilityPhasicitySpontaneityPropertiesThrombus Aging +---------+---------------+---------+-----------+----------+--------------+ CFV      Full           Yes      Yes                                 +---------+---------------+---------+-----------+----------+--------------+ SFJ      Full                                                        +---------+---------------+---------+-----------+----------+--------------+ FV Prox  Full           Yes      Yes                                  +---------+---------------+---------+-----------+----------+--------------+ FV Mid   Full                                                        +---------+---------------+---------+-----------+----------+--------------+ FV DistalFull                                                        +---------+---------------+---------+-----------+----------+--------------+ PFV      Full                                                        +---------+---------------+---------+-----------+----------+--------------+ POP      Full           Yes      Yes                                 +---------+---------------+---------+-----------+----------+--------------+  PTV      Full                                                        +---------+---------------+---------+-----------+----------+--------------+ PERO     Full                                                        +---------+---------------+---------+-----------+----------+--------------+ ATV      Full                                                        +---------+---------------+---------+-----------+----------+--------------+   +----+---------------+---------+-----------+----------+--------------+ LEFTCompressibilityPhasicitySpontaneityPropertiesThrombus Aging +----+---------------+---------+-----------+----------+--------------+ CFV Full           Yes                                          +----+---------------+---------+-----------+----------+--------------+     Summary: RIGHT: - There is no evidence of deep vein thrombosis in the lower extremity.  - No cystic structure found in the popliteal fossa.  LEFT: - No evidence of common femoral vein obstruction.   *See table(s) above for measurements and observations. Electronically signed by Lonni Gaskins MD on 03/18/2024 at 9:33:34 AM.    Final    CT Angio Chest PE W and/or Wo Contrast Result Date: 03/17/2024 CLINICAL DATA:  Shortness of  breath. Fever. Weakness. History of lung cancer. * Tracking Code: BO * EXAM: CT ANGIOGRAPHY CHEST WITH CONTRAST TECHNIQUE: Multidetector CT imaging of the chest was performed using the standard protocol during bolus administration of intravenous contrast. Multiplanar CT image reconstructions and MIPs were obtained to evaluate the vascular anatomy. RADIATION DOSE REDUCTION: This exam was performed according to the departmental dose-optimization program which includes automated exposure control, adjustment of the mA and/or kV according to patient size and/or use of iterative reconstruction technique. CONTRAST:  75mL OMNIPAQUE  IOHEXOL  350 MG/ML SOLN COMPARISON:  Chest radiograph earlier today. Most recent CT of 06/16/2021. FINDINGS: Cardiovascular: The quality of this exam for evaluation of pulmonary embolism is good. No evidence of pulmonary embolism. Advanced aortic atherosclerosis. Tortuous thoracic aorta. Mild cardiomegaly, without pericardial effusion. Lad coronary artery calcification. Mediastinum/Nodes: Left-sided thyroid  nodule of 1.7 cm is similar on the prior exam and is of doubtful clinical significance given patient age and comorbidities. Prominent middle mediastinal nodes, none pathologic by size criteria. No hilar adenopathy. Small hiatal hernia. Lungs/Pleura: No pleural fluid. Right lower lobe volume loss and subsegmental atelectasis. Multifocal primarily right-sided pneumonia. Posterior right upper lobe consolidation, relatively mild on 54/6. Multifocal areas of clustered tree-in-bud nodularity including the posterolateral right upper lobe on 46/6 and right lower lobe on 69/6. Relatively similar left lower lobe presumably treatment related scarring. The treated primary is relatively similar in morphology, measuring 3.2 x 2.5 cm on 77/6 versus 3.6 x 2.2 cm on the prior. Minimal right upper lobe nodularity is favored to be due to mucoid impaction on 32/6.  Upper Abdomen: Segment 2 12 mm hepatic cyst.  Normal imaged portions of the spleen, pancreas, adrenal glands, kidneys. Proximal gastric underdistention. Musculoskeletal: Again identified are multiple left rib fractures. The posterolateral left ninth rib is fragmented and irregular, at the site of a displaced fracture on 06/16/2021. Example image 80/5. Severe T3 compression deformity is similar, without significant ventral canal encroachment. Review of the MIP images confirms the above findings. IMPRESSION: 1. No evidence of pulmonary embolism. 2. Multifocal, significantly right-greater-than-left pneumonia. 3. Relatively similar appearance and size of a treated left lower lobe primary. No typical findings of soft tissue metastasis. 4. Multiple left rib fractures again identified. The ninth posterolateral left rib is fragmented and irregular. Most likely related to incomplete healing and surrounding heterotopic ossification. Osseous metastasis cannot be entirely excluded, but felt less likely given lack of disease elsewhere. 5. Incidental findings, including: Coronary artery atherosclerosis. Aortic Atherosclerosis (ICD10-I70.0). Electronically Signed   By: Rockey Kilts M.D.   On: 03/17/2024 16:43   DG Chest Port 1 View Result Date: 03/17/2024 EXAM: 1 VIEW XRAY OF THE CHEST 03/17/2024 02:57:00 PM COMPARISON: 03/19/2022 CLINICAL HISTORY: Sepsis (HCC) [id number]. Sepsis FINDINGS: LUNGS AND PLEURA: Stable left basilar density is noted consistent with scarring. Mild right mid lung scarring or subsegmental atelectasis is noted. No pulmonary edema. No pleural effusion. No pneumothorax. HEART AND MEDIASTINUM: Stable cardiomediastinal silhouette. BONES AND SOFT TISSUES: Possible destructive lesion seen involving lateral portion of left eighth rib concerning for metastatic disease. IMPRESSION: 1. Possible destructive lesion involving the lateral portion of the left eighth rib, concerning for metastatic disease. 2. Stable left basilar scarring. 3. Mild right mid lung  scarring or subsegmental atelectasis. Electronically signed by: Lynwood Seip MD 03/17/2024 03:41 PM EDT RP Workstation: [id number]    There are no new results to review at this time.  Previous records (including but not limited to H&P, progress notes, nursing notes, TOC management) were reviewed in assessment of this patient.  Labs: CBC: Recent Labs  Lab 03/29/24 0532  WBC 11.5*  HGB 13.0  HCT 39.9  MCV 88.7  PLT 475*   Basic Metabolic Panel: Recent Labs  Lab 03/29/24 0532 03/30/24 0342  NA 136 137  K 4.1 4.7  CL 104 102  CO2 22 28  GLUCOSE 120* 111*  BUN 22 30*  CREATININE 0.78 1.22*  CALCIUM 8.9 8.9   Liver Function Tests: Recent Labs  Lab 03/29/24 0532 03/30/24 0342  AST 17 14*  ALT 28 21  ALKPHOS 56 49  BILITOT 0.8 0.2  PROT 5.7* 5.4*  ALBUMIN 2.8* 2.4*   CBG: Recent Labs  Lab 03/23/24 1222  GLUCAP 151*    Scheduled Meds:  apixaban  10 mg Oral BID   Followed by   NOREEN ON 04/05/2024] apixaban  5 mg Oral BID   calcium carbonate  1 tablet Oral BID WC   losartan   50 mg Oral QHS   mirtazapine   15 mg Oral QHS   pantoprazole  40 mg Oral Daily   sodium chloride  flush  3 mL Intravenous Q12H   Continuous Infusions: PRN Meds:.acetaminophen  **OR** acetaminophen , albuterol , HYDROcodone -acetaminophen , morphine injection, ondansetron  **OR** ondansetron  (ZOFRAN ) IV, polyethylene glycol, simethicone   Family Communication: Daughter at bedside, son over phone  Disposition: Status is: Observation The patient remains OBS appropriate and will d/c before 2 midnights.     Time spent: 39 minutes  Length of inpatient stay: 0 days  Author: Carliss LELON Canales, DO 03/30/2024 11:36 AM  For on call review [email or web address].

## 2024-03-30 NOTE — Telephone Encounter (Signed)
 Reminder entered in epic.

## 2024-03-30 NOTE — Progress Notes (Signed)
 Transition of Care [facility]) - Inpatient Brief Assessment   Patient Details  Name: Sandra Cooper MRN: [id number] Date of Birth: 07-17-1929  Transition of [facility]) CM/SW Contact:    Rosaline JONELLE Joe, RN Phone Number: 03/30/2024, 12:40 PM   Clinical Narrative: Patient admitted to the hospital from home alone with PE.  Patient was recently discharged from the hospital and patient was transferred to [facility] and decided immediately that she did not want to stay at the facility for care and left home AMA.  Patient was set up with [facility] by myself and daughter states that Hedda is active with the patient at the home.  DME in the home includes Cane shower seat, rolator, toilet riser.  Patient states that she has a bedside commode and does not need one.  Patient is on RA at home.  Patient is currently wearing oxygen in the hospital and is waiting on US  at this time to r/o DVT.  CM will continue to follow the patient for Ip Care management needs as patient progresses.   Transition of Care Asessment: Insurance and Status: (P) Insurance coverage has been reviewed Patient has primary care physician: (P) Yes Home environment has been reviewed: (P) from home alone Prior level of function:: (P) Rolator Prior/Current Home Services: (P) Current home services (Active with [employer], DME  at the home includes Rolator, RW, Cane, shower chair, toilet riser) Social Drivers of Health Review: (P) SDOH reviewed needs interventions Readmission risk has been reviewed: (P) Yes Transition of care needs: (P) transition of care needs identified, TOC will continue to follow

## 2024-03-31 ENCOUNTER — Other Ambulatory Visit (HOSPITAL_COMMUNITY): Payer: Self-pay

## 2024-03-31 ENCOUNTER — Observation Stay (HOSPITAL_COMMUNITY)

## 2024-03-31 DIAGNOSIS — I10 Essential (primary) hypertension: Secondary | ICD-10-CM | POA: Diagnosis not present

## 2024-03-31 DIAGNOSIS — D490 Neoplasm of unspecified behavior of digestive system: Secondary | ICD-10-CM

## 2024-03-31 DIAGNOSIS — I2694 Multiple subsegmental pulmonary emboli without acute cor pulmonale: Secondary | ICD-10-CM | POA: Diagnosis not present

## 2024-03-31 DIAGNOSIS — J4540 Moderate persistent asthma, uncomplicated: Secondary | ICD-10-CM | POA: Diagnosis not present

## 2024-03-31 DIAGNOSIS — K862 Cyst of pancreas: Secondary | ICD-10-CM | POA: Diagnosis not present

## 2024-03-31 LAB — CBC
HCT: 36.8 % (ref 36.0–46.0)
Hemoglobin: 11.6 g/dL — ABNORMAL LOW (ref 12.0–15.0)
MCH: 28.4 pg (ref 26.0–34.0)
MCHC: 31.5 g/dL (ref 30.0–36.0)
MCV: 90.2 fL (ref 80.0–100.0)
Platelets: 399 K/uL (ref 150–400)
RBC: 4.08 MIL/uL (ref 3.87–5.11)
RDW: 16 % — ABNORMAL HIGH (ref 11.5–15.5)
WBC: 9 K/uL (ref 4.0–10.5)
nRBC: 0 % (ref 0.0–0.2)

## 2024-03-31 LAB — BASIC METABOLIC PANEL WITH GFR
Anion gap: 6 (ref 5–15)
BUN: 31 mg/dL — ABNORMAL HIGH (ref 8–23)
CO2: 29 mmol/L (ref 22–32)
Calcium: 9.1 mg/dL (ref 8.9–10.3)
Chloride: 101 mmol/L (ref 98–111)
Creatinine, Ser: 0.91 mg/dL (ref 0.44–1.00)
GFR, Estimated: 59 mL/min — ABNORMAL LOW (ref >60)
Glucose, Bld: 112 mg/dL — ABNORMAL HIGH (ref 70–99)
Potassium: 4.4 mmol/L (ref 3.5–5.1)
Sodium: 136 mmol/L (ref 135–145)

## 2024-03-31 MED ORDER — HYDROCODONE-ACETAMINOPHEN 5-325 MG PO TABS
1.0000 | ORAL_TABLET | Freq: Four times a day (QID) | ORAL | 0 refills | Status: AC | PRN
  Filled 2024-03-31: qty 14, 4d supply, fill #0

## 2024-03-31 MED ORDER — APIXABAN (ELIQUIS) VTE STARTER PACK (10MG AND 5MG)
ORAL_TABLET | ORAL | 0 refills | Status: AC
  Filled 2024-03-31: qty 74, 30d supply, fill #0

## 2024-03-31 MED ORDER — SULFAMETHOXAZOLE-TRIMETHOPRIM 400-80 MG PO TABS
1.0000 | ORAL_TABLET | Freq: Two times a day (BID) | ORAL | 0 refills | Status: AC
  Filled 2024-03-31: qty 8, 4d supply, fill #0

## 2024-03-31 NOTE — Discharge Summary (Signed)
 Physician Discharge Summary   Patient: Sandra Cooper MRN: [id number] DOB: 1929-09-15  Admit date:     03/29/2024  Discharge date: 03/31/24  Discharge Physician: Carliss LELON Canales   PCP: Beam, Lamar POUR, MD   Recommendations at discharge:    Pt to be discharged home.   If you experience worsening fever, chills, chest pain, shortness of breath, or other concerning symptoms, please call your PCP or go to the emergency department immediately.  Discharge Diagnoses: Principal Problem:   Pulmonary embolus (HCC) Active Problems:   Leukocytosis   Pancreatic cyst   Moderate persistent asthma   Hypertension, essential   History of lung cancer   Depression   GERD (gastroesophageal reflux disease)  Resolved Problems:   * No resolved hospital problems. *   Hospital Course:  89 y.o. female with medical history significant of asthma, hypertension, and history of lung cancer(adenocarcinoma) s/p radiation therapy  severe abdominal and back pain. Subsequently diagnosed with PE.   Assessment and Plan:   Acute pulmonary embolism - CTA noting LLE segmental and subsegmental PE.  No right heart strain appreciated on CT nor echo.  Initiated on heparin drip, subsequently transition to p.o. Eliquis.  Some residual mild DOE.  Patient will be on anticoagulation for at least 3 months.  Will give prescription for Eliquis starter pack upon discharge.  Recommend follow-up with primary care physician in 3 months to discuss continuation of anticoagulation risks and benefits.   Pancreatic cyst - CT imaging noting 17 x 10 x 20 mm proximal pancreatic body cyst concerning for intraductal papillary neoplasm.  GI consulted with recommendations to repeat MRI/MRCP in 6 months.  Given ongoing evaluation and treatment would involve surgical intervention, discussion with patient and family stating that she would not be considered a surgical candidate nor which she to pursue such treatment options.  No urgent need for intervention  at this time.  Will give follow-up referral to GI for repeat imaging scheduling/follow-up.   Moderate persistent asthma - Resume home medications   Hypertension - Resume home losartan  and amlodipine    History of lung adenocarcinoma LLL s/p radiation - Noted on CT imaging.  Follows with oncology in the outpatient setting.  May be contributing to patient's coagulation.   Dysuria - UA noting no signs of infection, no WBCs, LE, bacteria.  Patient with persistent burning however and previous dehydration.  Give prescription for empiric Bactrim to take as directed upon discharge.  Goals of care - Walk test to determine need for home O2.  Discharge today.   Consultants: Gastroenterology Procedures performed: None Disposition: Home Diet recommendation:  Discharge Diet Orders (From admission, onward)     Start     Ordered   03/31/24 0000  Diet - low sodium heart healthy        03/31/24 1016           Cardiac diet  DISCHARGE MEDICATION: Allergies as of 03/31/2024       Reactions   Fluoxetine Swelling, Other (See Comments)   Facial swelling and shakiness        Medication List     STOP taking these medications    aspirin EC 81 MG tablet   ibuprofen  200 MG tablet Commonly known as: ADVIL    meloxicam  15 MG tablet Commonly known as: MOBIC    pantoprazole 40 MG tablet Commonly known as: PROTONIX       TAKE these medications    albuterol  108 (90 Base) MCG/ACT inhaler Commonly known as: VENTOLIN  HFA Inhale 2  puffs into the lungs every 6 (six) hours as needed for wheezing or shortness of breath.   amLODipine  2.5 MG tablet Commonly known as: NORVASC  Take 2.5 mg by mouth daily.   amLODipine  5 MG tablet Commonly known as: NORVASC  Take 5 mg by mouth daily.   Apixaban Starter Pack (10mg  and 5mg ) Commonly known as: ELIQUIS STARTER PACK Take as directed on package: start with two-5mg  tablets twice daily for 7 days. On day 8, switch to one-5mg  tablet twice daily.    Calcium-Vitamin D 600-400 MG-UNIT Tabs Take 1 tablet by mouth daily.   HYDROcodone -acetaminophen  5-325 MG tablet Commonly known as: NORCO/VICODIN Take 1 tablet by mouth every 6 (six) hours as needed for moderate pain (pain score 4-6).   losartan  50 MG tablet Commonly known as: COZAAR  Take 1 tablet (50 mg total) by mouth every evening. What changed: when to take this   magnesium  gluconate 500 MG tablet Commonly known as: MAGONATE Take 500 mg by mouth daily.   mirtazapine  15 MG tablet Commonly known as: REMERON  Take 15 mg by mouth at bedtime.   multivitamin tablet Take 1 tablet by mouth daily.   OMEGA 3 PO Take 1 tablet by mouth daily.   OVER THE COUNTER MEDICATION Take 1 tablet by mouth daily. Life Extension, Brain Support Supplement   polyethylene glycol 17 g packet Commonly known as: MIRALAX / GLYCOLAX Take 17 g by mouth daily as needed for mild constipation.   Potassium 99 MG Tabs Take 99 mg by mouth daily.   SIMETHICONE  PO Take 1 tablet by mouth with breakfast, with lunch, and with evening meal.   sulfamethoxazole-trimethoprim 400-80 MG tablet Commonly known as: BACTRIM Take 1 tablet by mouth every 12 (twelve) hours.   Trelegy Ellipta 200-62.5-25 MCG/ACT Aepb Generic drug: Fluticasone-Umeclidin-Vilant Inhale 1 puff into the lungs daily.   vitamin B-12 500 MCG tablet Commonly known as: CYANOCOBALAMIN  Take 500 mcg by mouth daily.   Vitamin D 50 MCG (2000 UT) Caps Take 2,000 Units by mouth daily.   zoledronic acid 5 MG/100ML Soln injection Commonly known as: RECLAST Inject 5 mg into the vein once. Once a year         Discharge Exam: There were no vitals filed for this visit.  GENERAL:  Alert, pleasant, no acute distress, thin HEENT:  EOMI, nasal cannula CARDIOVASCULAR:  RRR, no murmurs appreciated RESPIRATORY:  Clear to auscultation, no wheezing, rales, or rhonchi GASTROINTESTINAL:  Soft, nontender, nondistended EXTREMITIES: Thin, no LE edema  bilaterally NEURO:  No new focal deficits appreciated SKIN:  No rashes noted PSYCH:  Appropriate mood and affect, anxious   Condition at discharge: improving  The results of significant diagnostics from this hospitalization (including imaging, microbiology, ancillary and laboratory) are listed below for reference.   Imaging Studies: ECHOCARDIOGRAM COMPLETE Result Date: 03/29/2024    ECHOCARDIOGRAM REPORT   Patient Name:   Lyla Jasek   Date of Exam: 03/29/2024 Medical Rec #:  [id number]  Height:       58.0 in Accession #:    [id number] Weight:       115.0 lb Date of Birth:  [birth year]  BSA:          1.439 m Patient Age:    89 years   BP:           104/64 mmHg Patient Gender: F          HR:           64 bpm. Exam Location:  Inpatient Procedure:  2D Echo (Both Spectral and Color Flow Doppler were utilized during            procedure). Indications:    Pulmonary Embolus  History:        Patient has prior history of Echocardiogram examinations. COPD;                 Risk Factors:Hypertension.  Sonographer:    Charmaine Gaskins Referring Phys: MAXIMINO DELENA SHARPS  Sonographer Comments: Technically difficult study due to poor echo windows. Image acquisition challenging due to COPD. IMPRESSIONS  1. Left ventricular ejection fraction, by estimation, is 65 to 70%. Left ventricular ejection fraction by PLAX is 66 %. The left ventricle has normal function. The left ventricle has no regional wall motion abnormalities. There is mild asymmetric left ventricular hypertrophy of the basal-septal segment. Left ventricular diastolic parameters are consistent with Grade I diastolic dysfunction (impaired relaxation).  2. Right ventricular systolic function is normal. The right ventricular size is normal. There is normal pulmonary artery systolic pressure. The estimated right ventricular systolic pressure is 13.8 mmHg.  3. The mitral valve is grossly normal. Trivial mitral valve regurgitation.  4. The aortic valve is tricuspid. Aortic  valve regurgitation is not visualized.  5. Aortic dilatation noted. There is mild dilatation of the ascending aorta, measuring 42 mm.  6. The inferior vena cava is normal in size with greater than 50% respiratory variability, suggesting right atrial pressure of 3 mmHg. Comparison(s): No significant change from prior study. 03/11/2019: LVEF 65-70%. FINDINGS  Left Ventricle: Left ventricular ejection fraction, by estimation, is 65 to 70%. Left ventricular ejection fraction by PLAX is 66 %. The left ventricle has normal function. The left ventricle has no regional wall motion abnormalities. The left ventricular internal cavity size was normal in size. There is mild asymmetric left ventricular hypertrophy of the basal-septal segment. Left ventricular diastolic parameters are consistent with Grade I diastolic dysfunction (impaired relaxation). Indeterminate filling pressures. Right Ventricle: The right ventricular size is normal. No increase in right ventricular wall thickness. Right ventricular systolic function is normal. There is normal pulmonary artery systolic pressure. The tricuspid regurgitant velocity is 1.64 m/s, and  with an assumed right atrial pressure of 3 mmHg, the estimated right ventricular systolic pressure is 13.8 mmHg. Left Atrium: Left atrial size was normal in size. Right Atrium: Right atrial size was normal in size. Pericardium: There is no evidence of pericardial effusion. Mitral Valve: The mitral valve is grossly normal. Trivial mitral valve regurgitation. Tricuspid Valve: The tricuspid valve is grossly normal. Tricuspid valve regurgitation is trivial. Aortic Valve: The aortic valve is tricuspid. Aortic valve regurgitation is not visualized. Pulmonic Valve: The pulmonic valve was normal in structure. Pulmonic valve regurgitation is not visualized. Aorta: Aortic dilatation noted. There is mild dilatation of the ascending aorta, measuring 42 mm. Venous: The inferior vena cava is normal in size with  greater than 50% respiratory variability, suggesting right atrial pressure of 3 mmHg. IAS/Shunts: No atrial level shunt detected by color flow Doppler.  LEFT VENTRICLE PLAX 2D LV EF:         Left            Diastology                ventricular     LV e' medial:    6.53 cm/s                ejection        LV E/e' medial:  10.2  fraction by     LV e' lateral:   7.40 cm/s                PLAX is 66      LV E/e' lateral: 9.0                %. LVIDd:         3.90 cm LVIDs:         2.50 cm LV PW:         0.90 cm LV IVS:        0.80 cm LVOT diam:     2.10 cm LVOT Area:     3.46 cm  RIGHT VENTRICLE RV Basal diam:  2.90 cm RV Mid diam:    2.80 cm RV S prime:     12.90 cm/s TAPSE (M-mode): 2.1 cm LEFT ATRIUM           Index        RIGHT ATRIUM           Index LA diam:      3.00 cm 2.08 cm/m   RA Area:     10.60 cm LA Vol (A2C): 37.6 ml 26.12 ml/m  RA Volume:   19.70 ml  13.69 ml/m LA Vol (A4C): 31.7 ml 22.02 ml/m   AORTA Ao Root diam: 2.90 cm Ao Asc diam:  4.20 cm MITRAL VALVE                TRICUSPID VALVE MV Area (PHT): 2.95 cm     TR Peak grad:   10.8 mmHg MV Decel Time: 257 msec     TR Vmax:        164.00 cm/s MV E velocity: 66.60 cm/s MV A velocity: 108.00 cm/s  SHUNTS MV E/A ratio:  0.62         Systemic Diam: 2.10 cm Vinie Maxcy MD Electronically signed by Vinie Maxcy MD Signature Date/Time: 03/29/2024/4:11:09 PM    Final    CT ABDOMEN PELVIS W CONTRAST Result Date: 03/29/2024 EXAM: CT ABDOMEN AND PELVIS WITH CONTRAST 03/29/2024 07:19:31 AM TECHNIQUE: CT of the abdomen and pelvis was performed with the administration of 75 mL of iohexol  (OMNIPAQUE ) 350 MG/ML injection. Multiplanar reformatted images are provided for review. Automated exposure control, iterative reconstruction, and/or weight-based adjustment of the mA/kV was utilized to reduce the radiation dose to as low as reasonably achievable. COMPARISON: None available. CLINICAL HISTORY: Abdominal pain, acute, nonlocalized. Patient  reports epigastric pain and left back pain onset this morning, denies injury or fall, no emesis or diarrhea. FINDINGS: LOWER CHEST: No acute abnormality. LIVER: There are several small circumscribed cysts within the liver. GALLBLADDER AND BILE DUCTS: Gallbladder is unremarkable. No biliary ductal dilatation. SPLEEN: No acute abnormality. PANCREAS: There is a cluster of cysts present within the proximal body of the pancreas, measuring approximately 17 x 10 x 20 mm, concerning for side branch intraductal papillary mucinous neoplasm. The main pancreatic duct is also dilated, measuring 7 mm in diameter. ADRENAL GLANDS: No acute abnormality. KIDNEYS, URETERS AND BLADDER: No stones in the kidneys or ureters. No hydronephrosis. No perinephric or periureteral stranding. Urinary bladder is unremarkable. GI AND BOWEL: There are numerous sigmoid diverticula present. Stomach demonstrates no acute abnormality. There is no bowel obstruction. PERITONEUM AND RETROPERITONEUM: No ascites. No free air. VASCULATURE: There is extensive calcific atheromatous disease within the abdominal aorta and iliac arteries. LYMPH NODES: No lymphadenopathy. REPRODUCTIVE ORGANS: No acute abnormality. BONES AND SOFT TISSUES: There is degenerative  anterolisthesis at L4-L5 and multilevel degenerative disc disease within the lumbar spine. No acute osseous abnormality. No focal soft tissue abnormality. IMPRESSION: 1. 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 Recommend gastroenterology/surgical consultation and pancreas-protocol MRI/MRCP or EUS for further risk stratification. 2. Numerous sigmoid diverticula without evidence of diverticulitis. 3. Extensive calcific atherosclerosis of the abdominal aorta and iliac arteries. Electronically signed by: Evalene Coho MD 03/29/2024 07:59 AM EDT RP Workstation: [id number]    CT Angio Chest PE W/Cm &/Or Wo Cm Result Date: 03/29/2024 EXAM: CTA of the Chest with contrast for PE 03/29/2024 07:19:31 AM TECHNIQUE: CTA of the chest was performed without and with the administration of 75 mL of intravenous iohexol  (OMNIPAQUE ) 350 MG/ML injection. Multiplanar reformatted images are provided for review. MIP images are provided for review. Automated exposure control, iterative reconstruction, and/or weight based adjustment of the mA/kV was utilized to reduce the radiation dose to as low as reasonably achievable. COMPARISON: CT angiogram of the chest dated 03/17/2024. CLINICAL HISTORY: FINDINGS: PULMONARY ARTERIES: Pulmonary arteries are adequately opacified for evaluation. There are intraluminal filling defects present within segmental and subsegmental branches of the pulmonary arterial trees. The filling defects can be seen on images 158, 16, and 163 of series 16. There is also a questionable filling defect within the subsegmental artery of the left lower lobe on image 172. Main pulmonary artery is normal in caliber. MEDIASTINUM: The heart and pericardium demonstrate no acute abnormality. There is no evidence of right ventricular strain. There is mild calcific coronary artery disease. There is moderate calcific atheromatous disease within the thoracic aorta. LYMPH NODES: No mediastinal, hilar or axillary lymphadenopathy. LUNGS AND PLEURA: There is dependent atelectasis within the right upper lobe. There are streaky peribronchovascular opacities also present within the lower lobes bilaterally. There is a nodular area of opacification/consolidation present in the left lung base measuring approximately 2.7 x 2.1 cm in cross-sectional diameter which merits follow up to ensure resolution. The nondependent lungs are clear. No pleural effusion or pneumothorax. UPPER ABDOMEN: Limited images of the upper abdomen are unremarkable. SOFT TISSUES AND BONES: There are healing fractures of the left lateral  seventh, eighth and ninth ribs. IMPRESSION: 1. Segmental and subsegmental pulmonary emboli with a questionable additional subsegmental embolus in the left lower lobe. No evidence of right ventricular strain. 2. Nodular consolidation in the left lung base measuring approximately 2.7 x 2.1 cm; recommend short-interval non-contrast chest CT in 3 months to ensure resolution. If persistent or with suspicious features, consider further evaluation (e.g., PET/CT or tissue sampling) given size >2 cm and non-incidental consolidative appearance. 3. Healing fractures of the left lateral seventh, eighth, and ninth ribs. Electronically signed by: Evalene Coho MD 03/29/2024 07:53 AM EDT RP Workstation: [id number]   DG Chest 2 View Result Date: 03/29/2024 EXAM: 2 VIEW(S) XRAY OF THE CHEST 03/29/2024 05:58:00 AM COMPARISON: 03/17/2024 CLINICAL HISTORY: Epigastric pain. Encounter for epigastric pain. FINDINGS: LUNGS AND PLEURA: Low lung volumes. Left base scarring. No focal pulmonary opacity. No pulmonary edema. No pleural effusion. No pneumothorax. HEART AND MEDIASTINUM: Mild cardiomegaly. Atherosclerosis in transverse aorta. BONES AND SOFT TISSUES: Remote left rib trauma. No acute osseous abnormality. IMPRESSION: 1. No acute findings. 2. Mild cardiomegaly and atherosclerosis in transverse aorta. 3. Low lung volumes and left base scarring. Electronically signed by: Evalene Coho MD 03/29/2024 06:19 AM EDT RP Workstation: [id number]   VAS US  LOWER EXTREMITY  VENOUS (DVT) (7a-7p) Result Date: 03/18/2024  Lower Venous DVT Study Patient Name:  SHAHARA HARTSFIELD   Date of Exam:   03/17/2024 Medical Rec #: [id number]  Accession #:    [id number] Date of Birth: 08/05/1929  Patient Gender: F Patient Age:   53 years Exam Location:  [facility] Procedure:      VAS US  LOWER EXTREMITY VENOUS (DVT) Referring Phys: CHRISTIAN PROSPERI --------------------------------------------------------------------------------  Indications: Pain of  RLE x18 hours, and SOB.  Comparison Study: No previous RLEV exams. Previous LLEV on 11/09/2023 was                   negative for DVT Performing Technologist: Alberta Lis RVS  Examination Guidelines: A complete evaluation includes B-mode imaging, spectral Doppler, color Doppler, and power Doppler as needed of all accessible portions of each vessel. Bilateral testing is considered an integral part of a complete examination. Limited examinations for reoccurring indications may be performed as noted. The reflux portion of the exam is performed with the patient in reverse Trendelenburg.  +---------+---------------+---------+-----------+----------+--------------+ RIGHT    CompressibilityPhasicitySpontaneityPropertiesThrombus Aging +---------+---------------+---------+-----------+----------+--------------+ CFV      Full           Yes      Yes                                 +---------+---------------+---------+-----------+----------+--------------+ SFJ      Full                                                        +---------+---------------+---------+-----------+----------+--------------+ FV Prox  Full           Yes      Yes                                 +---------+---------------+---------+-----------+----------+--------------+ FV Mid   Full                                                        +---------+---------------+---------+-----------+----------+--------------+ FV DistalFull                                                        +---------+---------------+---------+-----------+----------+--------------+ PFV      Full                                                        +---------+---------------+---------+-----------+----------+--------------+ POP      Full           Yes      Yes                                 +---------+---------------+---------+-----------+----------+--------------+  PTV      Full                                                         +---------+---------------+---------+-----------+----------+--------------+ PERO     Full                                                        +---------+---------------+---------+-----------+----------+--------------+ ATV      Full                                                        +---------+---------------+---------+-----------+----------+--------------+   +----+---------------+---------+-----------+----------+--------------+ LEFTCompressibilityPhasicitySpontaneityPropertiesThrombus Aging +----+---------------+---------+-----------+----------+--------------+ CFV Full           Yes                                          +----+---------------+---------+-----------+----------+--------------+     Summary: RIGHT: - There is no evidence of deep vein thrombosis in the lower extremity.  - No cystic structure found in the popliteal fossa.  LEFT: - No evidence of common femoral vein obstruction.   *See table(s) above for measurements and observations. Electronically signed by Lonni Gaskins MD on 03/18/2024 at 9:33:34 AM.    Final    CT Angio Chest PE W and/or Wo Contrast Result Date: 03/17/2024 CLINICAL DATA:  Shortness of breath. Fever. Weakness. History of lung cancer. * Tracking Code: BO * EXAM: CT ANGIOGRAPHY CHEST WITH CONTRAST TECHNIQUE: Multidetector CT imaging of the chest was performed using the standard protocol during bolus administration of intravenous contrast. Multiplanar CT image reconstructions and MIPs were obtained to evaluate the vascular anatomy. RADIATION DOSE REDUCTION: This exam was performed according to the departmental dose-optimization program which includes automated exposure control, adjustment of the mA and/or kV according to patient size and/or use of iterative reconstruction technique. CONTRAST:  75mL OMNIPAQUE  IOHEXOL  350 MG/ML SOLN COMPARISON:  Chest radiograph earlier today. Most recent CT of 06/16/2021. FINDINGS: Cardiovascular: The  quality of this exam for evaluation of pulmonary embolism is good. No evidence of pulmonary embolism. Advanced aortic atherosclerosis. Tortuous thoracic aorta. Mild cardiomegaly, without pericardial effusion. Lad coronary artery calcification. Mediastinum/Nodes: Left-sided thyroid  nodule of 1.7 cm is similar on the prior exam and is of doubtful clinical significance given patient age and comorbidities. Prominent middle mediastinal nodes, none pathologic by size criteria. No hilar adenopathy. Small hiatal hernia. Lungs/Pleura: No pleural fluid. Right lower lobe volume loss and subsegmental atelectasis. Multifocal primarily right-sided pneumonia. Posterior right upper lobe consolidation, relatively mild on 54/6. Multifocal areas of clustered tree-in-bud nodularity including the posterolateral right upper lobe on 46/6 and right lower lobe on 69/6. Relatively similar left lower lobe presumably treatment related scarring. The treated primary is relatively similar in morphology, measuring 3.2 x 2.5 cm on 77/6 versus 3.6 x 2.2 cm on the prior. Minimal right upper lobe nodularity is favored to be due to mucoid impaction on  32/6. Upper Abdomen: Segment 2 12 mm hepatic cyst. Normal imaged portions of the spleen, pancreas, adrenal glands, kidneys. Proximal gastric underdistention. Musculoskeletal: Again identified are multiple left rib fractures. The posterolateral left ninth rib is fragmented and irregular, at the site of a displaced fracture on 06/16/2021. Example image 80/5. Severe T3 compression deformity is similar, without significant ventral canal encroachment. Review of the MIP images confirms the above findings. IMPRESSION: 1. No evidence of pulmonary embolism. 2. Multifocal, significantly right-greater-than-left pneumonia. 3. Relatively similar appearance and size of a treated left lower lobe primary. No typical findings of soft tissue metastasis. 4. Multiple left rib fractures again identified. The ninth  posterolateral left rib is fragmented and irregular. Most likely related to incomplete healing and surrounding heterotopic ossification. Osseous metastasis cannot be entirely excluded, but felt less likely given lack of disease elsewhere. 5. Incidental findings, including: Coronary artery atherosclerosis. Aortic Atherosclerosis (ICD10-I70.0). Electronically Signed   By: Rockey Kilts M.D.   On: 03/17/2024 16:43   DG Chest Port 1 View Result Date: 03/17/2024 EXAM: 1 VIEW XRAY OF THE CHEST 03/17/2024 02:57:00 PM COMPARISON: 03/19/2022 CLINICAL HISTORY: Sepsis (HCC) [id number]. Sepsis FINDINGS: LUNGS AND PLEURA: Stable left basilar density is noted consistent with scarring. Mild right mid lung scarring or subsegmental atelectasis is noted. No pulmonary edema. No pleural effusion. No pneumothorax. HEART AND MEDIASTINUM: Stable cardiomediastinal silhouette. BONES AND SOFT TISSUES: Possible destructive lesion seen involving lateral portion of left eighth rib concerning for metastatic disease. IMPRESSION: 1. Possible destructive lesion involving the lateral portion of the left eighth rib, concerning for metastatic disease. 2. Stable left basilar scarring. 3. Mild right mid lung scarring or subsegmental atelectasis. Electronically signed by: Lynwood Seip MD 03/17/2024 03:41 PM EDT RP Workstation: [id number]    Microbiology: Results for orders placed or performed during the hospital encounter of 03/17/24  Resp panel by RT-PCR (RSV, Flu A&B, Covid) Anterior Nasal Swab     Status: None   Collection Time: 03/17/24  1:58 PM   Specimen: Anterior Nasal Swab  Result Value Ref Range Status   SARS Coronavirus 2 by RT PCR NEGATIVE NEGATIVE Final   Influenza A by PCR NEGATIVE NEGATIVE Final   Influenza B by PCR NEGATIVE NEGATIVE Final    Comment: (NOTE) The Xpert Xpress SARS-CoV-2/FLU/RSV plus assay is intended as an aid in the diagnosis of influenza from Nasopharyngeal swab specimens and should not be used as a sole  basis for treatment. Nasal washings and aspirates are unacceptable for Xpert Xpress SARS-CoV-2/FLU/RSV testing.  Fact Sheet for Patients: bloggercourse.com  Fact Sheet for Healthcare Providers: seriousbroker.it  This test is not yet approved or cleared by the [place]  FDA and has been authorized for detection and/or diagnosis of SARS-CoV-2 by FDA under an Emergency Use Authorization (EUA). This EUA will remain in effect (meaning this test can be used) for the duration of the COVID-19 declaration under Section 564(b)(1) of the Act, 21 U.S.C. section 360bbb-3(b)(1), unless the authorization is terminated or revoked.     Resp Syncytial Virus by PCR NEGATIVE NEGATIVE Final    Comment: (NOTE) Fact Sheet for Patients: bloggercourse.com  Fact Sheet for Healthcare Providers: seriousbroker.it  This test is not yet approved or cleared by the [place]  FDA and has been authorized for detection and/or diagnosis of SARS-CoV-2 by FDA under an Emergency Use Authorization (EUA). This EUA will remain in effect (meaning this test can be used) for the duration of the COVID-19 declaration under Section 564(b)(1) of the Act, 21 U.S.C. section 360bbb-3(b)(1),  unless the authorization is terminated or revoked.  Performed at [facility], [address], [place], [place] [address]   Blood Culture (routine x 2)     Status: None   Collection Time: 03/17/24  2:07 PM   Specimen: BLOOD  Result Value Ref Range Status   Specimen Description BLOOD LEFT ANTECUBITAL  Final   Special Requests   Final    BOTTLES DRAWN AEROBIC AND ANAEROBIC Blood Culture adequate volume   Culture   Final    NO GROWTH 5 DAYS Performed at [facility], [address] [place], [place] [address]    Report Status 03/22/2024 FINAL  Final  Blood Culture (routine x 2)     Status: Abnormal   Collection Time:  03/17/24  2:50 PM   Specimen: Right Antecubital; Blood  Result Value Ref Range Status   Specimen Description RIGHT ANTECUBITAL BLOOD  Final   Special Requests   Final    BOTTLES DRAWN AEROBIC AND ANAEROBIC Blood Culture adequate volume   Culture  Setup Time   Final    GRAM POSITIVE COCCI AEROBIC BOTTLE ONLY CRITICAL RESULT CALLED TO, READ BACK BY AND VERIFIED WITH: PHARMD LISA C. [id number] AT 1757, ADC    Culture (A)  Final    STAPHYLOCOCCUS HOMINIS THE SIGNIFICANCE OF ISOLATING THIS ORGANISM FROM A SINGLE SET OF BLOOD CULTURES WHEN MULTIPLE SETS ARE DRAWN IS UNCERTAIN. PLEASE NOTIFY THE MICROBIOLOGY DEPARTMENT WITHIN ONE WEEK IF SPECIATION AND SENSITIVITIES ARE REQUIRED. Performed at [facility], [address], [place], [place] [address]    Report Status 03/19/2024 FINAL  Final  Blood Culture ID Panel (Reflexed)     Status: Abnormal   Collection Time: 03/17/24  2:50 PM  Result Value Ref Range Status   Enterococcus faecalis NOT DETECTED NOT DETECTED Final   Enterococcus Faecium NOT DETECTED NOT DETECTED Final   Listeria monocytogenes NOT DETECTED NOT DETECTED Final   Staphylococcus species DETECTED (A) NOT DETECTED Final    Comment: CRITICAL RESULT CALLED TO, READ BACK BY AND VERIFIED WITH: PHARMD LISA C. [id number] AT 1757, ADC    Staphylococcus aureus (BCID) NOT DETECTED NOT DETECTED Final   Staphylococcus epidermidis NOT DETECTED NOT DETECTED Final   Staphylococcus lugdunensis NOT DETECTED NOT DETECTED Final   Streptococcus species NOT DETECTED NOT DETECTED Final   Streptococcus agalactiae NOT DETECTED NOT DETECTED Final   Streptococcus pneumoniae NOT DETECTED NOT DETECTED Final   Streptococcus pyogenes NOT DETECTED NOT DETECTED Final   A.calcoaceticus-baumannii NOT DETECTED NOT DETECTED Final   Bacteroides fragilis NOT DETECTED NOT DETECTED Final   Enterobacterales NOT DETECTED NOT DETECTED Final   Enterobacter cloacae complex NOT DETECTED NOT DETECTED Final   Escherichia  coli NOT DETECTED NOT DETECTED Final   Klebsiella aerogenes NOT DETECTED NOT DETECTED Final   Klebsiella oxytoca NOT DETECTED NOT DETECTED Final   Klebsiella pneumoniae NOT DETECTED NOT DETECTED Final   Proteus species NOT DETECTED NOT DETECTED Final   Salmonella species NOT DETECTED NOT DETECTED Final   Serratia marcescens NOT DETECTED NOT DETECTED Final   Haemophilus influenzae NOT DETECTED NOT DETECTED Final   Neisseria meningitidis NOT DETECTED NOT DETECTED Final   Pseudomonas aeruginosa NOT DETECTED NOT DETECTED Final   Stenotrophomonas maltophilia NOT DETECTED NOT DETECTED Final   Candida albicans NOT DETECTED NOT DETECTED Final   Candida auris NOT DETECTED NOT DETECTED Final   Candida glabrata NOT DETECTED NOT DETECTED Final   Candida krusei NOT DETECTED NOT DETECTED Final  Candida parapsilosis NOT DETECTED NOT DETECTED Final   Candida tropicalis NOT DETECTED NOT DETECTED Final   Cryptococcus neoformans/gattii NOT DETECTED NOT DETECTED Final    Comment: Performed at [facility], [address] [place], [place] [address]    Labs: CBC: Recent Labs  Lab 03/29/24 0532 03/31/24 0409  WBC 11.5* 9.0  HGB 13.0 11.6*  HCT 39.9 36.8  MCV 88.7 90.2  PLT 475* 399   Basic Metabolic Panel: Recent Labs  Lab 03/29/24 0532 03/30/24 0342 03/31/24 0409  NA 136 137 136  K 4.1 4.7 4.4  CL 104 102 101  CO2 22 28 29   GLUCOSE 120* 111* 112*  BUN 22 30* 31*  CREATININE 0.78 1.22* 0.91  CALCIUM 8.9 8.9 9.1   Liver Function Tests: Recent Labs  Lab 03/29/24 0532 03/30/24 0342  AST 17 14*  ALT 28 21  ALKPHOS 56 49  BILITOT 0.8 0.2  PROT 5.7* 5.4*  ALBUMIN 2.8* 2.4*   CBG: No results for input(s): GLUCAP in the last 168 hours.  Discharge time spent: 35 minutes.  Length of inpatient stay: 0 days  Signed: Carliss LELON Canales, DO [facility] 03/31/2024

## 2024-03-31 NOTE — Plan of Care (Signed)

## 2024-03-31 NOTE — Progress Notes (Signed)
 Reviewed AVS, patient expressed understanding of medications, MD follow up reviewed.   Removed IV, Site clean, dry and intact.  See LDA for information on wounds at discharge. CCMD contacted and informed patients is being discharged.  Patient states all belongings brought to the hospital at time of admission are accounted for and packed to take home.  Picked up medications from [facility] pharmacy. Nursing Staff contacted to transport patient to entrance A, where family member was waiting in vehicle to transport home.

## 2024-03-31 NOTE — Progress Notes (Signed)
 SATURATION QUALIFICATIONS: (This note is used to comply with regulatory documentation for home oxygen)  Patient Saturations on Room Air at Rest = 93%  Patient Saturations on Room Air while Ambulating = 91%  Patient Saturations on 2 Liters of oxygen while Ambulating = 95%  Please briefly explain why patient needs home oxygen:

## 2024-04-01 LAB — CANCER ANTIGEN 19-9: CA 19-9: 29 U/mL (ref 0–35)

## 2024-04-10 ENCOUNTER — Ambulatory Visit: Payer: Self-pay | Admitting: Gastroenterology

## 2024-04-10 NOTE — Progress Notes (Signed)
 Ms. Overstreet,  Your tumor marker for pancreatic cancer was not elevated.  This is reassuring.  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  Team,  Please book routine follow up visit with me

## 2024-04-26 ENCOUNTER — Other Ambulatory Visit: Payer: Self-pay

## 2024-04-26 DIAGNOSIS — C3492 Malignant neoplasm of unspecified part of left bronchus or lung: Secondary | ICD-10-CM

## 2024-05-01 ENCOUNTER — Encounter (HOSPITAL_COMMUNITY)
Admission: RE | Admit: 2024-05-01 | Discharge: 2024-05-01 | Disposition: A | Discharge status: Home / Self Care | Source: Ambulatory Visit

## 2024-05-01 DIAGNOSIS — I2510 Atherosclerotic heart disease of native coronary artery without angina pectoris: Secondary | ICD-10-CM | POA: Diagnosis not present

## 2024-05-01 DIAGNOSIS — I700 Atherosclerosis of aorta: Secondary | ICD-10-CM | POA: Diagnosis not present

## 2024-05-01 DIAGNOSIS — C3492 Malignant neoplasm of unspecified part of left bronchus or lung: Secondary | ICD-10-CM | POA: Diagnosis present

## 2024-05-01 DIAGNOSIS — R918 Other nonspecific abnormal finding of lung field: Secondary | ICD-10-CM | POA: Diagnosis not present

## 2024-05-01 DIAGNOSIS — C3432 Malignant neoplasm of lower lobe, left bronchus or lung: Secondary | ICD-10-CM | POA: Diagnosis not present

## 2024-05-01 DIAGNOSIS — E041 Nontoxic single thyroid nodule: Secondary | ICD-10-CM | POA: Diagnosis not present

## 2024-05-01 LAB — GLUCOSE, CAPILLARY: Glucose-Capillary: 124 mg/dL — ABNORMAL HIGH (ref 70–99)

## 2024-05-01 MED ORDER — FLUDEOXYGLUCOSE F - 18 (FDG) INJECTION
5.7500 | Freq: Once | INTRAVENOUS | Status: AC | PRN
  Administered 2024-05-01: 5.44 via INTRAVENOUS

## 2024-05-05 ENCOUNTER — Ambulatory Visit: Payer: Self-pay

## 2024-05-08 NOTE — Telephone Encounter (Signed)
 Copied from CRM #[id number]. Topic: Clinical - Lab/Test Results >> May 08, 2024 11:12 AM Corean SAUNDERS wrote: Reason for CRM: Please call patients son Yehudis Monceaux back to to discuss PET scan results at  [phone] - Mancel states if possible, please send through MyChart. >> May 08, 2024 12:06 PM Rilla B wrote: Please call give son, Mancel a call to discuss test results [phone]    Per son's request, sending results through MyChart. NFN.

## 2024-05-09 ENCOUNTER — Telehealth: Payer: Self-pay

## 2024-05-09 NOTE — Telephone Encounter (Signed)
 Copied from CRM #[id number]. Topic: Clinical - Lab/Test Results >> May 08, 2024  5:28 PM Joesph PARAS wrote: Reason for CRM: Provided PET/CT results verbatim to patient's son, no questions at this time.    Nothing further needed

## 2024-05-11 ENCOUNTER — Ambulatory Visit

## 2024-05-11 DIAGNOSIS — J4540 Moderate persistent asthma, uncomplicated: Secondary | ICD-10-CM

## 2024-05-12 NOTE — Progress Notes (Signed)
 Spke to pt's son over the phone regarding PET/CT results.pt/family will discuss further with her oncologist.

## 2024-06-14 ENCOUNTER — Ambulatory Visit: Admitting: Gastroenterology
